# Patient Record
Sex: Female | Born: 1955 | Race: Black or African American | Hispanic: No | Marital: Single | Smoking: Never smoker
Health system: Southern US, Community
[De-identification: ages and names within clinical notes are randomized; demographics above are authoritative.]

## PROBLEM LIST (undated history)

## (undated) DIAGNOSIS — L83 Acanthosis nigricans: Secondary | ICD-10-CM

## (undated) DIAGNOSIS — K5900 Constipation, unspecified: Secondary | ICD-10-CM

## (undated) DIAGNOSIS — F329 Major depressive disorder, single episode, unspecified: Secondary | ICD-10-CM

## (undated) DIAGNOSIS — F3289 Other specified depressive episodes: Secondary | ICD-10-CM

## (undated) DIAGNOSIS — E119 Type 2 diabetes mellitus without complications: Secondary | ICD-10-CM

## (undated) DIAGNOSIS — R5382 Chronic fatigue, unspecified: Secondary | ICD-10-CM

## (undated) DIAGNOSIS — D508 Other iron deficiency anemias: Secondary | ICD-10-CM

## (undated) DIAGNOSIS — R739 Hyperglycemia, unspecified: Secondary | ICD-10-CM

## (undated) DIAGNOSIS — M329 Systemic lupus erythematosus, unspecified: Secondary | ICD-10-CM

## (undated) DIAGNOSIS — B372 Candidiasis of skin and nail: Secondary | ICD-10-CM

## (undated) DIAGNOSIS — K111 Hypertrophy of salivary gland: Secondary | ICD-10-CM

## (undated) DIAGNOSIS — M359 Systemic involvement of connective tissue, unspecified: Secondary | ICD-10-CM

## (undated) DIAGNOSIS — R609 Edema, unspecified: Principal | ICD-10-CM

## (undated) DIAGNOSIS — M6289 Other specified disorders of muscle: Secondary | ICD-10-CM

## (undated) DIAGNOSIS — D568 Other thalassemias: Secondary | ICD-10-CM

## (undated) DIAGNOSIS — R5381 Other malaise: Secondary | ICD-10-CM

## (undated) DIAGNOSIS — R5383 Other fatigue: Secondary | ICD-10-CM

## (undated) DIAGNOSIS — IMO0001 Reserved for inherently not codable concepts without codable children: Secondary | ICD-10-CM

## (undated) DIAGNOSIS — K219 Gastro-esophageal reflux disease without esophagitis: Secondary | ICD-10-CM

## (undated) DIAGNOSIS — M3500 Sicca syndrome, unspecified: Secondary | ICD-10-CM

## (undated) DIAGNOSIS — G43109 Migraine with aura, not intractable, without status migrainosus: Secondary | ICD-10-CM

## (undated) DIAGNOSIS — K6389 Other specified diseases of intestine: Secondary | ICD-10-CM

## (undated) DIAGNOSIS — F3181 Bipolar II disorder: Secondary | ICD-10-CM

## (undated) DIAGNOSIS — I10 Essential (primary) hypertension: Secondary | ICD-10-CM

## (undated) DIAGNOSIS — F319 Bipolar disorder, unspecified: Secondary | ICD-10-CM

## (undated) DIAGNOSIS — D649 Anemia, unspecified: Secondary | ICD-10-CM

## (undated) DIAGNOSIS — D473 Essential (hemorrhagic) thrombocythemia: Secondary | ICD-10-CM

## (undated) DIAGNOSIS — E785 Hyperlipidemia, unspecified: Secondary | ICD-10-CM

## (undated) DIAGNOSIS — IMO0002 Reserved for concepts with insufficient information to code with codable children: Secondary | ICD-10-CM

## (undated) DIAGNOSIS — E559 Vitamin D deficiency, unspecified: Secondary | ICD-10-CM

## (undated) DIAGNOSIS — R768 Other specified abnormal immunological findings in serum: Secondary | ICD-10-CM

## (undated) DIAGNOSIS — K3184 Gastroparesis: Secondary | ICD-10-CM

## (undated) DIAGNOSIS — E042 Nontoxic multinodular goiter: Secondary | ICD-10-CM

## (undated) DIAGNOSIS — Z803 Family history of malignant neoplasm of breast: Secondary | ICD-10-CM

## (undated) DIAGNOSIS — G9332 Myalgic encephalomyelitis/chronic fatigue syndrome: Secondary | ICD-10-CM

## (undated) DIAGNOSIS — G4730 Sleep apnea, unspecified: Secondary | ICD-10-CM

## (undated) DIAGNOSIS — L930 Discoid lupus erythematosus: Secondary | ICD-10-CM

## (undated) DIAGNOSIS — D509 Iron deficiency anemia, unspecified: Secondary | ICD-10-CM

## (undated) DIAGNOSIS — R109 Unspecified abdominal pain: Secondary | ICD-10-CM

## (undated) DIAGNOSIS — Z0000 Encounter for general adult medical examination without abnormal findings: Secondary | ICD-10-CM

## (undated) DIAGNOSIS — N289 Disorder of kidney and ureter, unspecified: Principal | ICD-10-CM

## (undated) HISTORY — DX: Sjogren syndrome, unspecified: M35.00

## (undated) HISTORY — DX: Reserved for inherently not codable concepts without codable children: IMO0001

## (undated) HISTORY — DX: Other specified depressive episodes: F32.89

## (undated) HISTORY — DX: Type 2 diabetes mellitus without complications: E11.9

## (undated) HISTORY — DX: Systemic involvement of connective tissue, unspecified: M35.9

## (undated) HISTORY — DX: Hyperlipidemia, unspecified: E78.5

## (undated) HISTORY — DX: Other thalassemias: D56.8

## (undated) HISTORY — DX: Systemic lupus erythematosus, unspecified: M32.9

## (undated) HISTORY — DX: Gastroparesis: K31.84

## (undated) HISTORY — DX: Disorder of kidney and ureter, unspecified: N28.9

## (undated) HISTORY — DX: Essential (hemorrhagic) thrombocythemia: D47.3

## (undated) HISTORY — DX: Other iron deficiency anemias: D50.8

## (undated) HISTORY — DX: Discoid lupus erythematosus: L93.0

## (undated) HISTORY — DX: Other specified disorders of muscle: M62.89

## (undated) HISTORY — DX: Edema, unspecified: R60.9

## (undated) HISTORY — DX: Bipolar disorder, unspecified: F31.9

## (undated) HISTORY — DX: Vitamin D deficiency, unspecified: E55.9

## (undated) HISTORY — DX: Hypertrophy of salivary gland: K11.1

## (undated) HISTORY — DX: Constipation, unspecified: K59.00

## (undated) HISTORY — DX: Anemia, unspecified: D64.9

## (undated) HISTORY — DX: Sleep apnea, unspecified: G47.30

## (undated) HISTORY — DX: Other specified diseases of intestine: K63.89

## (undated) HISTORY — DX: Reserved for concepts with insufficient information to code with codable children: IMO0002

## (undated) HISTORY — DX: Other malaise: R53.81

## (undated) HISTORY — DX: Family history of malignant neoplasm of breast: Z80.3

## (undated) HISTORY — DX: Hyperglycemia, unspecified: R73.9

## (undated) HISTORY — DX: Candidiasis of skin and nail: B37.2

## (undated) HISTORY — PX: BIOPSY THYROID: PRO38

## (undated) HISTORY — DX: Gastro-esophageal reflux disease without esophagitis: K21.9

## (undated) HISTORY — DX: Major depressive disorder, single episode, unspecified: F32.9

## (undated) HISTORY — DX: Migraine with aura, not intractable, without status migrainosus: G43.109

## (undated) HISTORY — DX: Essential (primary) hypertension: I10

## (undated) HISTORY — DX: Nontoxic multinodular goiter: E04.2

## (undated) HISTORY — DX: Bipolar II disorder: F31.81

## (undated) HISTORY — DX: Acanthosis nigricans: L83

## (undated) HISTORY — DX: Other fatigue: R53.83

## (undated) HISTORY — DX: Encounter for general adult medical examination without abnormal findings: Z00.00

## (undated) HISTORY — DX: Unspecified abdominal pain: R10.9

## (undated) HISTORY — DX: Chronic fatigue, unspecified: R53.82

## (undated) HISTORY — DX: Other specified abnormal immunological findings in serum: R76.8

## (undated) HISTORY — DX: Myalgic encephalomyelitis/chronic fatigue syndrome: G93.32

## (undated) HISTORY — DX: Iron deficiency anemia, unspecified: D50.9

---

## 1998-09-26 ENCOUNTER — Encounter: Admission: RE | Admit: 1998-09-26 | Discharge: 1998-12-25 | Payer: Self-pay | Admitting: Internal Medicine

## 1999-05-03 ENCOUNTER — Other Ambulatory Visit: Admission: RE | Admit: 1999-05-03 | Discharge: 1999-05-03 | Payer: Self-pay | Admitting: Obstetrics and Gynecology

## 1999-11-07 ENCOUNTER — Ambulatory Visit (HOSPITAL_BASED_OUTPATIENT_CLINIC_OR_DEPARTMENT_OTHER): Admission: RE | Admit: 1999-11-07 | Discharge: 1999-11-07 | Payer: Self-pay | Admitting: General Surgery

## 2000-01-26 ENCOUNTER — Encounter: Admission: RE | Admit: 2000-01-26 | Discharge: 2000-01-26 | Payer: Self-pay | Admitting: Family Medicine

## 2000-01-26 ENCOUNTER — Encounter: Payer: Self-pay | Admitting: Family Medicine

## 2000-04-21 ENCOUNTER — Ambulatory Visit (HOSPITAL_BASED_OUTPATIENT_CLINIC_OR_DEPARTMENT_OTHER): Admission: RE | Admit: 2000-04-21 | Discharge: 2000-04-21 | Payer: Self-pay | Admitting: Family Medicine

## 2000-06-03 ENCOUNTER — Other Ambulatory Visit: Admission: RE | Admit: 2000-06-03 | Discharge: 2000-06-03 | Payer: Self-pay | Admitting: Obstetrics and Gynecology

## 2000-06-27 ENCOUNTER — Ambulatory Visit (HOSPITAL_BASED_OUTPATIENT_CLINIC_OR_DEPARTMENT_OTHER): Admission: RE | Admit: 2000-06-27 | Discharge: 2000-06-27 | Payer: Self-pay | Admitting: Family Medicine

## 2000-07-03 ENCOUNTER — Encounter: Payer: Self-pay | Admitting: Obstetrics and Gynecology

## 2000-07-03 ENCOUNTER — Ambulatory Visit (HOSPITAL_COMMUNITY): Admission: RE | Admit: 2000-07-03 | Discharge: 2000-07-03 | Payer: Self-pay | Admitting: Obstetrics and Gynecology

## 2000-11-15 ENCOUNTER — Encounter: Payer: Self-pay | Admitting: Family Medicine

## 2000-11-15 ENCOUNTER — Ambulatory Visit (HOSPITAL_COMMUNITY): Admission: RE | Admit: 2000-11-15 | Discharge: 2000-11-15 | Payer: Self-pay | Admitting: Family Medicine

## 2001-01-27 ENCOUNTER — Encounter: Payer: Self-pay | Admitting: Family Medicine

## 2001-01-27 ENCOUNTER — Encounter: Admission: RE | Admit: 2001-01-27 | Discharge: 2001-01-27 | Payer: Self-pay | Admitting: Family Medicine

## 2001-12-31 ENCOUNTER — Other Ambulatory Visit: Admission: RE | Admit: 2001-12-31 | Discharge: 2001-12-31 | Payer: Self-pay | Admitting: Obstetrics and Gynecology

## 2002-01-28 ENCOUNTER — Encounter: Payer: Self-pay | Admitting: Obstetrics and Gynecology

## 2002-01-28 ENCOUNTER — Encounter: Admission: RE | Admit: 2002-01-28 | Discharge: 2002-01-28 | Payer: Self-pay | Admitting: Obstetrics and Gynecology

## 2002-08-13 HISTORY — PX: LAPAROSCOPIC TOTAL HYSTERECTOMY: SUR800

## 2003-01-21 ENCOUNTER — Encounter (INDEPENDENT_AMBULATORY_CARE_PROVIDER_SITE_OTHER): Payer: Self-pay

## 2003-01-21 ENCOUNTER — Inpatient Hospital Stay (HOSPITAL_COMMUNITY): Admission: RE | Admit: 2003-01-21 | Discharge: 2003-01-23 | Payer: Self-pay | Admitting: Obstetrics and Gynecology

## 2003-02-04 ENCOUNTER — Encounter: Payer: Self-pay | Admitting: Obstetrics and Gynecology

## 2003-02-04 ENCOUNTER — Encounter: Admission: RE | Admit: 2003-02-04 | Discharge: 2003-02-04 | Payer: Self-pay | Admitting: Obstetrics and Gynecology

## 2003-02-08 ENCOUNTER — Encounter: Admission: RE | Admit: 2003-02-08 | Discharge: 2003-02-08 | Payer: Self-pay | Admitting: Obstetrics and Gynecology

## 2003-02-08 ENCOUNTER — Encounter: Payer: Self-pay | Admitting: Obstetrics and Gynecology

## 2004-02-10 ENCOUNTER — Other Ambulatory Visit: Admission: RE | Admit: 2004-02-10 | Discharge: 2004-02-10 | Payer: Self-pay | Admitting: Obstetrics and Gynecology

## 2004-02-25 ENCOUNTER — Encounter: Admission: RE | Admit: 2004-02-25 | Discharge: 2004-02-25 | Payer: Self-pay | Admitting: Obstetrics and Gynecology

## 2005-01-31 ENCOUNTER — Encounter: Admission: RE | Admit: 2005-01-31 | Discharge: 2005-01-31 | Payer: Self-pay | Admitting: Neurology

## 2005-03-06 ENCOUNTER — Encounter: Admission: RE | Admit: 2005-03-06 | Discharge: 2005-03-06 | Payer: Self-pay | Admitting: Obstetrics and Gynecology

## 2005-10-01 ENCOUNTER — Encounter: Payer: Self-pay | Admitting: Family Medicine

## 2005-10-01 LAB — CONVERTED CEMR LAB
Alkaline Phosphatase: 141 units/L
Basophils Absolute: 0.1 10*3/uL
Basophils Relative: 1.2 %
Cholesterol: 168 mg/dL
Creatinine, Ser: 0.9 mg/dL
Direct LDL: 114 mg/dL
Glucose, Bld: 97 mg/dL
HDL: 40 mg/dL
Hemoglobin: 11.4 g/dL
Lymphocytes Relative: 30.3 %
Monocytes Absolute: 0.7 10*3/uL
Neutro Abs: 5.2 10*3/uL
Neutrophils Relative %: 59.5 %
Platelets: 526 10*3/uL
Potassium: 4.1 meq/L
RDW: 16.8 %
Sed Rate: 46 mm/hr
Sodium: 140 meq/L
Total Bilirubin: 0.6 mg/dL
Total CHOL/HDL Ratio: 4.2
Total Protein: 6.8 g/dL

## 2006-02-14 ENCOUNTER — Ambulatory Visit: Payer: Self-pay | Admitting: Hematology & Oncology

## 2006-02-25 LAB — CBC & DIFF AND RETIC
BASO%: 0.3 % (ref 0.0–2.0)
HCT: 34.7 % — ABNORMAL LOW (ref 34.8–46.6)
IRF: 0.36 — ABNORMAL HIGH (ref 0.130–0.330)
MCHC: 32.2 g/dL (ref 32.0–36.0)
MONO#: 0.5 10*3/uL (ref 0.1–0.9)
NEUT#: 5.7 10*3/uL (ref 1.5–6.5)
RBC: 4.64 10*6/uL (ref 3.70–5.32)
Retic %: 1.3 % (ref 0.4–2.3)
WBC: 8.7 10*3/uL (ref 3.9–10.0)
lymph#: 2.3 10*3/uL (ref 0.9–3.3)

## 2006-02-25 LAB — CHCC SMEAR

## 2006-02-27 LAB — FERRITIN: Ferritin: 28 ng/mL (ref 10–291)

## 2006-03-07 ENCOUNTER — Encounter: Admission: RE | Admit: 2006-03-07 | Discharge: 2006-03-07 | Payer: Self-pay | Admitting: Obstetrics and Gynecology

## 2006-04-12 ENCOUNTER — Ambulatory Visit: Payer: Self-pay | Admitting: Hematology & Oncology

## 2006-04-17 LAB — CBC & DIFF AND RETIC
BASO%: 0.4 % (ref 0.0–2.0)
HCT: 33.8 % — ABNORMAL LOW (ref 34.8–46.6)
LYMPH%: 27.7 % (ref 14.0–48.0)
MCH: 25 pg — ABNORMAL LOW (ref 26.0–34.0)
MCHC: 32.5 g/dL (ref 32.0–36.0)
MCV: 76.8 fL — ABNORMAL LOW (ref 81.0–101.0)
MONO#: 0.5 10*3/uL (ref 0.1–0.9)
MONO%: 5.6 % (ref 0.0–13.0)
NEUT%: 64.3 % (ref 39.6–76.8)
Platelets: 517 10*3/uL — ABNORMAL HIGH (ref 145–400)
WBC: 8.4 10*3/uL (ref 3.9–10.0)

## 2006-04-17 LAB — CHCC SMEAR

## 2006-04-20 LAB — FERRITIN: Ferritin: 544 ng/mL — ABNORMAL HIGH (ref 10–291)

## 2006-08-23 ENCOUNTER — Encounter: Admission: RE | Admit: 2006-08-23 | Discharge: 2006-08-23 | Payer: Self-pay | Admitting: Orthopedic Surgery

## 2006-11-07 ENCOUNTER — Encounter: Payer: Self-pay | Admitting: Family Medicine

## 2006-11-07 LAB — CONVERTED CEMR LAB
ALT: 23 units/L
Albumin: 3.7 g/dL
BUN: 6 mg/dL
Basophils Absolute: 0.1 10*3/uL
CO2: 33 meq/L
Calcium: 9.6 mg/dL
Chloride: 104 meq/L
Eosinophils Relative: 1.4 %
Glucose, Bld: 129 mg/dL
Hgb A1c MFr Bld: 6.7 %
Lymphocytes Relative: 30.2 %
Neutro Abs: 4.5 10*3/uL
Neutrophils Relative %: 63.1 %
Platelets: 467 10*3/uL
RDW: 14.6 %
Total Protein: 6.5 g/dL

## 2007-02-01 ENCOUNTER — Encounter: Payer: Self-pay | Admitting: Family Medicine

## 2007-02-05 ENCOUNTER — Encounter: Payer: Self-pay | Admitting: Family Medicine

## 2007-02-05 ENCOUNTER — Encounter: Admission: RE | Admit: 2007-02-05 | Discharge: 2007-02-05 | Payer: Self-pay | Admitting: Orthopedic Surgery

## 2007-02-06 ENCOUNTER — Encounter: Payer: Self-pay | Admitting: Family Medicine

## 2007-02-11 ENCOUNTER — Encounter: Admission: RE | Admit: 2007-02-11 | Discharge: 2007-02-11 | Payer: Self-pay | Admitting: Family Medicine

## 2007-02-12 ENCOUNTER — Encounter: Payer: Self-pay | Admitting: Family Medicine

## 2007-03-11 ENCOUNTER — Encounter: Admission: RE | Admit: 2007-03-11 | Discharge: 2007-03-11 | Payer: Self-pay | Admitting: Obstetrics and Gynecology

## 2007-04-01 ENCOUNTER — Encounter: Payer: Self-pay | Admitting: Family Medicine

## 2007-04-01 ENCOUNTER — Other Ambulatory Visit: Admission: RE | Admit: 2007-04-01 | Discharge: 2007-04-01 | Payer: Self-pay | Admitting: Interventional Radiology

## 2007-04-01 ENCOUNTER — Encounter: Admission: RE | Admit: 2007-04-01 | Discharge: 2007-04-01 | Payer: Self-pay | Admitting: Surgery

## 2007-04-01 ENCOUNTER — Encounter (INDEPENDENT_AMBULATORY_CARE_PROVIDER_SITE_OTHER): Payer: Self-pay | Admitting: Interventional Radiology

## 2007-04-10 ENCOUNTER — Encounter: Payer: Self-pay | Admitting: Family Medicine

## 2007-04-10 LAB — CONVERTED CEMR LAB
ALT: 22 units/L
AST: 19 units/L
CO2: 30 meq/L
Chloride: 105 meq/L
GFR calc Af Amer: 75.93 mL/min
Glucose, Bld: 119 mg/dL
Potassium: 4.7 meq/L
Total Bilirubin: 0.7 mg/dL
Total Protein: 6.6 g/dL

## 2007-04-16 ENCOUNTER — Encounter: Payer: Self-pay | Admitting: Family Medicine

## 2007-08-15 ENCOUNTER — Encounter: Payer: Self-pay | Admitting: Family Medicine

## 2007-08-15 ENCOUNTER — Encounter: Admission: RE | Admit: 2007-08-15 | Discharge: 2007-08-15 | Payer: Self-pay | Admitting: Family Medicine

## 2007-10-08 ENCOUNTER — Encounter: Payer: Self-pay | Admitting: Family Medicine

## 2007-10-08 LAB — CONVERTED CEMR LAB
ALT: 20 units/L
AST: 16 units/L
Anion Gap: 14.6
Basophils Absolute: 0.1 10*3/uL
Basophils Relative: 0.7 %
CO2: 30 meq/L
Calcium: 9.6 mg/dL
Chloride: 103 meq/L
Eosinophils Relative: 1.5 %
GFR calc Af Amer: 88.57 mL/min
Glucose, Bld: 111 mg/dL
Hemoglobin: 12.6 g/dL
MCHC: 32.4 g/dL
Monocytes Absolute: 0.4 10*3/uL
Neutro Abs: 5.1 10*3/uL
Potassium: 4.6 meq/L
RDW: 14.6 %
TSH: 1.03 microintl units/mL
Total CHOL/HDL Ratio: 4.09
Total Protein: 6.6 g/dL

## 2007-12-24 ENCOUNTER — Encounter: Payer: Self-pay | Admitting: Family Medicine

## 2008-01-28 ENCOUNTER — Encounter: Payer: Self-pay | Admitting: Family Medicine

## 2008-01-28 LAB — CONVERTED CEMR LAB
AST: 16 units/L
Calcium: 9.3 mg/dL
Chloride: 106 meq/L
Direct LDL: 134 mg/dL
GFR calc Af Amer: 105.82 mL/min
GFR calc non Af Amer: 128.04 mL/min
Glucose, Bld: 121 mg/dL
HDL: 52 mg/dL
Hgb A1c MFr Bld: 6.7 %
Potassium: 4.8 meq/L
Sodium: 143 meq/L
Total Bilirubin: 0.7 mg/dL
Total CHOL/HDL Ratio: 3.87

## 2008-02-17 ENCOUNTER — Encounter: Payer: Self-pay | Admitting: Family Medicine

## 2008-02-17 LAB — HM COLONOSCOPY

## 2008-03-11 ENCOUNTER — Encounter: Admission: RE | Admit: 2008-03-11 | Discharge: 2008-03-11 | Payer: Self-pay | Admitting: Obstetrics and Gynecology

## 2008-03-12 LAB — HM COLONOSCOPY

## 2008-03-19 ENCOUNTER — Encounter: Admission: RE | Admit: 2008-03-19 | Discharge: 2008-03-19 | Payer: Self-pay | Admitting: Obstetrics and Gynecology

## 2008-04-28 ENCOUNTER — Encounter: Payer: Self-pay | Admitting: Family Medicine

## 2008-04-28 LAB — CONVERTED CEMR LAB
BUN: 10 mg/dL
Basophils Absolute: 0.1 10*3/uL
Calcium: 9.3 mg/dL
Chloride: 102 meq/L
Cholesterol: 170 mg/dL
Direct LDL: 112 mg/dL
Eosinophils Relative: 1.6 %
GFR calc Af Amer: 75.62 mL/min
GFR calc non Af Amer: 91.5 mL/min
Glucose, Bld: 104 mg/dL
HCT: 37.2 %
HDL: 49 mg/dL
Hemoglobin: 11.9 g/dL
Hgb A1c MFr Bld: 7.1 %
Lymphocytes Relative: 27.8 %
Lymphs Abs: 2.8 10*3/uL
Monocytes Absolute: 0.6 10*3/uL
Monocytes Relative: 5.7 %
Neutro Abs: 6.5 10*3/uL
RBC: 4.7 M/uL
Total Bilirubin: 0.6 mg/dL
Total CHOL/HDL Ratio: 3.47
WBC: 10.2 10*3/uL

## 2008-08-10 ENCOUNTER — Encounter: Payer: Self-pay | Admitting: Family Medicine

## 2008-08-10 LAB — CONVERTED CEMR LAB
AST: 14 units/L
Albumin: 3.9 g/dL
Alkaline Phosphatase: 137 units/L
Anion Gap: 12.5
Basophils Relative: 0.4 %
CO2: 29 meq/L
Eosinophils Absolute: 0.3 10*3/uL
MCHC: 31.9 g/dL
MCV: 79.6 fL
Monocytes Relative: 5.5 %
Neutrophils Relative %: 67.4 %
Platelets: 416 10*3/uL
Potassium: 4.5 meq/L
Sodium: 141 meq/L
Total Protein: 6.8 g/dL
Triglycerides: 192 mg/dL

## 2008-08-24 ENCOUNTER — Encounter: Payer: Self-pay | Admitting: Family Medicine

## 2008-08-24 ENCOUNTER — Encounter: Admission: RE | Admit: 2008-08-24 | Discharge: 2008-08-24 | Payer: Self-pay | Admitting: Family Medicine

## 2008-09-16 ENCOUNTER — Encounter (INDEPENDENT_AMBULATORY_CARE_PROVIDER_SITE_OTHER): Payer: Self-pay | Admitting: Interventional Radiology

## 2008-09-16 ENCOUNTER — Other Ambulatory Visit: Admission: RE | Admit: 2008-09-16 | Discharge: 2008-09-16 | Payer: Self-pay | Admitting: Interventional Radiology

## 2008-09-16 ENCOUNTER — Encounter: Admission: RE | Admit: 2008-09-16 | Discharge: 2008-09-16 | Payer: Self-pay | Admitting: Family Medicine

## 2008-09-16 ENCOUNTER — Encounter: Payer: Self-pay | Admitting: Family Medicine

## 2008-10-26 ENCOUNTER — Encounter: Payer: Self-pay | Admitting: Family Medicine

## 2008-10-26 LAB — CONVERTED CEMR LAB: TSH: 1.16 microintl units/mL

## 2008-11-10 ENCOUNTER — Encounter: Payer: Self-pay | Admitting: Family Medicine

## 2008-11-10 LAB — CONVERTED CEMR LAB
AST: 15 units/L
Anion Gap: 12
BUN: 8 mg/dL
Direct LDL: 131 mg/dL
Glucose, Bld: 100 mg/dL
Hemoglobin: 12.7 g/dL
Potassium: 5 meq/L
Sodium: 141 meq/L
Total Protein: 6.9 g/dL

## 2009-02-08 ENCOUNTER — Encounter: Payer: Self-pay | Admitting: Family Medicine

## 2009-02-08 LAB — CONVERTED CEMR LAB

## 2009-02-16 ENCOUNTER — Encounter: Payer: Self-pay | Admitting: Family Medicine

## 2009-03-08 ENCOUNTER — Encounter: Payer: Self-pay | Admitting: Family Medicine

## 2009-03-10 ENCOUNTER — Encounter: Payer: Self-pay | Admitting: Family Medicine

## 2009-03-12 ENCOUNTER — Encounter: Payer: Self-pay | Admitting: Family Medicine

## 2009-03-14 ENCOUNTER — Encounter: Admission: RE | Admit: 2009-03-14 | Discharge: 2009-03-14 | Payer: Self-pay | Admitting: Obstetrics and Gynecology

## 2009-03-14 ENCOUNTER — Encounter: Payer: Self-pay | Admitting: Family Medicine

## 2009-03-24 ENCOUNTER — Encounter: Payer: Self-pay | Admitting: Family Medicine

## 2009-03-24 ENCOUNTER — Encounter: Admission: RE | Admit: 2009-03-24 | Discharge: 2009-03-24 | Payer: Self-pay | Admitting: Internal Medicine

## 2009-05-23 ENCOUNTER — Encounter: Payer: Self-pay | Admitting: Family Medicine

## 2009-06-07 ENCOUNTER — Encounter: Payer: Self-pay | Admitting: Family Medicine

## 2009-06-07 LAB — CONVERTED CEMR LAB
ALT: 28 units/L
AST: 20 units/L
Microalb, Ur: 2.6 mg/dL
Triglycerides: 187 mg/dL

## 2009-06-28 ENCOUNTER — Encounter: Payer: Self-pay | Admitting: Family Medicine

## 2009-06-28 LAB — CONVERTED CEMR LAB
Glucose, Bld: 137 mg/dL
Microalb, Ur: 2.6 mg/dL

## 2009-07-13 ENCOUNTER — Encounter: Payer: Self-pay | Admitting: Family Medicine

## 2009-08-01 ENCOUNTER — Ambulatory Visit (HOSPITAL_COMMUNITY): Admission: RE | Admit: 2009-08-01 | Discharge: 2009-08-01 | Payer: Self-pay | Admitting: Rheumatology

## 2009-08-01 ENCOUNTER — Encounter: Payer: Self-pay | Admitting: Family Medicine

## 2009-08-10 ENCOUNTER — Encounter: Payer: Self-pay | Admitting: Family Medicine

## 2009-08-10 LAB — CONVERTED CEMR LAB
Albumin: 3.9 g/dL
Alkaline Phosphatase: 137 units/L
Anion Gap: 12.5
Basophils Absolute: 0 10*3/uL
Basophils Relative: 0.4 %
CO2: 29 meq/L
Cholesterol: 221 mg/dL
Creatinine, Ser: 0.7 mg/dL
Eosinophils Absolute: 0.3 10*3/uL
MCHC: 31.9 g/dL
MCV: 79.6 fL
Neutrophils Relative %: 67.4 %
Platelets: 416 10*3/uL
RDW: 15.1 %
Sodium: 141 meq/L
Total Protein: 6.8 g/dL
Triglycerides: 192 mg/dL
WBC: 8.6 10*3/uL

## 2009-08-13 DIAGNOSIS — K111 Hypertrophy of salivary gland: Secondary | ICD-10-CM

## 2009-08-13 HISTORY — DX: Hypertrophy of salivary gland: K11.1

## 2009-08-31 ENCOUNTER — Encounter: Payer: Self-pay | Admitting: Family Medicine

## 2009-08-31 LAB — CONVERTED CEMR LAB
Hgb A1c MFr Bld: 6.4 %
Hgb A1c MFr Bld: 6.4 %
Microalb, Ur: 1.4 mg/dL

## 2009-09-09 ENCOUNTER — Encounter: Payer: Self-pay | Admitting: Family Medicine

## 2009-09-09 LAB — CONVERTED CEMR LAB: Microalb, Ur: 1.4 mg/dL

## 2009-09-28 ENCOUNTER — Encounter: Payer: Self-pay | Admitting: Family Medicine

## 2009-09-28 LAB — HM DIABETES EYE EXAM: HM Diabetic Eye Exam: NORMAL

## 2009-10-13 ENCOUNTER — Encounter: Payer: Self-pay | Admitting: Family Medicine

## 2009-11-17 ENCOUNTER — Encounter: Payer: Self-pay | Admitting: Family Medicine

## 2009-11-24 ENCOUNTER — Encounter: Payer: Self-pay | Admitting: Family Medicine

## 2009-12-22 ENCOUNTER — Encounter: Payer: Self-pay | Admitting: Family Medicine

## 2009-12-22 LAB — CONVERTED CEMR LAB
AST: 14 units/L
Alkaline Phosphatase: 125 units/L
Calcium: 9.3 mg/dL
Chloride: 101 meq/L
Creatinine, Ser: 0.7 mg/dL
Direct LDL: 95 mg/dL
GFR calc Af Amer: 106.33 mL/min
GFR calc non Af Amer: 87.87 mL/min
HDL: 37 mg/dL
Hgb A1c MFr Bld: 5.7 %
Microalb, Ur: 3.1 mg/dL
Microalb, Ur: 3.1 mg/dL
Total CHOL/HDL Ratio: 4.05
Total CHOL/HDL Ratio: 4.05
Total Protein: 6.7 g/dL
Triglycerides: 107 mg/dL

## 2010-01-10 ENCOUNTER — Encounter: Payer: Self-pay | Admitting: Family Medicine

## 2010-01-11 ENCOUNTER — Encounter: Payer: Self-pay | Admitting: Family Medicine

## 2010-01-12 ENCOUNTER — Encounter: Payer: Self-pay | Admitting: Family Medicine

## 2010-01-18 ENCOUNTER — Encounter: Payer: Self-pay | Admitting: Family Medicine

## 2010-01-18 LAB — CONVERTED CEMR LAB
TSH: 0.95 microintl units/mL
TSH: 0.95 microintl units/mL

## 2010-01-31 ENCOUNTER — Encounter: Payer: Self-pay | Admitting: Family Medicine

## 2010-01-31 DIAGNOSIS — E119 Type 2 diabetes mellitus without complications: Secondary | ICD-10-CM | POA: Insufficient documentation

## 2010-01-31 DIAGNOSIS — D568 Other thalassemias: Secondary | ICD-10-CM

## 2010-01-31 DIAGNOSIS — R5383 Other fatigue: Secondary | ICD-10-CM

## 2010-01-31 DIAGNOSIS — D509 Iron deficiency anemia, unspecified: Secondary | ICD-10-CM

## 2010-01-31 DIAGNOSIS — F3289 Other specified depressive episodes: Secondary | ICD-10-CM | POA: Insufficient documentation

## 2010-01-31 DIAGNOSIS — I10 Essential (primary) hypertension: Secondary | ICD-10-CM | POA: Insufficient documentation

## 2010-01-31 DIAGNOSIS — R5381 Other malaise: Secondary | ICD-10-CM

## 2010-01-31 DIAGNOSIS — IMO0002 Reserved for concepts with insufficient information to code with codable children: Secondary | ICD-10-CM

## 2010-01-31 DIAGNOSIS — D508 Other iron deficiency anemias: Secondary | ICD-10-CM | POA: Insufficient documentation

## 2010-01-31 DIAGNOSIS — L83 Acanthosis nigricans: Secondary | ICD-10-CM

## 2010-01-31 DIAGNOSIS — M797 Fibromyalgia: Secondary | ICD-10-CM

## 2010-01-31 DIAGNOSIS — G4730 Sleep apnea, unspecified: Secondary | ICD-10-CM | POA: Insufficient documentation

## 2010-01-31 DIAGNOSIS — G43109 Migraine with aura, not intractable, without status migrainosus: Secondary | ICD-10-CM | POA: Insufficient documentation

## 2010-01-31 DIAGNOSIS — F3181 Bipolar II disorder: Secondary | ICD-10-CM

## 2010-01-31 DIAGNOSIS — E782 Mixed hyperlipidemia: Secondary | ICD-10-CM

## 2010-01-31 DIAGNOSIS — F329 Major depressive disorder, single episode, unspecified: Secondary | ICD-10-CM

## 2010-01-31 DIAGNOSIS — E559 Vitamin D deficiency, unspecified: Secondary | ICD-10-CM | POA: Insufficient documentation

## 2010-01-31 DIAGNOSIS — K5900 Constipation, unspecified: Secondary | ICD-10-CM | POA: Insufficient documentation

## 2010-01-31 HISTORY — DX: Iron deficiency anemia, unspecified: D50.9

## 2010-01-31 HISTORY — DX: Bipolar II disorder: F31.81

## 2010-02-08 ENCOUNTER — Encounter (INDEPENDENT_AMBULATORY_CARE_PROVIDER_SITE_OTHER): Payer: Self-pay | Admitting: *Deleted

## 2010-02-10 ENCOUNTER — Encounter: Payer: Self-pay | Admitting: Family Medicine

## 2010-02-14 ENCOUNTER — Encounter: Payer: Self-pay | Admitting: Family Medicine

## 2010-02-14 LAB — CONVERTED CEMR LAB: Sed Rate: 43 mm/hr

## 2010-02-15 ENCOUNTER — Ambulatory Visit: Payer: Self-pay | Admitting: Family Medicine

## 2010-02-15 ENCOUNTER — Telehealth: Payer: Self-pay | Admitting: Family Medicine

## 2010-02-15 DIAGNOSIS — M329 Systemic lupus erythematosus, unspecified: Secondary | ICD-10-CM | POA: Insufficient documentation

## 2010-02-16 ENCOUNTER — Encounter: Payer: Self-pay | Admitting: Family Medicine

## 2010-02-27 ENCOUNTER — Encounter: Payer: Self-pay | Admitting: Family Medicine

## 2010-03-08 ENCOUNTER — Encounter: Admission: RE | Admit: 2010-03-08 | Discharge: 2010-03-08 | Payer: Self-pay | Admitting: Internal Medicine

## 2010-03-09 ENCOUNTER — Encounter: Payer: Self-pay | Admitting: Family Medicine

## 2010-03-16 ENCOUNTER — Encounter: Admission: RE | Admit: 2010-03-16 | Discharge: 2010-03-16 | Payer: Self-pay | Admitting: Family Medicine

## 2010-05-15 ENCOUNTER — Ambulatory Visit: Payer: Self-pay | Admitting: Family Medicine

## 2010-05-15 ENCOUNTER — Telehealth: Payer: Self-pay | Admitting: Family Medicine

## 2010-05-15 DIAGNOSIS — L290 Pruritus ani: Secondary | ICD-10-CM

## 2010-05-16 LAB — CONVERTED CEMR LAB
ALT: 18 units/L (ref 0–35)
Hgb A1c MFr Bld: 6.5 % (ref 4.6–6.5)
Microalb, Ur: 2.1 mg/dL — ABNORMAL HIGH (ref 0.0–1.9)
Total Protein: 6.8 g/dL (ref 6.0–8.3)

## 2010-05-23 ENCOUNTER — Telehealth: Payer: Self-pay | Admitting: Gastroenterology

## 2010-05-26 ENCOUNTER — Encounter: Payer: Self-pay | Admitting: Family Medicine

## 2010-05-26 ENCOUNTER — Ambulatory Visit: Payer: Self-pay | Admitting: Gastroenterology

## 2010-05-29 ENCOUNTER — Ambulatory Visit: Payer: Self-pay | Admitting: Family

## 2010-05-29 ENCOUNTER — Telehealth: Payer: Self-pay | Admitting: Family Medicine

## 2010-05-29 ENCOUNTER — Telehealth: Payer: Self-pay | Admitting: Family

## 2010-05-29 DIAGNOSIS — J019 Acute sinusitis, unspecified: Secondary | ICD-10-CM

## 2010-07-03 ENCOUNTER — Encounter: Payer: Self-pay | Admitting: Gastroenterology

## 2010-08-03 ENCOUNTER — Telehealth (INDEPENDENT_AMBULATORY_CARE_PROVIDER_SITE_OTHER): Payer: Self-pay | Admitting: *Deleted

## 2010-08-03 ENCOUNTER — Encounter: Payer: Self-pay | Admitting: Family Medicine

## 2010-08-15 ENCOUNTER — Encounter: Payer: Self-pay | Admitting: Family Medicine

## 2010-08-15 LAB — CONVERTED CEMR LAB: Sed Rate: 33 mm/hr

## 2010-08-16 ENCOUNTER — Ambulatory Visit
Admission: RE | Admit: 2010-08-16 | Discharge: 2010-08-16 | Payer: Self-pay | Source: Home / Self Care | Attending: Gastroenterology | Admitting: Gastroenterology

## 2010-08-17 ENCOUNTER — Encounter: Payer: Self-pay | Admitting: Family Medicine

## 2010-08-18 ENCOUNTER — Other Ambulatory Visit: Payer: Self-pay | Admitting: Family Medicine

## 2010-08-18 ENCOUNTER — Ambulatory Visit
Admission: RE | Admit: 2010-08-18 | Discharge: 2010-08-18 | Payer: Self-pay | Source: Home / Self Care | Attending: Family Medicine | Admitting: Family Medicine

## 2010-08-18 DIAGNOSIS — M25569 Pain in unspecified knee: Secondary | ICD-10-CM | POA: Insufficient documentation

## 2010-08-18 LAB — HEMOGLOBIN A1C: Hgb A1c MFr Bld: 6.1 % (ref 4.6–6.5)

## 2010-08-23 ENCOUNTER — Encounter: Payer: Self-pay | Admitting: Family Medicine

## 2010-09-04 ENCOUNTER — Encounter: Payer: Self-pay | Admitting: Obstetrics and Gynecology

## 2010-09-07 ENCOUNTER — Encounter: Payer: Self-pay | Admitting: Speech Pathology

## 2010-09-10 LAB — CONVERTED CEMR LAB
ALT: 23 units/L
AST: 19 units/L
AST: 20 units/L
Albumin: 3.8 g/dL
Albumin: 3.9 g/dL
Albumin: 4 g/dL
Alkaline Phosphatase: 135 units/L
Alkaline Phosphatase: 144 units/L
Alkaline Phosphatase: 153 units/L
Anion Gap: 14.5
BUN: 10 mg/dL
BUN: 8 mg/dL
Basophils Relative: 0.7 %
Basophils Relative: 0.9 %
CO2: 30 meq/L
Calcium: 9.4 mg/dL
Calcium: 9.6 mg/dL
Creatinine, Ser: 0.85 mg/dL
Creatinine, Ser: 0.9 mg/dL
Eosinophils Absolute: 0.1 10*3/uL
Eosinophils Absolute: 0.1 10*3/uL
Eosinophils Relative: 0.7 %
Eosinophils Relative: 0.9 %
Eosinophils Relative: 1.3 %
Free T4: 0.68 ng/dL
GFR calc Af Amer: 106.33 mL/min
GFR calc Af Amer: 130.6 mL/min
GFR calc non Af Amer: 158.02 mL/min
Glucose, Bld: 136 mg/dL
Glucose, Bld: 190 mg/dL
Glucose, Bld: 96 mg/dL
HCT: 37.3 %
HCT: 37.4 %
HCT: 38.9 %
HCV Ab: <0.1
HDL: 53 mg/dL
Hep B E Ag: NEGATIVE
Hgb A1c MFr Bld: 7.3 %
Iron: 74 ug/dL
Lymphocytes Relative: 29.8 %
Lymphs Abs: 1.8 10*3/uL
Lymphs Abs: 2.1 10*3/uL
MCV: 78.6 fL
MCV: 80.6 fL
Monocytes Absolute: 0.4 10*3/uL
Monocytes Absolute: 0.4 10*3/uL
Monocytes Relative: 4.4 %
Monocytes Relative: 5.6 %
Neutro Abs: 4.6 10*3/uL
Neutro Abs: 6.6 10*3/uL
Neutrophils Relative %: 63.6 %
Neutrophils Relative %: 65 %
Platelets: 429 10*3/uL
Platelets: 497 10*3/uL
Potassium: 3.8 meq/L
RBC: 4.63 M/uL
RBC: 4.65 M/uL
RBC: 4.95 M/uL
RDW: 13.9 %
RDW: 14.8 %
Saturation Ratios: 22 %
Sed Rate: 17 mm/hr
Sed Rate: 56 mm/hr
Sodium: 139 meq/L
Sodium: 142 meq/L
TSH: 1.08 microintl units/mL
TSH: 1.66 microintl units/mL
Total Bilirubin: 0.5 mg/dL
Total CHOL/HDL Ratio: 3.94
Total Protein: 6.9 g/dL
WBC: 7 10*3/uL
WBC: 9.3 10*3/uL

## 2010-09-14 NOTE — Letter (Signed)
Summary: Psychiatry-Dr. Andee Poles  Psychiatry-Dr. [facility]   Imported By: Maryln Gottron 02/21/2010 10:39:28  _____________________________________________________________________  External Attachment:    Type:   Image     Comment:   External Document

## 2010-09-14 NOTE — Progress Notes (Signed)
Summary: Levaquin dosage change  Phone Note From Pharmacy Call back at [phone]   Caller: Blair Dolphin. # [phone]* Summary of Call: Only has 500 MG of Levaquin, can script be changed to 1 1/2 tablets per day? Pt is waiting, lives too far away to leave & go back Initial call taken by: Lannette Donath,  May 29, 2010 11:11 AM  Follow-up for Phone Call        [facility] to change Levaquin strength to 500mg  1 1/2 tablets daily. Authorization given to [place] at [place]. Nicki Guadalajara Fergerson CMA Duncan Dull)  May 29, 2010 12:10 PM

## 2010-09-14 NOTE — Miscellaneous (Signed)
  Clinical Lists Changes  Observations: Added new observation of ESR: 33 mm/hr (08/15/2010 10:56) Added new observation of CRPCARDRISK: 0.6 mg/L (08/15/2010 10:56) Added new observation of SGPT (ALT): 20 units/L (08/15/2010 10:56) Added new observation of SGOT (AST): 14 units/L (08/15/2010 10:56) Added new observation of ALK PHOS: 116 units/L (08/15/2010 10:56) Added new observation of CREATININE: 0.79 mg/dL (16/05/9603 54:09) Added new observation of BG RANDOM: 99 mg/dL (81/19/1478 29:56) Added new observation of ESR: 43 mm/hr (02/14/2010 10:56)

## 2010-09-14 NOTE — Letter (Signed)
Summary: Dermatology Patient Letter/[facility]  Dermatology Patient Letter/[facility]   Imported By: Maryln Gottron 02/21/2010 10:31:38  _____________________________________________________________________  External Attachment:    Type:   Image     Comment:   External Document

## 2010-09-14 NOTE — Miscellaneous (Signed)
  Clinical Lists Changes 

## 2010-09-14 NOTE — Assessment & Plan Note (Signed)
Summary: FOLLOW UP / LFW   Vital Signs:  Patient profile:   55 year old female Height:      65.5 inches Weight:      164.75 pounds BMI:     27.10 Temp:     98.4 degrees F oral Pulse rate:   84 / minute Pulse rhythm:   regular BP sitting:   112 / 66  (left arm) Cuff size:   regular  Vitals Entered By: Delilah Shan CMA Jeffrey Voth Dull) (August 18, 2010 10:31 AM) CC: 3 months follow up   History of Present Illness: Fibromyalgia pain has increased/flared over the last few months.  Had follow up with Nickola Major (rheum) and "it's been a rough 3 months."  Has been back in the bed with decrease in activity level.  Was seen in 10/11 for sinusitis and eventually got over this.  She had been exerting herself before this and she thinks this was related.    The dry mouth has increased in the meantime per patient.  Still with some blurry vision and had follow up with eye clinic.  Had normal exam and was told that it wasn't related to DM or glaucoma.    Occ R knee pain and R foot pain.  Podiatry eval this Wednesday and was injected (nonsteroid per patient) with some relief.  Has orthotics.    Has follow up with Dr. Sharl Ma re:DM and adrenal studies.  Due for A1c.    Current Medications (verified): 1)  Norvasc 10 Mg Tabs (Amlodipine Besylate) .... Take 1 Tablet By Mouth Once A Day 2)  Diovan 80 Mg Tabs (Valsartan) .... Take 1 Tablet By Mouth Once A Day 3)  Januvia 100 Mg Tabs (Sitagliptin Phosphate) .... Take 1 Tablet By Mouth Once A Day 4)  Ambien 10 Mg Tabs (Zolpidem Tartrate) .... Take 1 Tablet By Mouth Once A Day As Needed For Trouble Sleeping 5)  Benadryl 25 Mg Tabs (Diphenhydramine Hcl) .... As Needed For Allergy Symptoms 6)  Vitamin D 1000 Unit  Tabs (Cholecalciferol) .... Once Daily 7)  Cpap .... As Directed At Bedtime 8)  Onetouch Ultra Control  Soln (Blood Glucose Calibration) .... As Directed. 9)  Desonate 0.05 % Gel (Desonide) .... One Application To Affected Area Twice Daily 10)   Ketoconazole 2 % Crea (Ketoconazole) .... Two Times A Day 11)  Onetouch Ultra Test  Strp (Glucose Blood) .... Test Blood Sugar Once Daily 12)  Locoid Lipocream 0.1 % Crea (Hydrocortisone Butyr Lipo Base) .... One Application To Affected Area Twice Daily, Every Other Week. 13)  Pramosone E 1-2.5 % Crea (Pramoxine-Hc Emollient Base) .... Apply Twice Daily Every Other Week. 14)  Magnesium 500 Mg Tabs (Magnesium) .... Take One Tablet With A Meal Once A Day 15)  Sunscreen Spf 50 .... As Directed 16)  Lipitor 20 Mg Tabs (Atorvastatin Calcium) .... Take 3 Tablets By Mouth Once Daily 17)  Metformin Hcl 500 Mg Tabs (Metformin Hcl) .... Take 1 Tablet By Mouth Once Daily 18)  Lamotrigine 150 Mg Tabs (Lamotrigine) .... Take 1 Tablet By Mouth Once A Day 19)  Prevacid 15 Mg Cpdr (Lansoprazole) .... Take 2 Tablets By Mouth Two Times A Day 20)  Genteal 0.25-0.3 % Gel (Carboxymethylcell-Hypromellose) .... As Needed 21)  Lidocaine-Hydrocortisone Ace 3-0.5 % Crea (Lidocaine-Hydrocortisone Ace) .... Once Daily  Allergies: 1)  ! Sulfa 2)  ! Diamox Sequels (Acetazolamide) 3)  ! Pravachol 4)  ! Mobic (Meloxicam) 5)  ! Prinivil (Lisinopril) 6)  ! Penicillin V  Potassium (Penicillin V Potassium) 7)  ! Zocor (Simvastatin) 8)  ! Zetia (Ezetimibe) 9)  ! 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  Otherwise negative.    Physical Exam  General:  GEN: nad but tired appearing, at baseline, alert and oriented, pleasant HEENT: mucous membranes slighty dry, no sig change from prev.  NECK: supple, no LA CV: rrr.  no murmur PULM: ctab, no inc wob ABD: soft, +bs EXT: no pitting edema but ankles are slightly puffy R knee with normal range of motion, no deficit on ligament testing and no crepitus felt.  Not puffy/red/bruised.   Diabetes Management Exam:    Foot Exam (with socks and/or shoes not present):       Sensory-Pinprick/Light touch:          Left medial foot (L-4): normal          Left dorsal foot (L-5): normal          Left lateral foot (S-1): normal          Right medial foot (L-4): normal          Right dorsal foot (L-5): normal          Right lateral foot (S-1): normal       Sensory-Monofilament:          Left foot: normal          Right foot: normal       Inspection:          Left foot: normal          Right foot: normal       Nails:          Left foot: normal          Right foot: normal   Impression & Recommendations:  Problem # 1:  FATIGUE, CHRONIC (ICD-780.79) She has had extensive rheum work up and I don't see a reason to change meds at this point.  She is using the [place] as needed for insomnia.  I reviewed her recent labs from Dr. Nickola Major- patient brought a copy.  She had questions about GFR and we talked about this.  Her GFR is fine.  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   >25 min spent with patient, at least half of which was spent on counseling re:dx and plan.   Problem # 2:  DIABETES MELLITUS, TYPE II (ICD-250.00) See notes on labs.  I wouldn't change meds now.   Her updated medication list for this problem includes:    Diovan  80 Mg Tabs (Valsartan) .Marland Kitchen... Take 1 tablet by mouth once a day    Januvia 100 Mg Tabs (Sitagliptin phosphate) .Marland Kitchen... Take 1 tablet by mouth once a day    Metformin Hcl 500 Mg Tabs (Metformin hcl) .Marland Kitchen... Take 1 tablet by mouth once daily  Orders: TLB-A1C / Hgb A1C (Glycohemoglobin) (83036-A1C)  Problem #  3:  KNEE PAIN, RIGHT (ICD-719.46) No abnormailty seen.  She has follow up with podiatry.   Gait changes may have affected her knee.  I would follow up with podiatry and observe this for now.  She agrees.   Complete Medication List: 1)  Norvasc 10 Mg Tabs (Amlodipine besylate) .... Take 1 tablet by mouth once a day 2)  Diovan 80 Mg Tabs (Valsartan) .... Take 1 tablet by mouth once a day 3)  Januvia 100 Mg Tabs (Sitagliptin phosphate) .... Take 1 tablet by mouth once a day 4)  Ambien 10 Mg Tabs (Zolpidem tartrate) .... Take 1 tablet by mouth once a day as needed for trouble sleeping 5)  Benadryl 25 Mg Tabs (Diphenhydramine hcl) .... As needed for allergy symptoms 6)  Vitamin D 1000 Unit Tabs (Cholecalciferol) .... Once daily 7)  Cpap  .... As directed at bedtime 8)  Onetouch Ultra Control Soln (Blood glucose calibration) .... As directed. 9)  Desonate 0.05 % Gel (Desonide) .... One application to affected area twice daily 10)  Ketoconazole 2 % Crea (Ketoconazole) .... Two times a day 11)  Onetouch Ultra Test Strp (Glucose blood) .... Test blood sugar once daily 12)  Locoid Lipocream 0.1 % Crea (Hydrocortisone butyr lipo base) .... One application to affected area twice daily, every other week. 13)  Pramosone E 1-2.5 % Crea (Pramoxine-hc emollient base) .... Apply twice daily every other week. 14)  Magnesium 500 Mg Tabs (Magnesium) .... Take one tablet with a meal once a day 15)  Sunscreen Spf 50  .... As directed 16)  Lipitor 20 Mg Tabs (Atorvastatin calcium) .... Take 3 tablets by mouth once daily 17)  Metformin Hcl 500 Mg Tabs (Metformin hcl) .... Take 1 tablet by mouth once daily 18)   Lamotrigine 150 Mg Tabs (Lamotrigine) .... Take 1 tablet by mouth once a day 19)  Prevacid 15 Mg Cpdr (Lansoprazole) .... Take 2 tablets by mouth two times a day 20)  Genteal 0.25-0.3 % Gel (Carboxymethylcell-hypromellose) .... As needed 21)  Lidocaine-hydrocortisone Ace 3-0.5 % Crea (Lidocaine-hydrocortisone ace) .... Once daily  Patient Instructions: 1)  We'll contact you with your lab report. 2)  Let me know if your knee pain gets worse.  3)  Don't change your meds in the meantime. 4)  I'd like to see you back in 3months- OV.  Take care.  5)  Call with questions in the meantime.    Orders Added: 1)  Est. Patient Level IV [04540] 2)  TLB-A1C / Hgb A1C (Glycohemoglobin) [83036-A1C]    Current Allergies (reviewed today): ! SULFA ! DIAMOX SEQUELS (ACETAZOLAMIDE) ! PRAVACHOL ! MOBIC (MELOXICAM) ! PRINIVIL (LISINOPRIL) ! PENICILLIN V POTASSIUM (PENICILLIN V POTASSIUM) ! ZOCOR (SIMVASTATIN) ! ZETIA (EZETIMIBE) ! * LATEX

## 2010-09-14 NOTE — Letter (Signed)
Summary: Dermatology Patient Letter/[facility]  [facility]   Imported By: Maryln Gottron 02/21/2010 10:32:52  _____________________________________________________________________  External Attachment:    Type:   Image     Comment:   External Document

## 2010-09-14 NOTE — Miscellaneous (Signed)
  Clinical Lists Changes  Observations: Added new observation of CREATININE: 0.85 mg/dL (47/82/9562 13:08) Added new observation of TSH: 0.95 microintl units/mL (01/18/2010 14:52) Added new observation of MICROABL/CRE: 7.89 mg/mg (creat) (12/22/2009 14:52) Added new observation of MICROALB TST: 3.1 mg/dL (65/78/4696 29:52) Added new observation of CHOL/HDL: 4.05  (12/22/2009 14:52) Added new observation of LDL: 95 mg/dL (84/13/2440 10:27) Added new observation of HDL: 37 mg/dL (25/36/6440 34:74) Added new observation of TRIGLYC TOT: 107 mg/dL (25/95/6387 56:43) Added new observation of CHOLESTEROL: 150 mg/dL (32/95/1884 16:60) Added new observation of HGBA1C: 5.7 % (12/22/2009 14:52) Added new observation of HGBA1C: 6.4 % (08/31/2009 14:52) Added new observation of HIV AB: <1.00  (07/13/2009 14:52) Added new observation of HIV1RNA QA: Non-Reactive copies/mL (07/13/2009 14:52) Added new observation of HEP B E-AG: Negative  (07/13/2009 14:52) Added new observation of HEP C AB: <0.1  (07/13/2009 14:52) Added new observation of HEP B C AB: Negative  (07/13/2009 14:52) Added new observation of PNEUMOVAX: Historical  (12/16/2003 14:53) Added new observation of TD BOOSTER: Historical  (12/16/2003 14:53)      Immunization History:  Tetanus/Td Immunization History:    Tetanus/Td:  historical (12/16/2003)  Pneumovax Immunization History:    Pneumovax:  historical (12/16/2003)    Prevention & Chronic Care Immunizations   Influenza vaccine: Fluvax 3+  (05/28/2008)    Tetanus booster: 12/16/2003: Historical    Pneumococcal vaccine: Historical  (12/16/2003)  Colorectal Screening   Hemoccult: Not documented    Colonoscopy: Diverticulosis  (03/12/2008)   Colonoscopy due: 02/2018  Other Screening   Pap smear: Done  (02/08/2009)    Mammogram: ASSESSMENT: Negative - BI-RADS 1^MM DIGITAL SCREENING  (03/16/2010)   Smoking status: never  (01/31/2010)  Diabetes Mellitus   HgbA1C:  6.5  (05/15/2010)    Eye exam: normal  (09/28/2009)    Foot exam: yes  (05/15/2010)   High risk foot: Not documented   Foot care education: Not documented    Urine microalbumin/creatinine ratio: 0.5  (05/15/2010)  Lipids   Total Cholesterol: 150  (12/22/2009)   LDL: 95  (12/22/2009)   LDL Direct: 95  (12/22/2009)   HDL: 37  (12/22/2009)   Triglycerides: 107  (12/22/2009)    SGOT (AST): 17  (05/15/2010)   SGPT (ALT): 18  (05/15/2010)   Alkaline phosphatase: 113  (05/15/2010)   Total bilirubin: 0.3  (05/15/2010)  Hypertension   Last Blood Pressure: 108 / 60  (05/26/2010)   Serum creatinine: 0.85  (02/14/2010)   Serum potassium 4.4  (12/22/2009)  Self-Management Support :    Diabetes self-management support: Not documented    Hypertension self-management support: Not documented    Lipid self-management support: Not documented

## 2010-09-14 NOTE — Letter (Signed)
Summary: [facility]   Imported By: Lanelle Bal 02/07/2010 14:08:13  _____________________________________________________________________  External Attachment:    Type:   Image     Comment:   External Document

## 2010-09-14 NOTE — Letter (Signed)
Summary: [facility] IM @ [name]   Imported By: Lanelle Bal 02/28/2010 10:30:08  _____________________________________________________________________  External Attachment:    Type:   Image     Comment:   External Document

## 2010-09-14 NOTE — Letter (Signed)
Summary: [facility]   Imported By: Lanelle Bal 02/07/2010 14:06:09  _____________________________________________________________________  External Attachment:    Type:   Image     Comment:   External Document

## 2010-09-14 NOTE — Letter (Signed)
Summary: [facility]   Imported By: Lanelle Bal 02/07/2010 14:10:05  _____________________________________________________________________  External Attachment:    Type:   Image     Comment:   External Document

## 2010-09-14 NOTE — Progress Notes (Signed)
Summary: Switch from [facility]  Phone Note From Other Clinic   Caller: [facility] @ Dr Para March [phone] Call For: Dr Christella Hartigan (Doc of the Day) Summary of Call: Would like to switch from [facility] to [facility] because he is a patient of Dr Para March who used to be at [facility] but is no longer there. GI records have been scanned into EMR for review. No problems with other GI Physician, just wants to stay with  because Dr Para March is with [facility]. Initial call taken by: Leanor Kail [facility],  May 23, 2010 9:58 AM  Follow-up for Phone Call        can we have records from [facility] sent over for review. Follow-up by: Rachael Fee MD,  May 23, 2010 10:27 AM  Additional Follow-up for Phone Call Additional follow up Details #1::        They have been scanned into EMR. Chales Abrahams CMA Duncan Dull)  May 23, 2010 10:34 AM     Additional Follow-up for Phone Call Additional follow up Details #2::    ok, had colonoscoyp july 2009, diverticulosis noted, no polyps.  she needs recall colonoscoyp in July 2019.  if there are any other issues to discuss, I'm happy to see her as a ngi patient Follow-up by: Rachael Fee MD,  May 23, 2010 10:36 AM  Additional Follow-up for Phone Call Additional follow up Details #3:: Details for Additional Follow-up Action Taken: pt is having anal itching  would like to be seen. Chales Abrahams CMA Duncan Dull)  May 23, 2010 10:40 AM    Appended Document: Switch from [facility] to [facility]    Clinical Lists Changes  Observations: Added new observation of COLONNXTDUE: 02/2018 (05/23/2010 10:42)

## 2010-09-14 NOTE — Procedures (Signed)
Summary: Upper GI Endoscopy by Dr.Martin Johnson,[facility]  Upper GI Endoscopy by Dr.Martin Johnson,[facility]   Imported By: Beau Fanny 02/09/2010 16:27:32  _____________________________________________________________________  External Attachment:    Type:   Image     Comment:   External Document

## 2010-09-14 NOTE — Assessment & Plan Note (Signed)
Summary: TRANSFER FROM [facility]   Vital Signs:  Patient profile:   55 year old female Height:      65.5 inches Weight:      172 pounds BMI:     28.29 Temp:     98.1 degrees F oral Pulse rate:   88 / minute Pulse rhythm:   regular BP sitting:   102 / 66  (left arm) Cuff size:   regular  Vitals Entered By: Delilah Shan CMA Ayan [place] Dull) (February 15, 2010 2:42 PM) CC: Transfer from [place]   History of Present Illness: Chronic fatigue and fibromyalgia.  Long standing h/o pain, see scanned forms from today for timeline.  Trigger points diffusly tender to palpation, but cervical/upper back/hip points are most tender.  Applying for disability.  Pain has been disabling for patient so that she has been unable to do activities outside of the home (even with medical treatment).  Pain and fatigue may also limit activity in the home.   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  Will continue to see Dr. Nickola Major with rheum locally.  Wants to defer plaquenil therapy for now.  Has been having more jaw pain and wonders this this could be related to the sjrogen's and subsequent parotid involvement.  tender under the angle of the jaw bilaterally, but worse on the right.   DM2 and thyroid disease per Dr. Sharl Ma.    GI MD- Dr. Laural Benes with [facility].  Has lost weight and had recent EGD with benign gastric polyps.    Current Medications (verified): 1)  Norvasc 10 Mg Tabs (Amlodipine Besylate) .... Take 1 Tablet By Mouth Once A Day 2)  Diovan 80 Mg Tabs (Valsartan) .... Take 1 Tablet By Mouth Once A Day 3)  Prevacid 30 Mg Cpdr (Lansoprazole) .... Take 1 Tablet By Mouth Twice  A Day 4)  Januvia 100 Mg Tabs (Sitagliptin Phosphate) .... Take 1 Tablet By Mouth Once A Day 5)  Ambien 10 Mg Tabs (Zolpidem Tartrate) .... Take 1 Tablet By Mouth Once A Day As Needed For Trouble Sleeping 6)  Benadryl 25 Mg Tabs (Diphenhydramine Hcl) .... As  Needed For Allergy Symptoms 7)  Vicodin Es 7.5-750 Mg Tabs (Hydrocodone-Acetaminophen) .... As Needed 8)  Murine Tears For Dry Eyes 5-6 Mg/ml Soln (Polyvinyl Alcohol-Povidone) .... As Needed 9)  Voltaren 1 % Gel (Diclofenac Sodium) .... As Needed 10)  Vitamin D 1000 Unit  Tabs (Cholecalciferol) .... Once Daily 11)  Cpap .... As Directed At Bedtime 12)  Onetouch Ultra Control  Soln (Blood Glucose Calibration) .... As Directed. 13)  Desonate 0.05 % Gel (Desonide) .... One Application To Affected Area Twice Daily 14)  Ketoconazole 2 % Crea (Ketoconazole) .... Two Times A Day 15)  Onetouch Ultra Test  Strp (Glucose Blood) .... Test Blood Sugar Once Daily 16)  Triamcinolone Acetonide 0.1 % Crea (Triamcinolone Acetonide) .... One Application Sparingly To Affected Area Twice Daily 17)  Hydrocortisone Acetate 25 Mg Supp (Hydrocortisone Acetate) .... One Suppository At Night 18)  Locoid Lipocream 0.1 % Crea (Hydrocortisone Butyr Lipo Base) .... One Application To Affected Area Twice Daily, Every Other Week. 19)  Pramosone E 1-2.5 % Crea (Pramoxine-Hc Emollient Base) .... Apply Twice Daily Every Other Week. 20)  Magnesium 500 Mg Tabs (Magnesium) .... Take One Tablet With A Meal Once A Day 21)  Sunscreen Spf 50 .... As Directed 22)  Lipitor 20 Mg Tabs (Atorvastatin Calcium) .Marland KitchenMarland KitchenMarland Kitchen  Take 3 Tablets By Mouth Once Daily 23)  Metformin Hcl 500 Mg Tabs (Metformin Hcl) .... Take 1 Tablet By Mouth Two Times A Day 24)  Lamotrigine 150 Mg Tabs (Lamotrigine) .... Take 1 Tablet By Mouth Once A Day  Allergies: 1)  ! Sulfa 2)  ! Diamox Sequels (Acetazolamide) 3)  ! Pravachol (Pravastatin Sodium) 4)  ! Mobic (Meloxicam) 5)  ! Prinivil (Lisinopril) 6)  ! Penicillin V Potassium (Penicillin V Potassium) 7)  ! Zocor (Simvastatin) 8)  ! Zetia (Ezetimibe) 9)  ! * Latex  Review of Systems       + diffuse muscle aches, malaise, fatigue, insomnia that is some better with ambien, occ sweats, headaches, dry  mouth  Physical Exam  General:  GEN: nad but uncomfortable, alert and oriented HEENT: mucous membranes slightly dry; R angle of jaw is tender to palpation  NECK: posterior triggerpoints are tender to palpation  CV: rrr.  no murmur PULM: ctab, no inc wob ABD: soft, +bs EXT: no pitting edema but ankles are slightly puffy SKIN: hyperpigmented lesion noted on L thigh Mouth:  good dentition.     Impression & Recommendations:  Problem # 1:  FIBROMYALGIA (ICD-729.1) Continue symptomatic tx.  See scanned forms FA:OZHY of onset and disability papers.   Her updated medication list for this problem includes:    Vicodin Es 7.5-750 Mg Tabs (Hydrocodone-acetaminophen) .Marland Kitchen... removed from med list.  See following update.   Problem # 2:  UNSPECIFIED DIFFUSE CONNECTIVE TISSUE DISEASE- DR. HAWKES (ICD-710.9) Refer to ENT for input on Sjrogen's disease and jaw pain.  No acute pathology that would warrant other intervention today.  Continue with rheum with Dr. Lendon Colonel.   Orders: ENT Referral (ENT)  Problem # 3:  GERD-EGD WITH NEGATIVE PATH 01/2010 (ICD-530.81) No change in total daily dose.  Her updated medication list for this problem includes:    Prevacid 30 Mg Cpdr (Lansoprazole) ..... See note on following clinical update QM:VHQI change.   Complete Medication List: 1)  Norvasc 10 Mg Tabs (Amlodipine besylate) .... Take 1 tablet by mouth once a day 2)  Diovan 80 Mg Tabs (Valsartan) .... Take 1 tablet by mouth once a day 3)  Prevacid 30 Mg Cpdr (Lansoprazole) .... Take 1 tablet by mouth twice  a day 4)  Januvia 100 Mg Tabs (Sitagliptin phosphate) .... Take 1 tablet by mouth once a day 5)  Ambien 10 Mg Tabs (Zolpidem tartrate) .... Take 1 tablet by mouth once a day as needed for trouble sleeping 6)  Benadryl 25 Mg Tabs (Diphenhydramine hcl) .... As needed for allergy symptoms 7)  Vicodin Es 7.5-750 Mg Tabs (Hydrocodone-acetaminophen) .... As needed 8)  Murine Tears For Dry Eyes 5-6 Mg/ml Soln  (Polyvinyl alcohol-povidone) .... As needed 9)  Voltaren 1 % Gel (Diclofenac sodium) .... As needed 10)  Vitamin D 1000 Unit Tabs (Cholecalciferol) .... Once daily 11)  Cpap  .... As directed at bedtime 12)  Onetouch Ultra Control Soln (Blood glucose calibration) .... As directed. 13)  Desonate 0.05 % Gel (Desonide) .... One application to affected area twice daily 14)  Ketoconazole 2 % Crea (Ketoconazole) .... Two times a day 15)  Onetouch Ultra Test Strp (Glucose blood) .... Test blood sugar once daily 16)  Triamcinolone Acetonide 0.1 % Crea (Triamcinolone acetonide) .... One application sparingly to affected area twice daily 17)  Hydrocortisone Acetate 25 Mg Supp (Hydrocortisone acetate) .... One suppository at night 18)  Locoid Lipocream 0.1 % Crea (Hydrocortisone butyr lipo base) .Marland KitchenMarland KitchenMarland Kitchen  One application to affected area twice daily, every other week. 19)  Pramosone E 1-2.5 % Crea (Pramoxine-hc emollient base) .... Apply twice daily every other week. 20)  Magnesium 500 Mg Tabs (Magnesium) .... Take one tablet with a meal once a day 21)  Sunscreen Spf 50  .... As directed 22)  Lipitor 20 Mg Tabs (Atorvastatin calcium) .... Take 3 tablets by mouth once daily 23)  Metformin Hcl 500 Mg Tabs (Metformin hcl) .... Take 1 tablet by mouth two times a day 24)  Lamotrigine 150 Mg Tabs (Lamotrigine) .... Take 1 tablet by mouth once a day  Patient Instructions: 1)  Please schedule a follow-up appointment in 3 months .  30 min appointment.  Prescriptions: AMBIEN 10 MG TABS (ZOLPIDEM TARTRATE) Take 1 tablet by mouth once a day as needed for trouble sleeping  #30 x 5   Entered and Authorized by:   Crawford Givens MD   Signed by:   Crawford Givens MD on 02/15/2010   Method used:   Telephoned to ...       [facility] [address] # [phone]* (retail)       [address] [place], [place]  [address]       Ph: [phone]       Fax: [phone]   RxID:   [id number] LIPITOR 20 MG TABS  (ATORVASTATIN CALCIUM) Take 3 tablets by mouth once daily  #90 x 12   Entered and Authorized by:   Crawford Givens MD   Signed by:   Crawford Givens MD on 02/15/2010   Method used:   Electronically to        [facility] # [phone]* (retail)       [address] [place], [place]  [address]       Ph: [phone]       Fax: [phone]   RxID:   [id number] DIOVAN 80 MG TABS (VALSARTAN) Take 1 tablet by mouth once a day  #30 x 12   Entered and Authorized by:   Crawford Givens MD   Signed by:   Crawford Givens MD on 02/15/2010   Method used:   Electronically to        [facility] # [phone]* (retail)       [address] [place], [place]  [address]       Ph: [phone]       Fax: [phone]   RxID:   [id number] NORVASC 10 MG TABS (AMLODIPINE BESYLATE) Take 1 tablet by mouth once a day  #30 x 12   Entered and Authorized by:   Crawford Givens MD   Signed by:   Crawford Givens MD on 02/15/2010   Method used:   Electronically to        [facility] # [phone]* (retail)       [address] [place], [place]  [address]       Ph: [phone]       Fax: [phone]   RxID:   [id number]   Current Allergies (reviewed today): ! SULFA ! DIAMOX SEQUELS (ACETAZOLAMIDE) ! PRAVACHOL (PRAVASTATIN SODIUM) ! MOBIC (MELOXICAM) !  PRINIVIL (LISINOPRIL) ! PENICILLIN V POTASSIUM (PENICILLIN V POTASSIUM) ! ZOCOR (SIMVASTATIN) ! ZETIA (EZETIMIBE) ! * LATEX

## 2010-09-14 NOTE — Progress Notes (Signed)
Summary: prior auth given for zolpidem  Phone Note Call from Patient   Caller: Patient Summary of Call: Pt received letter from [facility] advising her that her prior auth on zolpidem will expire next month.  Form recieved from [facility] and completed.  Renewal for prior auth given.   Approval letter placed on doctors desk for signature and scanning. Initial call taken by: Lowella Petties CMA, AAMA,  August 03, 2010 8:31 AM

## 2010-09-14 NOTE — Procedures (Signed)
Summary: Colonoscopy/[facility]  Colonoscopy/[facility]   Imported By: Lanelle Bal 02/07/2010 14:36:45  _____________________________________________________________________  External Attachment:    Type:   Image     Comment:   External Document  Appended Document: Colonoscopy/[facility]    Clinical Lists Changes  Observations: Added new observation of COLONOSCOPY: Diverticulosis (03/12/2008 13:16)       Preventive Care Screening  Colonoscopy:    Date:  03/12/2008    Results:  Diverticulosis

## 2010-09-14 NOTE — Miscellaneous (Signed)
  Clinical Lists Changes  Problems: Changed problem from LUPUS (ICD-710.0) to UNSPECIFIED DIFFUSE CONNECTIVE TISSUE DISEASE- DR. HAWKES (ICD-710.9) Observations: Added new observation of EYES COMMENT: 10/2010 (02/10/2010 12:53) Added new observation of EYE EXAM BY: Dr Eulah Pont (09/28/2009 13:02) Added new observation of DMEYEEXMRES: normal (09/28/2009 13:02) Added new observation of DIAB EYE EX: normal (09/28/2009 13:02)             Diabetes Management History:      She says that she is not exercising regularly.    Diabetes Management Exam:    Eye Exam:       Eye Exam done elsewhere          Date: 09/28/2009          Results: normal          Done by: Dr Eulah Pont

## 2010-09-14 NOTE — Letter (Signed)
Summary: [facility]   [facility]   Imported By: Maryln Gottron 02/21/2010 10:29:41  _____________________________________________________________________  External Attachment:    Type:   Image     Comment:   External Document

## 2010-09-14 NOTE — Letter (Signed)
Summary: Rosie Fate, PhD  Psychotherapy-[facility]andra Roselie Skinner, PhD   Imported By: Maryln Gottron 02/21/2010 10:37:48  _____________________________________________________________________  External Attachment:    Type:   Image     Comment:   External Document

## 2010-09-14 NOTE — Assessment & Plan Note (Signed)
Summary: congestion/mhf-- Rm 4   Vital Signs:  Patient profile:   55 year old female Height:      65.5 inches Weight:      163.50 pounds BMI:     26.89 Temp:     97.8 degrees F oral Pulse rate:   78 / minute Pulse rhythm:   regular Resp:     18 per minute BP sitting:   120 / 78  (right arm) Cuff size:   regular  Vitals Entered By: Mervin Kung CMA Duncan Dull) (May 29, 2010 9:47 AM) CC: Rm 4  Pt states she has had head congestion, hoarseness, fatigue, intermittent fevers and no appetite x 9 days. OTC cold med has not helped. Is Patient Diabetic? Yes Pain Assessment Patient in pain? no      Comments Pt has completed Triamcinolone cream. Pt agrees all other med doses and directions are correct. Nicki Guadalajara Fergerson CMA [place] Dull)  May 29, 2010 9:54 AM    Primary Care Provider:  Crawford Givens, MD  CC:  Rm 4  Pt states she has had head congestion, hoarseness, fatigue, and intermittent fevers and no appetite x 9 days. OTC cold med has not helped.Marland Kitchen  History of Present Illness: Lori Zamora is a 55 year old female with multiple medical problems who presents today with complaint of sore throat which started about 9 days ago and then ultimately resolved after 2 days. This was followed by sinus congestion and copious green nasal discharge.  She also notes malaise and intermittent low grade fevers (99-100 per patient)  Has not beening eating well due to poor appetite.  Mild nausea without vomitting.  Notes that she has lost 4 pounds during this acute illness.  She does report that she has been drinking a lot of fluids and is keeping these down.  Has tried OTC cold medicine and tylenol without help.   Allergies: 1)  ! Sulfa 2)  ! Diamox Sequels (Acetazolamide) 3)  ! Pravachol (Pravastatin Sodium) 4)  ! Mobic (Meloxicam) 5)  ! Prinivil (Lisinopril) 6)  ! Penicillin V Potassium (Penicillin V Potassium) 7)  ! Zocor (Simvastatin) 8)  ! Zetia (Ezetimibe) 9)  ! 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 No apparent alopecia or balding. Mild maxillary and frontal sinus tenderness to palpation Eyes:  PERRLA, sclera are clear Ears:  External ear exam shows no significant lesions or deformities.  Otoscopic examination reveals clear canals, tympanic membranes are intact bilaterally without bulging, retraction, inflammation or discharge. Hearing is grossly normal bilaterally. Mouth:  Oral mucosa and oropharynx without lesions or exudates.   Neck:  No deformities, masses, or tenderness noted. Lungs:  Normal respiratory effort, chest expands symmetrically. Lungs are clear to auscultation, no crackles or wheezes. Heart:  Normal rate and regular  rhythm. S1 and S2 normal without gallop, murmur, click, rub or other extra sounds. Extremities:  No clubbing, cyanosis, edema, or deformity noted  Cervical Nodes:  No lymphadenopathy noted Psych:  Oriented X3, memory intact for recent and remote, and normally  interactive.     Impression & Recommendations:  Problem # 1:  SINUSITIS, ACUTE (ICD-461.9) Assessment New Pt has multiple drug allergies including penicillin and sulfa.  Will plan to treat with Levaquin.   Patient was instructed to call Dr. Para March if symptoms worsen or do not improve as noted in pt. sign out instructions.  She verbalizes understanding. Her updated medication list for this problem includes:    Levaquin 750 Mg Tabs (Levofloxacin) ..... One tablet by mouth once daily for 5 days  Complete Medication List: 1)  Norvasc 10 Mg Tabs (Amlodipine besylate) .... Take 1 tablet by mouth once a day 2)  Diovan 80 Mg Tabs (Valsartan) .... Take 1 tablet by mouth once a day 3)  Januvia 100 Mg Tabs (Sitagliptin phosphate) .... Take 1 tablet by mouth once a day 4)  Ambien 10 Mg Tabs (Zolpidem tartrate) .... Take 1 tablet by mouth once a day as needed for trouble sleeping 5)  Benadryl 25 Mg Tabs (Diphenhydramine hcl) .... As needed for allergy symptoms 6)  Voltaren 1 % Gel (Diclofenac sodium) .... As needed 7)  Vitamin D 1000 Unit Tabs (Cholecalciferol) .... Once daily 8)  Cpap  .... As directed at bedtime 9)  Onetouch Ultra Control Soln (Blood glucose calibration) .... As directed. 10)  Desonate 0.05 % Gel (Desonide) .... One application to affected area twice daily 11)  Ketoconazole 2 % Crea (Ketoconazole) .... Two times a day 12)  Onetouch Ultra Test Strp (Glucose blood) .... Test blood sugar once daily 13)  Triamcinolone Acetonide 0.1 % Crea (Triamcinolone acetonide) .... One once daily 14)  Locoid Lipocream 0.1 % Crea (Hydrocortisone butyr lipo base) .... One application to affected area twice daily, every other week. 15)  Pramosone E 1-2.5 % Crea (Pramoxine-hc emollient base) .... Apply twice daily every other week. 16)  Magnesium 500 Mg Tabs (Magnesium) .... Take one tablet with a meal once a day 17)  Sunscreen Spf 50  .... As directed 18)  Lipitor 20 Mg Tabs (Atorvastatin calcium)  .... Take 3 tablets by mouth once daily 19)  Metformin Hcl 500 Mg Tabs (Metformin hcl) .... Take 1 tablet by mouth two times a day 20)  Lamotrigine 150 Mg Tabs (Lamotrigine) .... Take 1 tablet by mouth once a day 21)  Prevacid 15 Mg Cpdr (Lansoprazole) .... Take 2 tablets by mouth two times a day 22)  Genteal 0.25-0.3 % Gel (Carboxymethylcell-hypromellose) .... As needed 23)  Analpram-hc Singles 1-1 % Crea (Hydrocortisone ace-pramoxine) .... Insert into anus once a day 24)  Levaquin 750 Mg Tabs (Levofloxacin) .... One tablet by mouth once daily for 5 days  Patient Instructions: 1)  Call if you develop fever over 101, increasing sinus pressure, pain with eye movement, increased facial tenderness of swelling, or if you develop visual changes. 2)  Follow up with Dr. Para March if symptoms worsen, or if they are not completely resolved in 1 week. Prescriptions: LEVAQUIN 750 MG TABS (LEVOFLOXACIN) one tablet by mouth once daily for 5 days  #5 x 0   Entered and Authorized by:   Lemont Fillers FNP   Signed by:   Lemont Fillers FNP on 05/29/2010   Method used:   Electronically to  [facility] [address] # [phone]* (retail)       [address] [place], [place]  [address]       Ph: [phone]       Fax: [phone]   RxID:   [id number]    Orders Added: 1)  Est. Patient Level III [63875]    Current Allergies (reviewed today): ! SULFA ! DIAMOX SEQUELS (ACETAZOLAMIDE) ! PRAVACHOL (PRAVASTATIN SODIUM) ! MOBIC (MELOXICAM) ! PRINIVIL (LISINOPRIL) ! PENICILLIN V POTASSIUM (PENICILLIN V POTASSIUM) ! ZOCOR (SIMVASTATIN) ! ZETIA (EZETIMIBE) ! * LATEX

## 2010-09-14 NOTE — Assessment & Plan Note (Signed)
Summary: ROA FOR 3 MONTH FOLLOW-UP/JRR   Vital Signs:  Patient profile:   55 year old female Height:      65.5 inches Weight:      167 pounds BMI:     27.47 Temp:     98.6 degrees F oral Pulse rate:   88 / minute Pulse rhythm:   regular BP sitting:   108 / 60  (left arm) Cuff size:   regular  Vitals Entered By: Delilah Shan CMA (AAMA) (May 15, 2010 11:00 AM) CC: 3 months follow up   History of Present Illness: H/o chronic fatigue and fibromyalgia.  fatigue is as baseline and trigger points are tender now.  Continues to have dry skin and parotid findings- h/o connective tissue disease noted.  Some control over pigment changes with covering the skin during sun exposure.  Has has follow up with rheum at [facility] and locally with Dr. Nickola Major.   Continues to have anal puritis and irritation- no relief with topical tx.  Has prev seen [facility] but would like transfer to [facility].  Prev colonoscopy done.  Asking for advice.  Diabetes:  Using medications without difficulties:yes, off metformin w/o change in GI symptoms- constipation Hypoglycemic episodes:no Hyperglycemic episodes:no Feet problems:no  due for labs and flu shot.   Allergies: 1)  ! Sulfa 2)  ! Diamox Sequels (Acetazolamide) 3)  ! Pravachol (Pravastatin Sodium) 4)  ! Mobic (Meloxicam) 5)  ! Prinivil (Lisinopril) 6)  ! Penicillin V Potassium (Penicillin V Potassium) 7)  ! Zocor (Simvastatin) 8)  ! Zetia (Ezetimibe) 9)  ! 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 Marital Status: Single Children: None Occupation: Runner, broadcasting/film/video Never Smoked Alcohol use-no Drug use-no Regular exercise-no  Review of Systems       See HPI.  Also with dry eyes.  Otherwise negative.    Physical Exam  General:  GEN: nad but tired appearing, alert and oriented HEENT: mucous membranes slighty dry.  NECK: posterior triggerpoints are tender to palpation  CV: rrr.  no murmur PULM: ctab, no inc wob ABD: soft, +bs EXT: no pitting edema but ankles are slightly puffy SKIN: hyperpigmented lesion noted on L thigh  Diabetes Management Exam:    Foot Exam (with socks and/or shoes not present):       Sensory-Pinprick/Light touch:          Left medial foot (L-4): normal          Left dorsal foot (L-5): normal          Left lateral foot (S-1): normal          Right medial foot (L-4): normal          Right dorsal foot (L-5): normal          Right lateral foot (S-1): normal       Sensory-Monofilament:          Left foot: normal  Right foot: normal       Inspection:          Left foot: normal          Right foot: normal       Nails:          Left foot: normal          Right foot: normal    Impression & Recommendations:  Problem # 1:  FIBROMYALGIA (ICD-729.1) No change in meds.  This appears to be baseline for patient.   Problem # 2:  DIABETES MELLITUS, TYPE II (ICD-250.00) See notes on labs.  Her updated medication list for this problem includes:    Diovan 80 Mg Tabs (Valsartan) .Marland Kitchen... Take 1 tablet by mouth once a day    Januvia 100  Mg Tabs (Sitagliptin phosphate) .Marland Kitchen... Take 1 tablet by mouth once a day    Metformin Hcl 500 Mg Tabs (Metformin hcl) .Marland Kitchen... Take 1 tablet by mouth two times a day (held as of 10/11)  Orders: TLB-A1C / Hgb A1C (Glycohemoglobin) (83036-A1C) TLB-Microalbumin/Creat Ratio, Urine (82043-MALB) TLB-Hepatic/Liver Function Pnl (80076-HEPATIC)  Problem # 3:  PRURITUS, ANAL (ICD-698.0) I would like GI input.  No relief with topical/external tx.  I don't know if patient would need repeat sigmoid eval.  She describes "deep, internal" irritation.  Will defer to GI.  Orders: Gastroenterology Referral (GI)  Complete Medication List: 1)  Norvasc 10 Mg Tabs (Amlodipine besylate) .... Take 1 tablet by mouth once a day 2)  Diovan 80 Mg Tabs (Valsartan) .... Take 1 tablet by mouth once a day 3)  Januvia 100 Mg Tabs (Sitagliptin phosphate) .... Take 1 tablet by mouth once a day 4)  Ambien 10 Mg Tabs (Zolpidem tartrate) .... Take 1 tablet by mouth once a day as needed for trouble sleeping 5)  Benadryl 25 Mg Tabs (Diphenhydramine hcl) .... As needed for allergy symptoms 6)  Voltaren 1 % Gel (Diclofenac sodium) .... As needed 7)  Vitamin D 1000 Unit Tabs (Cholecalciferol) .... Once daily 8)  Cpap  .... As directed at bedtime 9)  Onetouch Ultra Control Soln (Blood glucose calibration) .... As directed. 10)  Desonate 0.05 % Gel (Desonide) .... One application to affected area twice daily 11)  Ketoconazole 2 % Crea (Ketoconazole) .... Two times a day 12)  Onetouch Ultra Test Strp (Glucose blood) .... Test blood sugar once daily 13)  Triamcinolone Acetonide 0.1 % Crea (Triamcinolone acetonide) .... One application sparingly to affected area twice daily 14)  Hydrocortisone Acetate 25 Mg Supp (Hydrocortisone acetate) .... One suppository at night 15)  Locoid Lipocream 0.1 % Crea (Hydrocortisone butyr lipo base) .... One application to affected area twice daily, every other week. 16)  Pramosone E 1-2.5 % Crea  (Pramoxine-hc emollient base) .... Apply twice daily every other week. 17)  Magnesium 500 Mg Tabs (Magnesium) .... Take one tablet with a meal once a day 18)  Sunscreen Spf 50  .... As directed 19)  Lipitor 20 Mg Tabs (Atorvastatin calcium) .... Take 3 tablets by mouth once daily 20)  Metformin Hcl 500 Mg Tabs (Metformin hcl) .... Take 1 tablet by mouth two times a day (held as of 10/11) 21)  Lamotrigine 150 Mg Tabs (Lamotrigine) .... Take 1 tablet by mouth once a day 22)  Prevacid 15 Mg Cpdr (Lansoprazole) .... Take 2 tablets by mouth two times a day 23)  Genteal 0.25-0.3 % Gel (Carboxymethylcell-hypromellose) .... As needed  Patient Instructions: 1)  You can get your results  through our phone system.  Follow the instructions on the blue card.  2)  See Shirlee Limerick about your referral before your leave today.   3)  Plan on coming back in 3 months for a appointment.   Current Allergies (reviewed today): ! SULFA ! DIAMOX SEQUELS (ACETAZOLAMIDE) ! PRAVACHOL (PRAVASTATIN SODIUM) ! MOBIC (MELOXICAM) ! PRINIVIL (LISINOPRIL) ! PENICILLIN V POTASSIUM (PENICILLIN V POTASSIUM) ! ZOCOR (SIMVASTATIN) ! ZETIA (EZETIMIBE) ! * LATEX

## 2010-09-14 NOTE — Letter (Signed)
Summary: Andee Poles MD PA  Andee Poles MD PA   Imported By: Lanelle Bal 02/07/2010 14:07:09  _____________________________________________________________________  External Attachment:    Type:   Image     Comment:   External Document

## 2010-09-14 NOTE — Miscellaneous (Signed)
  Clinical Lists Changes  Medications: Added new medication of LAMOTRIGINE 150 MG TABS (LAMOTRIGINE) Take 1 tablet by mouth once a day Removed medication of LAMICTAL 100 MG TABS (LAMOTRIGINE) Take 1 tablet by mouth once a day

## 2010-09-14 NOTE — Letter (Signed)
Summary: Records from [facility] 2010 - 2011  Records from [facility] 2010 - 2011   Imported By: Maryln Gottron 06/02/2010 11:30:24  _____________________________________________________________________  External Attachment:    Type:   Image     Comment:   External Document

## 2010-09-14 NOTE — Op Note (Signed)
Summary: US Guided Biopsy Thyroid/[facility]  US Guided Biopsy Thyroid/[facility]   Imported By: Lanelle Bal 02/07/2010 14:29:33  _____________________________________________________________________  External Attachment:    Type:   Image     Comment:   External Document

## 2010-09-14 NOTE — Consult Note (Signed)
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) VITAMIN D DEFICIENCY (ICD-268.9) BIPOLAR DISORDER UNSPECIFIED (ICD-296.80) DEGENERATIVE DISC DISEASE (ICD-722.6) OTHER THALASSEMIA (ICD-282.49) ANEMIA, IRON DEFICIENCY, MICROCYTIC (ICD-280.8) GERD (ICD-530.81) LUPUS (ICD-710.0) CONSTIPATION (ICD-564.00) MIGRAINE W/AURA W/O INTRACT W/O STATUS MIGRNOSUS (ICD-346.00) UNSPECIFIED ANEMIA (ICD-285.9) 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) FIBROMYALGIA (ICD-729.1) FATIGUE, CHRONIC (ICD-780.79) HYPERTENSION (ICD-401.9) HYPERLIPIDEMIA (ICD-272.4) DIABETES MELLITUS, TYPE II (ICD-250.00) DEPRESSION (ICD-311) ENT eval for parotid enlargement 2011- related to connective tissue syndrome Diffuse connective tissue disease/Sjogren's syndrome (03/06/2010 13:12)       Past History:  Past Medical History: Current Problems:  SLEEP APNEA (ICD-780.57) 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) VITAMIN D DEFICIENCY (ICD-268.9) BIPOLAR DISORDER UNSPECIFIED (ICD-296.80) DEGENERATIVE DISC DISEASE (ICD-722.6) OTHER THALASSEMIA (ICD-282.49) ANEMIA, IRON DEFICIENCY, MICROCYTIC (ICD-280.8) GERD (ICD-530.81) LUPUS (ICD-710.0) CONSTIPATION (ICD-564.00) MIGRAINE W/AURA W/O INTRACT W/O STATUS MIGRNOSUS (ICD-346.00) UNSPECIFIED ANEMIA (ICD-285.9) 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

## 2010-09-14 NOTE — Miscellaneous (Signed)
  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 KERR) (ICD-241.1)

## 2010-09-14 NOTE — Progress Notes (Signed)
Summary: pt being seen in [place]  Phone Note Call from Patient   Caller: Patient Call For: Crawford Givens MD Summary of Call: Pt states she feels very bad today with congestion.  She lives in [place] and doesnt think she can drive this far to be seen.  She is asking if she can go to the [place] office instead.  I checked with Jamesetta So who told me to call that office and see what they say and also let you know.  That office did have an appt open and offered to see the pt this morning.  Pt agreed and appt was made. Initial call taken by: Lowella Petties CMA,  May 29, 2010 8:33 AM  Follow-up for Phone Call        noted.  Follow-up by: Crawford Givens MD,  May 29, 2010 8:38 AM

## 2010-09-14 NOTE — Medication Information (Signed)
Summary: Prior Authorization & Approval for Additional Quantity Zolpidem/  Prior Authorization & Approval for Additional Quantity Zolpidem/[facility]   Imported By: Lanelle Bal 08/15/2010 11:01:04  _____________________________________________________________________  External Attachment:    Type:   Image     Comment:   External Document

## 2010-09-14 NOTE — Miscellaneous (Signed)
Summary: new script  Clinical Lists Changes  Medications: Removed medication of ANALPRAM-HC SINGLES 1-1 %  CREA (HYDROCORTISONE ACE-PRAMOXINE) Insert into anus once a day Added new medication of ANAMANTLE HC 3-0.5 %  CREA (LIDOCAINE-HYDROCORTISONE ACE) apply once daily - Signed Rx of ANAMANTLE HC 3-0.5 %  CREA (LIDOCAINE-HYDROCORTISONE ACE) apply once daily;  #1 month x 3;  Signed;  Entered by: Rachael Fee MD;  Authorized by: Rachael Fee MD;  Method used: Electronically to [facility] # [phone]*, [address] [place], [place], [place]  [address], Ph: [phone], Fax: [phone]    Prescriptions: ANAMANTLE HC 3-0.5 %  CREA (LIDOCAINE-HYDROCORTISONE ACE) apply once daily  #1 month x 3   Entered and Authorized by:   Rachael Fee MD   Signed by:   Rachael Fee MD on 07/03/2010   Method used:   Electronically to        [facility] # [phone]* (retail)       [address] [place], [place]  [address]       Ph: [phone]       Fax: [phone]   RxID:   [id number]

## 2010-09-14 NOTE — Letter (Signed)
Summary: [facility]   Imported By: Lanelle Bal 02/07/2010 14:19:07  _____________________________________________________________________  External Attachment:    Type:   Image     Comment:   External Document

## 2010-09-14 NOTE — Progress Notes (Signed)
Summary: Zolpidem  Phone Note Call from Patient Call back at Home Phone [phone]   Caller: Patient Call For: Crawford Givens MD Summary of Call: Ms. Hannan asked me about some refills when she was here this a.m.  According to our records, she had refills remaining on all of them but she says the pharmacy does not have any refills remaining for her Zolpidem.   Initial call taken by: Delilah Shan CMA (AAMA),  May 15, 2010 12:51 PM  Follow-up for Phone Call        please send in [place] 10mg , 1 by mouth at bedtime as needed insomnia, #90, 1rf.  thanks.  Follow-up by: Crawford Givens MD,  May 15, 2010 8:18 PM  Additional Follow-up for Phone Call Additional follow up Details #1::        Medication phoned to pharmacy.  Additional Follow-up by: Delilah Shan CMA (AAMA),  May 16, 2010 8:59 AM    Prescriptions: AMBIEN 10 MG TABS (ZOLPIDEM TARTRATE) Take 1 tablet by mouth once a day as needed for trouble sleeping  #90 x 1   Entered by:   Delilah Shan CMA (AAMA)   Authorized by:   Crawford Givens MD   Signed by:   Delilah Shan CMA (AAMA) on 05/16/2010   Method used:   Handwritten   RxID:   [id number]

## 2010-09-14 NOTE — Assessment & Plan Note (Signed)
Review of gastrointestinal problems: 1. Routine risk for colon cancer: colonoscopy 2009 ([facility]), no polpys, +tics, recommended recall in 10 year interval 2. Gastric polyp, 2010 EGD ([facility]) 3. pruritis ani: winter 2011 unclear etiology     History of Present Illness Visit Type: Follow-up Visit Primary GI MD: Rob Bunting MD Primary Provider: Crawford Givens, MD Requesting Provider: Crawford Givens, MD Chief Complaint: rectal itching History of Present Illness:     pleasant 55 year old woman whom I last saw 3 months ago.  she was unable to get the analpram ointment until about 3 weeks ago, used it for 2 weeks then ran out of it.  Her pharmacy couldn't renew it (couldn't find it).  It helped some while she was taking it.             Current Medications (verified): 1)  Norvasc 10 Mg Tabs (Amlodipine Besylate) .... Take 1 Tablet By Mouth Once A Day 2)  Diovan 80 Mg Tabs (Valsartan) .... Take 1 Tablet By Mouth Once A Day 3)  Januvia 100 Mg Tabs (Sitagliptin Phosphate) .... Take 1 Tablet By Mouth Once A Day 4)  Ambien 10 Mg Tabs (Zolpidem Tartrate) .... Take 1 Tablet By Mouth Once A Day As Needed For Trouble Sleeping 5)  Benadryl 25 Mg Tabs (Diphenhydramine Hcl) .... As Needed For Allergy Symptoms 6)  Vitamin D 1000 Unit  Tabs (Cholecalciferol) .... Once Daily 7)  Cpap .... As Directed At Bedtime 8)  Onetouch Ultra Control  Soln (Blood Glucose Calibration) .... As Directed. 9)  Desonate 0.05 % Gel (Desonide) .... One Application To Affected Area Twice Daily 10)  Ketoconazole 2 % Crea (Ketoconazole) .... Two Times A Day 11)  Onetouch Ultra Test  Strp (Glucose Blood) .... Test Blood Sugar Once Daily 12)  Triamcinolone Acetonide 0.1 % Crea (Triamcinolone Acetonide) .... One Once Daily 13)  Locoid Lipocream 0.1 % Crea (Hydrocortisone Butyr Lipo Base) .... One Application To Affected Area Twice Daily, Every Other Week. 14)  Pramosone E 1-2.5 % Crea (Pramoxine-Hc Emollient Base) ....  Apply Twice Daily Every Other Week. 15)  Magnesium 500 Mg Tabs (Magnesium) .... Take One Tablet With A Meal Once A Day 16)  Sunscreen Spf 50 .... As Directed 17)  Lipitor 20 Mg Tabs (Atorvastatin Calcium) .... Take 3 Tablets By Mouth Once Daily 18)  Metformin Hcl 500 Mg Tabs (Metformin Hcl) .... Take 1 Tablet By Mouth Once Daily 19)  Lamotrigine 150 Mg Tabs (Lamotrigine) .... Take 1 Tablet By Mouth Once A Day 20)  Prevacid 15 Mg Cpdr (Lansoprazole) .... Take 2 Tablets By Mouth Two Times A Day 21)  Genteal 0.25-0.3 % Gel (Carboxymethylcell-Hypromellose) .... As Needed 22)  Levaquin 750 Mg Tabs (Levofloxacin) .... One Tablet By Mouth Once Daily For 5 Days  Allergies (verified): 1)  ! Sulfa 2)  ! Diamox Sequels (Acetazolamide) 3)  ! Pravachol 4)  ! Mobic (Meloxicam) 5)  ! Prinivil (Lisinopril) 6)  ! Penicillin V Potassium (Penicillin V Potassium) 7)  ! Zocor (Simvastatin) 8)  ! Zetia (Ezetimibe) 9)  ! * Latex  Vital Signs:  Patient profile:   55 year old female Height:      65.5 inches Weight:      164.38 pounds BMI:     27.04 Pulse rate:   72 / minute Pulse rhythm:   regular BP sitting:   90 / 60  (left arm) Cuff size:   regular  Vitals Entered By: June McMurray CMA Duncan Dull) (August 16, 2010 1:38  PM)  Physical Exam  Additional Exam:  Constitutional: generally well appearing Psychiatric: alert and oriented times 3 Abdomen: soft, non-tender, non-distended, normal bowel sounds rectal examination with female assistant in room: completely normal external anal exam, normal internal anal exam   Impression & Recommendations:  Problem # 1:  pruritis ani ( idiopathic) she will try to avoid certain foods that are known to contribute to pruritus and I, see those listed below. A trial of steroid cream did not help and so I will have her begin desitin twice daily. She will call if things worsen or do not improve after about 2 more months.  Patient Instructions: 1)  Start using desitin,  twice daily. 2)  Avoid sodas, tea, coffee, beer, chocolate, citrus fruits.  These can cause anal itching for some people. 3)  Call if things worsen or not better in 2 months. 4)  The medication list was reviewed and reconciled.  All changed / newly prescribed medications were explained.  A complete medication list was provided to the patient / caregiver.

## 2010-09-14 NOTE — Miscellaneous (Signed)
  Clinical Lists Changes  Problems: Changed problem from GERD (ICD-530.81) to GERD-EGD WITH NEGATIVE PATH 01/2010 (ICD-530.81)

## 2010-09-14 NOTE — Letter (Signed)
Summary: [facility]  [facility]   Imported By: Lanelle Bal 02/07/2010 14:09:05  _____________________________________________________________________  External Attachment:    Type:   Image     Comment:   External Document

## 2010-09-14 NOTE — Assessment & Plan Note (Signed)
History of Present Illness Visit Type: Initial Consult Primary GI MD: Rob Bunting MD Primary Shakesha Soltau: Crawford Givens, MD Requesting Theodus Ran: Crawford Givens, MD Chief Complaint: Anal Itching History of Present Illness:     very pleasant 55 year old woman who has had anal itching for 7 months.  Evaluated at [facility] and no hemorrhoids noted in February.  Has used hydrocoritone suppository (Dr Laural Benes a few months ago), without help.  Triamcinilone cream (Dr. Para March) without help.  Has also tried cortisone OTC creams without help.   A dermatologist recently gave her a cream (she is not sure what she was given).    She is applying something to anus every day, usually just once a day.    itching was going on for 2 months before she started applyuing ointments.  Had colonoscopy, EGD in recent past ([facility]).  the colonoscopy, July 2009 found diverticulosis but no polyps. She was recommended to have a repeat colonoscopy in 10 year interval.  The EGD in 2010 found fundic gland polyps only.           Current Medications (verified): 1)  Norvasc 10 Mg Tabs (Amlodipine Besylate) .... Take 1 Tablet By Mouth Once A Day 2)  Diovan 80 Mg Tabs (Valsartan) .... Take 1 Tablet By Mouth Once A Day 3)  Januvia 100 Mg Tabs (Sitagliptin Phosphate) .... Take 1 Tablet By Mouth Once A Day 4)  Ambien 10 Mg Tabs (Zolpidem Tartrate) .... Take 1 Tablet By Mouth Once A Day As Needed For Trouble Sleeping 5)  Benadryl 25 Mg Tabs (Diphenhydramine Hcl) .... As Needed For Allergy Symptoms 6)  Voltaren 1 % Gel (Diclofenac Sodium) .... As Needed 7)  Vitamin D 1000 Unit  Tabs (Cholecalciferol) .... Once Daily 8)  Cpap .... As Directed At Bedtime 9)  Onetouch Ultra Control  Soln (Blood Glucose Calibration) .... As Directed. 10)  Desonate 0.05 % Gel (Desonide) .... One Application To Affected Area Twice Daily 11)  Ketoconazole 2 % Crea (Ketoconazole) .... Two Times A Day 12)  Onetouch Ultra Test  Strp (Glucose Blood)  .... Test Blood Sugar Once Daily 13)  Triamcinolone Acetonide 0.1 % Crea (Triamcinolone Acetonide) .... One Once Daily 14)  Locoid Lipocream 0.1 % Crea (Hydrocortisone Butyr Lipo Base) .... One Application To Affected Area Twice Daily, Every Other Week. 15)  Pramosone E 1-2.5 % Crea (Pramoxine-Hc Emollient Base) .... Apply Twice Daily Every Other Week. 16)  Magnesium 500 Mg Tabs (Magnesium) .... Take One Tablet With A Meal Once A Day 17)  Sunscreen Spf 50 .... As Directed 18)  Lipitor 20 Mg Tabs (Atorvastatin Calcium) .... Take 3 Tablets By Mouth Once Daily 19)  Metformin Hcl 500 Mg Tabs (Metformin Hcl) .... Take 1 Tablet By Mouth Two Times A Day 20)  Lamotrigine 150 Mg Tabs (Lamotrigine) .... Take 1 Tablet By Mouth Once A Day 21)  Prevacid 15 Mg Cpdr (Lansoprazole) .... Take 2 Tablets By Mouth Two Times A Day 22)  Genteal 0.25-0.3 % Gel (Carboxymethylcell-Hypromellose) .... As Needed  Allergies (verified): 1)  ! Sulfa 2)  ! Diamox Sequels (Acetazolamide) 3)  ! Pravachol (Pravastatin Sodium) 4)  ! Mobic (Meloxicam) 5)  ! Prinivil (Lisinopril) 6)  ! Penicillin V Potassium (Penicillin V Potassium) 7)  ! Zocor (Simvastatin) 8)  ! Zetia (Ezetimibe) 9)  ! 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 Family History of Alcoholism/Addiction Family History of Arthritis Family History Diabetes 1st degree relative Family History Hypertension Family History Kidney disease Family History Psychiatric care Family History of Stroke F 1st degree relative <60 Family History of Heart Disease   no colon cancer  Social History: Marital Status: Single Children: None Occupation: Runner, broadcasting/film/video Never Smoked Alcohol use-no Drug use-no Regular exercise-no    Review of Systems       Pertinent positive and negative review of systems were noted in the above HPI and GI specific review of systems.  All other review of systems was otherwise negative.   Vital Signs:  Patient profile:   55 year old female Height:      65.5 inches Weight:      166.0 pounds BMI:     27.30 Pulse rate:   86 / minute Pulse rhythm:   regular BP sitting:   108 / 60  (left arm) Cuff size:   regular  Vitals Entered By: Harlow Mares CMA Duncan Dull) (May 26, 2010 1:18 PM)  Physical Exam  Additional Exam:  Constitutional: generally well appearing Psychiatric: alert and oriented times 3 Eyes: extraocular movements intact Mouth: oropharynx moist, no lesions Neck: supple, no lymphadenopathy Cardiovascular: heart regular rate and rythm Lungs: CTA bilaterally Abdomen: soft, non-tender, non-distended, no obvious ascites, no peritoneal signs, normal bowel sounds Extremities: no lower extremity edema bilaterally Skin: no lesions on visible extremities Rectal exam with female assistant in room: completely normal external anus and internal examination was normal, no palpated masses or lesions, no obvious hemorrhoids or  fissures.   Impression & Recommendations:  Problem # 1:  pruritus ani no clear cause. Have given her a prescription for Analpram ointment to be applied internally once daily. I also recommended she try to over cleaner anus. She will sees baby wipes after every BM. She will return to see me in 6-7 weeks and sooner if needed.  Problem # 2:  routine risk for colon cancer colonoscopy for colon cancer screening, July 2019.  Patient Instructions: 1)  Insert analpram cream into anus once daily. 2)  Use baby wipes after BM. 3)  Try not to overclean anus. 4)  Return to see Dr. Christella Hartigan in 6-7 weeks. 5)  Recall colonoscopy July 2019 for colon cancer screening. 6)  The medication list was reviewed and reconciled.  All changed / newly prescribed medications were explained.  A complete medication list was provided to the patient / caregiver. Prescriptions: ANALPRAM-HC SINGLES 1-1 %  CREA (HYDROCORTISONE ACE-PRAMOXINE) Insert into anus once a day  #30 x 3   Entered and Authorized by:   Rachael Fee MD   Signed by:   Rachael Fee MD on 05/26/2010   Method used:   Electronically to        [facility] # [phone]* (retail)       [address] [place], [place]  [address]       Ph: [phone]       Fax: [phone]   RxID:   [id number]

## 2010-09-14 NOTE — Progress Notes (Signed)
Summary: Refill request   Phone Note Refill Request   Refills Requested: Medication #1:  PREVACID 30 MG CPDR Take 1 tablet by mouth twice  a day  Medication #2:  VICODIN ES 7.5-750 MG TABS as needed  Follow-up for Phone Call        please make sure vicodin is removed from med list (she is off this) and change prevacid to 15mg  OTC- 2 tabs by mouth two times a day, #120, 12rf.  This will help her with cost.  thanks.  Follow-up by: Crawford Givens MD,  February 15, 2010 9:13 PM  Additional Follow-up for Phone Call Additional follow up Details #1::        Vicodin removed from meds list. Prevacid changed as noted above.   Med sent to pharmacy.  Pharmacy advised of Vicodin denial.  Delilah Shan CMA (AAMA)  February 16, 2010 10:23 AM     New/Updated Medications: PREVACID 15 MG CPDR (LANSOPRAZOLE) Take 2 tablets by mouth two times a day Prescriptions: PREVACID 15 MG CPDR (LANSOPRAZOLE) Take 2 tablets by mouth two times a day  #120 x 12   Entered by:   Delilah Shan CMA (AAMA)   Authorized by:   Crawford Givens MD   Signed by:   Delilah Shan CMA (AAMA) on 02/16/2010   Method used:   Electronically to        [facility] # [phone]* (retail)       [address] [place], [place]  [address]       Ph: [phone]       Fax: [phone]   RxID:   [id number] PREVACID 15 MG CPDR (LANSOPRAZOLE) Take 2 tablets by mouth two times a day  #120 x 3   Entered by:   Delilah Shan CMA (AAMA)   Authorized by:   Crawford Givens MD   Signed by:   Delilah Shan CMA (AAMA) on 02/16/2010   Method used:   Electronically to        [facility] # [phone]* (retail)       [address] [place], [place]  [address]       Ph: [phone]       Fax: [phone]   RxID:   [id number]

## 2010-10-03 ENCOUNTER — Encounter: Payer: Self-pay | Admitting: Family Medicine

## 2010-10-03 DIAGNOSIS — K111 Hypertrophy of salivary gland: Secondary | ICD-10-CM | POA: Insufficient documentation

## 2010-10-03 DIAGNOSIS — M3500 Sicca syndrome, unspecified: Secondary | ICD-10-CM

## 2010-10-04 NOTE — Letter (Signed)
Summary: Dr Nickola Major note   Dr Nickola Major note   Imported By: Kassie Mends 09/25/2010 09:30:03  _____________________________________________________________________  External Attachment:    Type:   Image     Comment:   External Document

## 2010-10-04 NOTE — Letter (Signed)
Summary: Dr Nickola Major Note  Dr Nickola Major Note   Imported By: Kassie Mends 09/25/2010 09:30:49  _____________________________________________________________________  External Attachment:    Type:   Image     Comment:   External Document

## 2010-11-14 ENCOUNTER — Other Ambulatory Visit: Payer: Self-pay | Admitting: *Deleted

## 2010-11-14 MED ORDER — LANSOPRAZOLE 15 MG PO CPDR: 30.0000 mg | DELAYED_RELEASE_CAPSULE | Freq: Two times a day (BID) | ORAL | Status: DC

## 2010-11-23 ENCOUNTER — Encounter: Payer: Self-pay | Admitting: Family Medicine

## 2010-11-23 ENCOUNTER — Ambulatory Visit (INDEPENDENT_AMBULATORY_CARE_PROVIDER_SITE_OTHER): Payer: BC Managed Care – PPO | Admitting: Family Medicine

## 2010-11-23 DIAGNOSIS — D568 Other thalassemias: Secondary | ICD-10-CM

## 2010-11-23 DIAGNOSIS — K111 Hypertrophy of salivary gland: Secondary | ICD-10-CM

## 2010-11-23 DIAGNOSIS — L290 Pruritus ani: Secondary | ICD-10-CM

## 2010-11-23 DIAGNOSIS — R519 Headache, unspecified: Secondary | ICD-10-CM | POA: Insufficient documentation

## 2010-11-23 DIAGNOSIS — R51 Headache: Secondary | ICD-10-CM | POA: Insufficient documentation

## 2010-11-23 DIAGNOSIS — E119 Type 2 diabetes mellitus without complications: Secondary | ICD-10-CM

## 2010-11-23 DIAGNOSIS — M3500 Sicca syndrome, unspecified: Secondary | ICD-10-CM

## 2010-11-23 MED ORDER — DESONIDE 0.05 % EX GEL: Freq: Two times a day (BID) | CUTANEOUS | Status: DC

## 2010-11-23 MED ORDER — OMEPRAZOLE 40 MG PO CPDR: 40.0000 mg | DELAYED_RELEASE_CAPSULE | Freq: Two times a day (BID) | ORAL | Status: DC

## 2010-11-23 MED ORDER — HYDROCORTISONE ACETATE 25 MG RE SUPP: 25.0000 mg | Freq: Every day | RECTAL | Status: DC | PRN

## 2010-11-23 NOTE — Patient Instructions (Signed)
Call me if the headaches get worse and we can talk about using tramadol as needed.  Use the omeprazole in the meantime instead of prevacid.  Take care.  You can get your results through our phone system.  Follow the instructions on the blue card. Glad to see you today. Schedule a follow up appointment in: 3 months.  OV.   Take care.

## 2010-11-23 NOTE — Assessment & Plan Note (Addendum)
Continue current meds.  She had GI eval w/o cause seen.

## 2010-11-23 NOTE — Assessment & Plan Note (Addendum)
>  25 min spent with patient, at least half of which was spent on counseling.  Likely migrainous, possibly triggered by muscle spasms/trigger points.  She does have cervical trigger point tenderness on exam today.  She doesn't want to start new meds.  We talked about possible trigger and nonpharm intervention-see instructions.

## 2010-11-23 NOTE — Progress Notes (Signed)
GERD- prevacid recall.  Prev controlled on this med.  Asking about alternatives.  Will try omeprazole 40mg  po bid.   DM2- due for A1c.  Glucose controlled, 77 this AM.  Compliant with meds and diet.  No sig weight gain.   "My head isn't straight after the sinus infection in the fall."  Sneezing, congestion, ear pain continue.  Now with some jaw aches.  She saw dentist and had normal exam.  H/o migraines.  Pain at back of head. Some nausea with the pain.  No photophobia.  She does have occ phonophobia.  She can occ get a numbness on the scalp.  Rectal itching continues, some relief with prep H and occ hydrocortison supp.    R foot pain improving.  Has fu with foot clinic.   Meds, vitals, and allergies reviewed.   ROS: See HPI.  Otherwise, noncontributory.  GEN: nad, alert and oriented HEENT: mucous membranes moist, parotids slightly puffy, at baseline, tm wnl, nasal and oral exam w/o acute changes NECK: supple w/o LA CV: rrr PULM: ctab, no inc wob ABD: soft, +bs EXT: no edema SKIN: no acute rash CN 2-12 wnl B, s/s wnl grossly x4

## 2010-11-24 ENCOUNTER — Encounter: Payer: Self-pay | Admitting: Family Medicine

## 2010-11-24 NOTE — Assessment & Plan Note (Signed)
A1c at goal, has fu with endo later this year.

## 2010-11-24 NOTE — Assessment & Plan Note (Signed)
Unchanged. Will monitor.

## 2010-11-28 ENCOUNTER — Other Ambulatory Visit: Payer: Self-pay | Admitting: *Deleted

## 2010-11-28 MED ORDER — ZOLPIDEM TARTRATE 10 MG PO TABS: 10.0000 mg | ORAL_TABLET | Freq: Every evening | ORAL | Status: DC | PRN

## 2010-11-28 NOTE — Telephone Encounter (Signed)
Medication phoned to pharmacy.  

## 2010-11-28 NOTE — Telephone Encounter (Signed)
Please call in

## 2010-12-29 NOTE — H&P (Signed)
NAME:  Lori Zamora, Lori Zamora                        ACCOUNT NO.:  [id number]   MEDICAL RECORD NO.:  [id number]                   PATIENT TYPE:  INP   LOCATION:  NA                                   FACILITY:  [facility]   PHYSICIAN:  Duke Salvia. Marcelle Overlie, M.D.            DATE OF BIRTH:  1956-02-17   DATE OF ADMISSION:  DATE OF DISCHARGE:                                HISTORY & PHYSICAL   DATE OF SURGERY:  January 21, 2003   CHIEF COMPLAINT:  Pelvic pain, leiomyoma.   HPI:  A 55 year old, G0, P0.  The patient is not currently sexually active.  She is on a number of medicines per Dr. Abigail Miyamoto for chronic fatigue syndrome  and fibromyalgia.  She was noted to have increasing pelvic pain in the past  year.  A pelvic MRI and ultrasound were done in April of this year.  She was  noted to have a 3 x 3 cm fibroid.  Ultrasound here on November 26, 2002 showed  a uterus 7.6 x 5.6 x 4.3 with two fibroids.  The largest was 3.5 x 2.9.  Adnexa unremarkable.  This patient has never been pregnant.  She had  extremely poor dissent on exam, so decision was made to proceed with TAH-  BSO.  The procedure, including risks of bleeding, infection, transfusion,  adjacent organ injury, the possible need for ERT, was reviewed.  The  expected recovery time, the risks regarding phlebitis and wound infection  were discussed also.   PAST MEDICAL HISTORY:   ALLERGIES:  SULFA DRUGS and DIAMOX.   CURRENT MEDICATIONS:  1. Norvasc 5 mg q.d.  2. Zocor 80 mg q.d.  3. Prevacid 30 mg b.i.d.  4. She takes Elidel and Dermatop for acanthosis nigricans.  5. 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  6. For migraines she is on Cafergot, Reglan and Aleve.  7. She takes iron for thalassemia minor.   SURGERY:  None.   REVIEW OF SYSTEMS:  Also significant for diet-controlled diabetes.   PHYSICAL EXAMINATION:  VITAL SIGNS:  Temperature 98.2, blood pressure  130/80.  HEENT:  Unremarkable.  NECK:   Supple without masses.  LUNGS:  Clear.  CARDIOVASCULAR:  Regular rate and rhythm without murmurs, rubs or gallops.  BREASTS:  Without masses.  ABDOMEN:  Soft, flat, nontender.  PELVIC:  Normal external genitalia.  Vagina clear.  The uterus is upper  limits of normal in size.  Adnexa negative.  EXTREMITIES AND NEUROLOGIC EXAMS:  Unremarkable.    IMPRESSION:  Pelvic pain, symptomatic leiomyoma.   PLAN:  TAH-BSO.  The procedure and risks reviewed as above.  Richard M. Marcelle Overlie, M.D.    RMH/MEDQ  D:  01/18/2003  T:  01/18/2003  Job:  [id number]

## 2010-12-29 NOTE — Discharge Summary (Signed)
   NAME:  Lori Zamora, Lori Zamora                        ACCOUNT NO.:  [id number]   MEDICAL RECORD NO.:  [id number]                   PATIENT TYPE:  INP   LOCATION:  9325                                 FACILITY:  [facility]   PHYSICIAN:  Duke Salvia. Marcelle Overlie, M.D.            DATE OF BIRTH:  1956-08-12   DATE OF ADMISSION:  01/21/2003  DATE OF DISCHARGE:  01/23/2003                                 DISCHARGE SUMMARY   DISCHARGE DIAGNOSES:  1. Pelvic pain, leiomyoma.  2. Total abdominal hysterectomy, bilateral salpingo-oophorectomy this     admission.   SUMMARY OF THE HISTORY AND PHYSICAL EXAM:  Please see admission note for  details, briefly a 55 year old G0, P0 patient had symptomatic fibroids  presents now for definitive hysterectomy.   HOSPITAL COURSE:  On January 21, 2003 under epidural anesthesia the patient  underwent TAH/BSO without complication.  EBL was 100 cc.  First post-op day  the epidural catheter was removed.  Her diet was advanced.  She was  ambulating.  By the second post-op day she remained afebrile, was ready for  discharge at that point.  She had established normal bowel and urinary  function and was afebrile.   LAB DATA:  Preop hemoglobin 11.4, postop was 9.6.  C-MET normal except for a  glucose of 209.  Admission UA was negative.  Blood type is O positive.  Antibody screen was negative.   DISPOSITION:  Patient discharged on her routine medicines per Dr. Abigail Miyamoto.  She has Vicodin at home for pain that he has prescribed.  Will return to our  office in several days to have the clips removed.  Advised report any  incisional redness or drainage, increased pain or bleeding or fever over  101.  She was given some specific  instructions regarding diet and effective  exercise.   CONDITION:  Good.   ACTIVITY:  Gradual increase.                                                 Richard M. Marcelle Overlie, M.D.    RMH/MEDQ  D:  01/23/2003  T:  01/23/2003  Job:  [id number]   cc:   Chales Salmon.  Abigail Miyamoto, M.D.  [address]  [place]  [place] [address]  Fax: [phone]

## 2010-12-29 NOTE — Op Note (Signed)
NAME:  Lori Zamora, Lori Zamora                        ACCOUNT NO.:  [id number]   MEDICAL RECORD NO.:  [id number]                   PATIENT TYPE:  INP   LOCATION:  9399                                 [facility]ILITY:  [facility]   PHYSICIAN:  Duke Salvia. Marcelle Overlie, M.D.            DATE OF BIRTH:  28-Jul-1956   DATE OF PROCEDURE:  01/21/2003  DATE OF DISCHARGE:                                 OPERATIVE REPORT   PREOPERATIVE DIAGNOSES:  1. Pelvic pain.  2. Symptomatic leiomyoma.   POSTOPERATIVE DIAGNOSES:  1. Pelvic pain.  2. Symptomatic leiomyoma.   PROCEDURE:  TAH/BSO.   SURGEON:  Duke Salvia. Marcelle Overlie, M.D.   ASSISTANT:  Juluis Mire, M.D.   ANESTHESIA:  Epidural anesthesia.   ESTIMATED BLOOD LOSS:  100.   SPECIMENS:  Uterus, tubes and ovaries.   DESCRIPTION OF PROCEDURE:  The patient was taken to the operating room.  After an adequate level of epidural anesthesia was obtained, the abdomen,  perineum and vagina were prepped and draped in the usual manner for TAH/BSO.  A Foley catheter was positioned.  Attention was directed to the abdomen  where a Pfannenstiel incision was made two to three fingerbreadths above the  symphysis, carried down to the fascia which was incised and extended  transversely.  Rectus muscles divided in the midline.  Peritoneum entered  superiorly without incident and extended vertically.  The upper abdomen was  explored and noted to be unremarkable.  O'Connor-O'Sullivan retractors  positioned.  Bowels packed superiorly out of the field.  The uterus itself  had a posterior fibroid 3 to 4 cm and several smaller 1 or 2 cm fibroids.  Adnexa otherwise unremarkable.  There was no evidence of endometriosis.  Long Kelly clamps were then placed across each utero-ovarian pedicle.  Starting on the left, the round ligament was clamped, coagulated with the  LigaSure and divided.  Peritoneum carried around the midline.  The exact  same repeated on the opposite site.  The bladder was  then advanced  inferiorly with sharp and blunt dissection below the cervicovaginal  junction.  The retroperitoneal space on the left side was developed.  The  course of the left ureter was well below.  The left IP ligament was  isolated, clamped and divided with the LigaSure system.  This was repeated  on the opposite side also, thus both ovaries and tubes were removed.  The  uterine vasculature pedicles on either side were skeletonized, clamped,  coagulated and divided.  This was continued down to the uterosacral  ligament.  At that point, curved Heaney clamps were then used to clamp the  cervicovaginal angle and the specimen was excised.  These pedicles were  closed with 0 Dexon sutures in Heaney fashion.  The cuff was closed with  running locked 2-0 Dexon suture.  The pelvis was irrigated with saline.  All  major pedicles were inspected and noted to be hemostatic.  The  cecum and  appendix were unremarkable.  Prior to closure, sponge, needle and instrument  counts were reported as correct x2.  Peritoneum closed with running 2-0  Dexon suture.  Rectus muscles reapproximated in the midline  with 2-0 Dexon.  The 0 PDS was used from laterally to midline on either side  to close the fascia.  Subcutaneous fat was hemostasis.  Clips and Steri-  Strips used on the skin.  She tolerated this well and went to the recovery  room in good condition.                                               Richard M. Marcelle Overlie, M.D.    RMH/MEDQ  D:  01/21/2003  T:  01/21/2003  Job:  [id number]

## 2011-02-23 ENCOUNTER — Encounter: Payer: Self-pay | Admitting: Family Medicine

## 2011-02-23 ENCOUNTER — Ambulatory Visit (INDEPENDENT_AMBULATORY_CARE_PROVIDER_SITE_OTHER): Payer: BC Managed Care – PPO | Admitting: Family Medicine

## 2011-02-23 VITALS — BP 124/80 | HR 76 | Temp 98.2°F | Wt 163.8 lb

## 2011-02-23 DIAGNOSIS — R5383 Other fatigue: Secondary | ICD-10-CM

## 2011-02-23 DIAGNOSIS — E7800 Pure hypercholesterolemia, unspecified: Secondary | ICD-10-CM

## 2011-02-23 DIAGNOSIS — E785 Hyperlipidemia, unspecified: Secondary | ICD-10-CM

## 2011-02-23 DIAGNOSIS — M25519 Pain in unspecified shoulder: Secondary | ICD-10-CM

## 2011-02-23 DIAGNOSIS — R5381 Other malaise: Secondary | ICD-10-CM

## 2011-02-23 LAB — HEPATIC FUNCTION PANEL
ALT: 20 U/L (ref 0–35)
AST: 17 U/L (ref 0–37)
Alkaline Phosphatase: 107 U/L (ref 39–117)
Bilirubin, Direct: 0 mg/dL (ref 0.0–0.3)
Total Protein: 7.7 g/dL (ref 6.0–8.3)

## 2011-02-23 LAB — LIPID PANEL: Cholesterol: 164 mg/dL (ref 0–200)

## 2011-02-23 MED ORDER — PRAMOXINE-HC 1-2.5 % EX LOTN: TOPICAL_LOTION | CUTANEOUS | Status: DC

## 2011-02-23 NOTE — Patient Instructions (Signed)
 OV in 3 months.   You can get your results through our phone system.  Follow the instructions on the blue card. Take care.   Work on the shoulder exercises and let me know if the pain increases.  Glad to see you.

## 2011-02-23 NOTE — Progress Notes (Signed)
HLD- continues on meds w/o complication.  See notes on labs .   L shoulder pain.  Pain with certain movements.  Lateral shoulder pain.  No trauma. Going on for a few weeks.  No weakness.  Has had f/u with Dr. Nickola Major about rheum dx and will see Dr. Sharl Ma about DM2 soon.  Glucose controlled on home checks per patient.   CFS continues.  Diffuse trigger points tender.  "I'm trying, but it's hard."  She has continued, sig fatigue that is disabling, some days unable to leave residency.  Occ exercise, but this is limited and she feels worse the next few days after trying to exercise, ie walk.  Insomnia continues.   R foot pain is better and that has helped her R knee.    PMH and SH reviewed  Meds, vitals, and allergies reviewed.   ROS: See HPI.  Otherwise negative.    GEN: nad, alert and oriented HEENT: mucous membranes moist, parotid area slightly puffy x2, no malar rash NECK: supple w/o LA CV: rrr. PULM: ctab, no inc wob ABD: soft, +bs EXT: no edema SKIN: no acute rash, but occ hyperpigmented areas noted on arms and face.  L shoulder with impingement and pain on ext/int rotation. No arm drop.  Distally NV intact.

## 2011-02-25 ENCOUNTER — Encounter: Payer: Self-pay | Admitting: Family Medicine

## 2011-02-25 DIAGNOSIS — M25519 Pain in unspecified shoulder: Secondary | ICD-10-CM | POA: Insufficient documentation

## 2011-02-25 NOTE — Assessment & Plan Note (Signed)
Continues to have sx.  I talked with pt about tx.  At this point, I don't if anything can be added.  Continue as is, try to exercise as tolerated and continue current meds. She agrees.

## 2011-02-25 NOTE — Assessment & Plan Note (Signed)
Likely cuff strain.  GH feels intact.  D/w pt about rom exercise and handout given.  Call back if not improve.  I don't want her to get a frozen shoulder.  She is aware to keep moving.

## 2011-02-25 NOTE — Assessment & Plan Note (Signed)
Controlled, no change in meds.  See notes on labs.   

## 2011-02-26 ENCOUNTER — Telehealth: Payer: Self-pay | Admitting: *Deleted

## 2011-02-26 ENCOUNTER — Ambulatory Visit: Payer: BC Managed Care – PPO | Admitting: Family Medicine

## 2011-02-26 ENCOUNTER — Other Ambulatory Visit: Payer: Self-pay | Admitting: Family Medicine

## 2011-02-26 DIAGNOSIS — Z1231 Encounter for screening mammogram for malignant neoplasm of breast: Secondary | ICD-10-CM

## 2011-02-26 NOTE — Telephone Encounter (Signed)
Opened in error

## 2011-03-01 ENCOUNTER — Other Ambulatory Visit: Payer: Self-pay | Admitting: *Deleted

## 2011-03-01 MED ORDER — AMLODIPINE BESYLATE 10 MG PO TABS: 10.0000 mg | ORAL_TABLET | Freq: Every day | ORAL | Status: DC

## 2011-03-01 MED ORDER — LANSOPRAZOLE 30 MG PO CPDR: 30.0000 mg | DELAYED_RELEASE_CAPSULE | Freq: Two times a day (BID) | ORAL | Status: DC

## 2011-03-01 MED ORDER — VALSARTAN 80 MG PO TABS: 80.0000 mg | ORAL_TABLET | Freq: Every day | ORAL | Status: DC

## 2011-03-16 ENCOUNTER — Other Ambulatory Visit: Payer: Self-pay | Admitting: *Deleted

## 2011-03-16 MED ORDER — ATORVASTATIN CALCIUM 20 MG PO TABS: 60.0000 mg | ORAL_TABLET | Freq: Every day | ORAL | Status: DC

## 2011-03-19 ENCOUNTER — Ambulatory Visit
Admission: RE | Admit: 2011-03-19 | Discharge: 2011-03-19 | Disposition: A | Discharge status: Home / Self Care | Payer: BC Managed Care – PPO | Source: Ambulatory Visit | Attending: Family Medicine | Admitting: Family Medicine

## 2011-03-19 DIAGNOSIS — Z1231 Encounter for screening mammogram for malignant neoplasm of breast: Secondary | ICD-10-CM

## 2011-03-20 ENCOUNTER — Encounter: Payer: Self-pay | Admitting: *Deleted

## 2011-04-02 ENCOUNTER — Other Ambulatory Visit: Payer: Self-pay | Admitting: Internal Medicine

## 2011-04-02 DIAGNOSIS — E042 Nontoxic multinodular goiter: Secondary | ICD-10-CM

## 2011-04-04 ENCOUNTER — Ambulatory Visit
Admission: RE | Admit: 2011-04-04 | Discharge: 2011-04-04 | Disposition: A | Discharge status: Home / Self Care | Payer: BC Managed Care – PPO | Source: Ambulatory Visit | Attending: Internal Medicine | Admitting: Internal Medicine

## 2011-04-04 DIAGNOSIS — E042 Nontoxic multinodular goiter: Secondary | ICD-10-CM

## 2011-05-25 ENCOUNTER — Encounter: Payer: Self-pay | Admitting: Family Medicine

## 2011-05-25 ENCOUNTER — Ambulatory Visit (INDEPENDENT_AMBULATORY_CARE_PROVIDER_SITE_OTHER): Payer: BC Managed Care – PPO | Admitting: Family Medicine

## 2011-05-25 VITALS — BP 122/70 | HR 86 | Temp 98.2°F | Wt 159.0 lb

## 2011-05-25 DIAGNOSIS — M25519 Pain in unspecified shoulder: Secondary | ICD-10-CM

## 2011-05-25 DIAGNOSIS — Z23 Encounter for immunization: Secondary | ICD-10-CM

## 2011-05-25 DIAGNOSIS — E119 Type 2 diabetes mellitus without complications: Secondary | ICD-10-CM

## 2011-05-25 DIAGNOSIS — R5383 Other fatigue: Secondary | ICD-10-CM

## 2011-05-25 DIAGNOSIS — R5381 Other malaise: Secondary | ICD-10-CM

## 2011-05-25 DIAGNOSIS — G4730 Sleep apnea, unspecified: Secondary | ICD-10-CM

## 2011-05-25 NOTE — Progress Notes (Signed)
"  I'm still going down."  Still with frequent flu like sx, but no fevers.  "Everything hurts."  She is still profoundly fatigued.  More parotid swelling and tenderness from Sjrogen's syndrome.  She felt like she was more aware of her breathing.  No known wheeze. She can get a deep breath.  She felt like her breathing was more shallow than normal.  No cough.  She does have some post nasal gtt with some throat clearing.  Her breathing doesn't clearly restrict her activity, but her activity was always limited by chronic fatigue.  She is still frequently cold.  She's in the midst of OSA reeval, she lost weight and CPAP setting may need to be changed.    She had f/u re: DM.  Labs looked fine. See scanned copies.  She had f/u with gyn and had abnormal cells seen on pap.  It was unclear if this was related to vaginal dryness.  The pap will be repeated in 6 months per gyn.    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   L shoulder pain.  She's still doing her exercises for L shoulder.  It is minimally better, but she is interested in PT.  Her shoulder doesn't appear to be frozen.  Pain with certain movement and positions.  No trauma.    Meds, vitals, and allergies reviewed.   ROS: See HPI.  Otherwise, noncontributory.  nad Affect wnl, speech fluent Mm mildly dry Neck supple Parotid enlargement noted Neck w/o LA rrr ctab L shoulder not frozed but pain with ext >int rotation, L scap assist with ext rotation noted.   + impingement but distally NV intact  Diabetic foot exam: Normal inspection No skin breakdown No calluses  Normal DP pulses Normal sensation to light touch and monofilament Nails normal

## 2011-05-25 NOTE — Patient Instructions (Signed)
See Shirlee Limerick about your referral before your leave today. Don't change your meds and let me know if you have concerns in the meantime.   I'd like to see you back in 3 months.  visit.   Take care.

## 2011-05-27 ENCOUNTER — Encounter: Payer: Self-pay | Admitting: Family Medicine

## 2011-05-27 NOTE — Assessment & Plan Note (Signed)
Refer to PT and she'll continue with home exercise.  >25 min spent with face to face with patient, >50% counseling and/or coordinating care.

## 2011-05-27 NOTE — Assessment & Plan Note (Signed)
Continue to attempt to treat sx.  Plan for f/u in 3 months.

## 2011-05-27 NOTE — Assessment & Plan Note (Signed)
She has f/u with pulm.  I don't know if this or the sjrogen's has an effect on her sensation of breathing, ie with dry mouth/upper airway.  Her peak was 400 today and we'll follow this as she has no wheeze and isn't hypoxic. She agrees.

## 2011-05-27 NOTE — Assessment & Plan Note (Signed)
A1c controlled, no change in meds.  Labs reviewed.

## 2011-05-29 ENCOUNTER — Ambulatory Visit: Payer: BC Managed Care – PPO | Admitting: Physical Therapy

## 2011-06-28 ENCOUNTER — Other Ambulatory Visit: Payer: Self-pay | Admitting: Family Medicine

## 2011-06-30 NOTE — Telephone Encounter (Signed)
Please call in

## 2011-07-01 ENCOUNTER — Encounter: Payer: Self-pay | Admitting: Family Medicine

## 2011-07-02 NOTE — Telephone Encounter (Signed)
rx called to pharmacy 

## 2011-07-10 ENCOUNTER — Telehealth: Payer: Self-pay | Admitting: *Deleted

## 2011-07-10 ENCOUNTER — Encounter: Payer: Self-pay | Admitting: Family Medicine

## 2011-07-10 DIAGNOSIS — G4700 Insomnia, unspecified: Secondary | ICD-10-CM | POA: Insufficient documentation

## 2011-07-10 NOTE — Telephone Encounter (Signed)
Faxed.  Copy of form given back to [place].

## 2011-07-10 NOTE — Telephone Encounter (Signed)
Prior auth is needed for zolpidem, form is on your desk. 

## 2011-07-10 NOTE — Telephone Encounter (Signed)
Filled out.  Please send in.

## 2011-08-08 ENCOUNTER — Encounter: Payer: Self-pay | Admitting: Family Medicine

## 2011-08-27 ENCOUNTER — Ambulatory Visit (INDEPENDENT_AMBULATORY_CARE_PROVIDER_SITE_OTHER)
Admission: RE | Admit: 2011-08-27 | Discharge: 2011-08-27 | Disposition: A | Discharge status: Home / Self Care | Payer: BC Managed Care – PPO | Source: Ambulatory Visit | Attending: Family Medicine | Admitting: Family Medicine

## 2011-08-27 ENCOUNTER — Ambulatory Visit (INDEPENDENT_AMBULATORY_CARE_PROVIDER_SITE_OTHER): Payer: BC Managed Care – PPO | Admitting: Family Medicine

## 2011-08-27 ENCOUNTER — Encounter: Payer: Self-pay | Admitting: Family Medicine

## 2011-08-27 VITALS — BP 110/70 | HR 80 | Temp 98.6°F | Wt 162.5 lb

## 2011-08-27 DIAGNOSIS — R5381 Other malaise: Secondary | ICD-10-CM

## 2011-08-27 DIAGNOSIS — R0609 Other forms of dyspnea: Secondary | ICD-10-CM

## 2011-08-27 DIAGNOSIS — R0600 Dyspnea, unspecified: Secondary | ICD-10-CM

## 2011-08-27 MED ORDER — KETOCONAZOLE 2 % EX CREA: TOPICAL_CREAM | Freq: Two times a day (BID) | CUTANEOUS | Status: DC

## 2011-08-27 MED ORDER — ALBUTEROL SULFATE HFA 108 (90 BASE) MCG/ACT IN AERS: 2.0000 | INHALATION_SPRAY | Freq: Four times a day (QID) | RESPIRATORY_TRACT | Status: DC | PRN

## 2011-08-27 NOTE — Progress Notes (Signed)
"  I'm not doing well and I'm not really happy.  This has been going on 30 years."  Was dx'd with EBV.  She had seen Dr. Abigail Miyamoto and was dx'd with chronic fatigue syndrome.  Finally ANA was positive.  Before I had seen her, "things started changing."  She had rheumatology and derm f/u, possible scleroderma.  Then saw dental clinic, parotid enlargement noted and was thought to possibly have Sjogren's syndrome.  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  Dx'd with tumid lupus, also Sjogren's.    Since 8 months ago, she has felt more and more fatigued.  She had sleep study and now if off CPAP.  She had more fatigue and dark discoloration on the neck. Topical tx helped some.  She then had more TMJ pain and trouble talking due to pain in jaw.  Now also with worsening dyspnea:  We were going to follow her peak flows here in clinic.  She has had more dyspnea with walking.  No wheeze.  She feels like she can't get a good breath.  "It feels like 1 lung isn't working".  Feels tightness in chest.    She thought she was hypoglycemic, but her sugar was normal.  She's had more muscle weakness, esp in the hips, with trouble doing up stairs. She's had diffuse myalgias.  She has early satiety.  Hands and feet get cold, having to wear gloves to bed.  She most recently saw Dr. Nickola Major with rheum.   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   PMH and SH reviewed  ROS: See HPI, otherwise noncontributory.  Meds, vitals, and allergies reviewed.   nad but tearful discussing her symptoms ncat Parotid enlargement noted, but less than at prev visits Mm mildly dry rrr Ctab, no wheeze Abd soft Skin proximal to nail bed is darker than prev

## 2011-08-27 NOTE — Patient Instructions (Signed)
I'll call the rheum clinic.  See Shirlee Limerick about your referral before you leave today. Use the inhaler every 6 hours as needed and see if that helps the breathing symptoms.   OV in 3 months .

## 2011-08-31 ENCOUNTER — Telehealth: Payer: Self-pay | Admitting: Family Medicine

## 2011-08-31 DIAGNOSIS — R0600 Dyspnea, unspecified: Secondary | ICD-10-CM | POA: Insufficient documentation

## 2011-08-31 NOTE — Assessment & Plan Note (Signed)
I'll call over to Dr. Nickola Major and discuss. I will need rheum input.  Pt would like to know if she can be officially dx'd as scleroderma.

## 2011-08-31 NOTE — Telephone Encounter (Signed)
I called Dr. Nickola Major.  Scleroderma is still in the ddx.  She agrees with pulm referral.  I don't know if she has pulm htn or an interstitial component.  The SABA has made minimal effect.  I would greatly appreciate pulm input on her dyspnea.  I called patient and talked with her about this.

## 2011-08-31 NOTE — Assessment & Plan Note (Signed)
With peak flow of 310.  I think technique depressed her peak flow.  However, given the other issues, I don't want to overlook chest wall or diaphragm weakness that could be contributing.  I would like pulm input.  Pt agrees to referral.  >45 min spent with face to face with patient, >50% counseling and/or coordinating care.

## 2011-09-12 ENCOUNTER — Encounter: Payer: Self-pay | Admitting: Pulmonary Disease

## 2011-09-12 ENCOUNTER — Ambulatory Visit (INDEPENDENT_AMBULATORY_CARE_PROVIDER_SITE_OTHER): Payer: BC Managed Care – PPO | Admitting: Pulmonary Disease

## 2011-09-12 VITALS — BP 110/72 | HR 88 | Temp 98.6°F | Ht 65.5 in | Wt 164.6 lb

## 2011-09-12 DIAGNOSIS — R0989 Other specified symptoms and signs involving the circulatory and respiratory systems: Secondary | ICD-10-CM

## 2011-09-12 DIAGNOSIS — R0600 Dyspnea, unspecified: Secondary | ICD-10-CM

## 2011-09-12 NOTE — Assessment & Plan Note (Signed)
The patient has a six-month history of progressive dyspnea on exertion, but there is nothing by history or exam which is pointing [place] to a specific etiology.  I think she needs to have pulmonary function studies, as well as muscle pressures given her history of undifferentiated connective tissue disorder.  If this is unrevealing, I would recommend an echocardiogram to evaluate for pulmonary hypertension, and also cardiac sources of dyspnea.  If this is unrevealing, the patient will have to decide about a trial for 3-4 months of a conditioning program versus proceeding with a cardiopulmonary exercise test.  I will see her back after she has had her PFTs.

## 2011-09-12 NOTE — Progress Notes (Signed)
  Subjective:    Patient ID: Lori Zamora, female    DOB: 09-25-1955, 56 y.o.   MRN: [id number]  HPI The patient is a 56 year old female who I've been asked to see for dyspnea.  The patient states that she has had breathing issues for the last 6 months, and feels they are getting worse.  Her shortness of breath can occur with exertion, but also at rest.  She feels that she cannot "get a deep breath", and also that it is "hard to yawn".  The patient describes a 1-2 block dyspnea on exertion at a moderate pace on flat ground.  She will also get winded doing light housework or walking up one flight of stairs.  She has no history of pulmonary disease, but has been diagnosed with some type of undifferentiated connective tissue disorder.  She denies any cough, congestion, or mucus production.  She does have intermittent lower extremity edema.  She denies isolated proximal muscle weakness.  The patient states that her weight is in neutral over the last one year, and that she does not do any kind of exercise on a regular basis.  The patient has been tried on an inhaler, and really saw no difference.  She had a chest x-ray done approximately 2 weeks ago that was within normal limits.   Review of Systems  Constitutional: Negative for fever and unexpected weight change.  HENT: Positive for congestion, sneezing, trouble swallowing and dental problem. Negative for ear pain, nosebleeds, sore throat, rhinorrhea, postnasal drip and sinus pressure.   Eyes: Negative for redness and itching.  Respiratory: Positive for shortness of breath. Negative for cough, chest tightness and wheezing.   Cardiovascular: Positive for chest pain and leg swelling. Negative for palpitations.  Gastrointestinal: Positive for abdominal pain. Negative for nausea and vomiting.  Genitourinary: Negative for dysuria.  Musculoskeletal: Positive for joint swelling.  Skin: Positive for rash.  Neurological: Positive for headaches.    Hematological: Does not bruise/bleed easily.  Psychiatric/Behavioral: Positive for dysphoric mood. The patient is nervous/anxious.        Objective:   Physical Exam Constitutional:  Well developed, no acute distress  HENT:  Nares patent without discharge  Oropharynx without exudate, palate and uvula are normal  Eyes:  Perrla, eomi, no scleral icterus  Neck:  No JVD, no TMG  Cardiovascular:  Normal rate, regular rhythm, no rubs or gallops.  No murmurs        Intact distal pulses  Pulmonary :  Normal breath sounds, no stridor or respiratory distress   No rales, rhonchi, or wheezing  Abdominal:  Soft, nondistended, bowel sounds present.  No tenderness noted.   Musculoskeletal:  Minimal lower extremity edema noted.  Lymph Nodes:  No cervical lymphadenopathy noted  Skin:  No cyanosis noted  Neurologic:  Alert, appropriate, moves all 4 extremities without obvious deficit.         Assessment & Plan:

## 2011-09-12 NOTE — Patient Instructions (Signed)
Will schedule for breathing studies and muscle pressures, and see you back same day for review.

## 2011-10-03 ENCOUNTER — Other Ambulatory Visit: Payer: Self-pay

## 2011-10-03 ENCOUNTER — Ambulatory Visit (HOSPITAL_COMMUNITY)
Admission: RE | Admit: 2011-10-03 | Discharge: 2011-10-03 | Disposition: A | Discharge status: Home / Self Care | Payer: BC Managed Care – PPO | Source: Ambulatory Visit | Attending: Pulmonary Disease | Admitting: Pulmonary Disease

## 2011-10-03 ENCOUNTER — Other Ambulatory Visit: Payer: Self-pay | Admitting: Pulmonary Disease

## 2011-10-03 ENCOUNTER — Encounter: Payer: Self-pay | Admitting: Pulmonary Disease

## 2011-10-03 ENCOUNTER — Ambulatory Visit (INDEPENDENT_AMBULATORY_CARE_PROVIDER_SITE_OTHER): Payer: BC Managed Care – PPO | Admitting: Pulmonary Disease

## 2011-10-03 VITALS — BP 112/64 | HR 86 | Temp 98.3°F | Ht 65.0 in | Wt 160.0 lb

## 2011-10-03 DIAGNOSIS — R0989 Other specified symptoms and signs involving the circulatory and respiratory systems: Secondary | ICD-10-CM | POA: Insufficient documentation

## 2011-10-03 DIAGNOSIS — R0600 Dyspnea, unspecified: Secondary | ICD-10-CM

## 2011-10-03 DIAGNOSIS — R0609 Other forms of dyspnea: Secondary | ICD-10-CM

## 2011-10-03 LAB — PULMONARY FUNCTION TEST

## 2011-10-03 MED ORDER — LANSOPRAZOLE 15 MG PO CPDR: 30.0000 mg | DELAYED_RELEASE_CAPSULE | Freq: Two times a day (BID) | ORAL | Status: DC

## 2011-10-03 MED ORDER — LANSOPRAZOLE 30 MG PO CPDR: 30.0000 mg | DELAYED_RELEASE_CAPSULE | Freq: Two times a day (BID) | ORAL | Status: DC

## 2011-10-03 NOTE — Telephone Encounter (Signed)
From the way it was worded, I thought she was taking 30mg  x4 a day.  Will change back to 15mg  , 2 tabs bid.  Will send in.  Please cancel the prev rx.  Thanks.

## 2011-10-03 NOTE — Telephone Encounter (Signed)
Patient is requesting a refill on her Prevacid 30 mg.  She states that she takes it four times a day and needs a quantity of 120 with refills.  I don't see those directions on her last filled script.  Is it okay to change and send? Pharmacy is [facility].

## 2011-10-03 NOTE — Assessment & Plan Note (Signed)
The patient has had pulmonary function studies which showed no airflow obstruction, no restriction, and a normal diffusion capacity.  Her inspiratory muscle pressures were also normal.  I see no obvious pulmonary etiology for her shortness of breath, however given her history of autoimmune disease, I would recommend an echocardiogram to evaluate for pulmonary hypertension to be complete.  If this is unremarkable, would recommend weight loss and conditioning over a 3-4 month period to see if things improve.  We can always check a cardiopulmonary exercise test as the next step if the patient wishes to continue being aggressive in our workup.

## 2011-10-03 NOTE — Telephone Encounter (Signed)
I thought she was taking it 1 bid.  I wouldn't go above that for now, I would only take 2 a day.  I would try a period of bid dosing.  I sent in 180 for 90 day supply.  Let me know if she doesn't do well with that.

## 2011-10-03 NOTE — Progress Notes (Signed)
  Subjective:    Patient ID: Lori Zamora, female    DOB: 1955/11/22, 56 y.o.   MRN: [id number]  HPI The patient comes in today for followup after her recent pulmonary function studies.  She was found to have no obstruction, no restriction, and a normal diffusion capacity.  Her inspiratory muscle pressures were also normal.  I have reviewed the patient's study with her in detail, and answered all of her questions.   Review of Systems  Constitutional: Negative for fever and unexpected weight change.  HENT: Positive for congestion, sneezing and trouble swallowing. Negative for ear pain, nosebleeds, sore throat, rhinorrhea, dental problem, postnasal drip and sinus pressure.   Eyes: Negative for redness and itching.  Respiratory: Positive for chest tightness and shortness of breath. Negative for cough and wheezing.   Cardiovascular: Positive for leg swelling. Negative for palpitations.  Gastrointestinal: Negative for nausea and vomiting.  Genitourinary: Negative for dysuria.  Musculoskeletal: Negative for joint swelling.  Skin: Positive for rash.  Neurological: Positive for headaches.  Hematological: Bruises/bleeds easily.  Psychiatric/Behavioral: Negative for dysphoric mood. The patient is not nervous/anxious.        Objective:   Physical Exam Overweight female in no acute distress Nose without purulence or discharge noted Chest clear Lower extremities without significant edema, no cyanosis Alert and oriented, moves all 4 extremities.       Assessment & Plan:

## 2011-10-03 NOTE — Telephone Encounter (Signed)
Patient notified as instructed by telephone. Was advised by patient that she has to get the OTC Prevacid 15 mg and she takes 2 two times a day to get a total of 30 mg with each dose. Patient states that this has to go in as a script showing OTC Prevacid 15 mg two twice a day. Patient states that the pharmacist pulls this from over the counter and fills it so that her insurance will cover it. Patient states that she has been taking the Prevacid like this for a very long time.

## 2011-10-03 NOTE — Telephone Encounter (Signed)
Spoke to [place] at the pharmacy and informed him to cancel the previous script per Dr. Para March. Was informed by [facility] that this has already been taken care of.  Patient notified that correct script has been sent to the pharmacy.  Patient stated that she probably confused everyone because of the way that she stated she was taking the medication. Patient stated that she has already picked the correct script up from the pharmacy.

## 2011-10-03 NOTE — Patient Instructions (Signed)
Your breathing studies and muscle pressures are normal.  Great news! Will schedule for echo of your heart to evaluate for possible pulmonary hypertension.  I will call you with results.  If unremarkable, would recommend working on weight reduction and conditioning before proceeding with further testing.

## 2011-10-10 ENCOUNTER — Other Ambulatory Visit: Payer: Self-pay

## 2011-10-10 ENCOUNTER — Ambulatory Visit (HOSPITAL_COMMUNITY): Payer: BC Managed Care – PPO | Attending: Cardiology

## 2011-10-10 DIAGNOSIS — I059 Rheumatic mitral valve disease, unspecified: Secondary | ICD-10-CM | POA: Insufficient documentation

## 2011-10-10 DIAGNOSIS — I079 Rheumatic tricuspid valve disease, unspecified: Secondary | ICD-10-CM | POA: Insufficient documentation

## 2011-10-10 DIAGNOSIS — R0600 Dyspnea, unspecified: Secondary | ICD-10-CM

## 2011-10-10 DIAGNOSIS — R0609 Other forms of dyspnea: Secondary | ICD-10-CM

## 2011-10-10 DIAGNOSIS — I379 Nonrheumatic pulmonary valve disorder, unspecified: Secondary | ICD-10-CM | POA: Insufficient documentation

## 2011-10-10 DIAGNOSIS — R0989 Other specified symptoms and signs involving the circulatory and respiratory systems: Secondary | ICD-10-CM | POA: Insufficient documentation

## 2011-10-10 DIAGNOSIS — R079 Chest pain, unspecified: Secondary | ICD-10-CM | POA: Insufficient documentation

## 2011-11-27 ENCOUNTER — Ambulatory Visit (INDEPENDENT_AMBULATORY_CARE_PROVIDER_SITE_OTHER): Payer: BC Managed Care – PPO | Admitting: Family Medicine

## 2011-11-27 ENCOUNTER — Encounter: Payer: Self-pay | Admitting: *Deleted

## 2011-11-27 ENCOUNTER — Encounter: Payer: Self-pay | Admitting: Family Medicine

## 2011-11-27 VITALS — BP 118/80 | HR 89 | Temp 98.6°F | Wt 158.8 lb

## 2011-11-27 DIAGNOSIS — R5381 Other malaise: Secondary | ICD-10-CM

## 2011-11-27 DIAGNOSIS — E119 Type 2 diabetes mellitus without complications: Secondary | ICD-10-CM

## 2011-11-27 DIAGNOSIS — R5383 Other fatigue: Secondary | ICD-10-CM

## 2011-11-27 DIAGNOSIS — E785 Hyperlipidemia, unspecified: Secondary | ICD-10-CM

## 2011-11-27 LAB — COMPREHENSIVE METABOLIC PANEL
Albumin: 4.6 g/dL (ref 3.5–5.2)
Alkaline Phosphatase: 110 U/L (ref 39–117)
BUN: 12 mg/dL (ref 6–23)
Glucose, Bld: 78 mg/dL (ref 70–99)
Total Bilirubin: 0.1 mg/dL — ABNORMAL LOW (ref 0.3–1.2)

## 2011-11-27 LAB — LIPID PANEL
Cholesterol: 188 mg/dL (ref 0–200)
HDL: 52.2 mg/dL (ref >39.00)
Triglycerides: 117 mg/dL (ref 0.0–149.0)
VLDL: 23.4 mg/dL (ref 0.0–40.0)

## 2011-11-27 NOTE — Patient Instructions (Signed)
Go to the lab on the way out.   We'll contact you with your lab report. Take care.  Let's get back together in 6 months, sooner if needed.   30 min office visit.

## 2011-11-27 NOTE — Progress Notes (Signed)
She's was having HA for about 1 month, but that has gotten better.  She had f/u with the dentist about her jaw and she has a bite guard.  Still with some pain/trouble chewing and talking.    Her ROM is better in the L shoulder.  Chronic fatigue, connective tissue disorder.  She is aching more than normal now.  She will have episodic flares.  She had pulm eval for SOB, no pulm cause found and echo was normal.  The sob isn't much better.  It may have gotten worse but this doesn't seem to be due to the spring season change.  She wanted to talk about options from this point on.   Elevated Cholesterol: Using medications without problems:yes Muscle aches:  As above, at baseline Diet compliance:yes Exercise: limited   PMH and SH reviewed  Meds, vitals, and allergies reviewed.   ROS: See HPI.  Otherwise negative.    GEN: nad, alert and oriented HEENT: mucous membranes moist, mild parotid enlargement noted x2 NECK: supple w/o LA CV: rrr. PULM: ctab, no inc wob ABD: soft, +bs EXT: no edema SKIN: no acute rash

## 2011-11-29 ENCOUNTER — Encounter: Payer: Self-pay | Admitting: Family Medicine

## 2011-11-29 NOTE — Assessment & Plan Note (Signed)
Continue current meds, see notes on labs.  

## 2011-11-29 NOTE — Assessment & Plan Note (Signed)
Continue current meds at this point.  She had some information related to possible MD eval at [facility] and I asked her to pass this along.  She wanted to continue with symptom treatment through the clinic here and we'll attempt this.  >25 min spent with face to face with patient, >50% counseling and/or coordinating care

## 2011-12-27 ENCOUNTER — Other Ambulatory Visit: Payer: Self-pay | Admitting: Family Medicine

## 2011-12-28 ENCOUNTER — Other Ambulatory Visit: Payer: Self-pay | Admitting: Family Medicine

## 2011-12-28 NOTE — Telephone Encounter (Signed)
Please call in.  Thanks.   

## 2011-12-28 NOTE — Telephone Encounter (Signed)
Medication phoned to pharmacy.  

## 2012-01-28 ENCOUNTER — Other Ambulatory Visit: Payer: Self-pay | Admitting: Family Medicine

## 2012-01-28 NOTE — Telephone Encounter (Signed)
Electronic refill request

## 2012-01-28 NOTE — Telephone Encounter (Signed)
Sent!

## 2012-02-27 ENCOUNTER — Other Ambulatory Visit: Payer: Self-pay | Admitting: Family Medicine

## 2012-03-06 ENCOUNTER — Other Ambulatory Visit: Payer: Self-pay | Admitting: Family Medicine

## 2012-03-06 DIAGNOSIS — Z1231 Encounter for screening mammogram for malignant neoplasm of breast: Secondary | ICD-10-CM

## 2012-03-26 ENCOUNTER — Ambulatory Visit
Admission: RE | Admit: 2012-03-26 | Discharge: 2012-03-26 | Disposition: A | Discharge status: Home / Self Care | Payer: BC Managed Care – PPO | Source: Ambulatory Visit | Attending: Family Medicine | Admitting: Family Medicine

## 2012-03-26 DIAGNOSIS — Z1231 Encounter for screening mammogram for malignant neoplasm of breast: Secondary | ICD-10-CM

## 2012-03-27 ENCOUNTER — Encounter: Payer: Self-pay | Admitting: *Deleted

## 2012-03-27 ENCOUNTER — Other Ambulatory Visit: Payer: Self-pay | Admitting: Family Medicine

## 2012-04-08 ENCOUNTER — Other Ambulatory Visit: Payer: Self-pay | Admitting: Internal Medicine

## 2012-04-08 DIAGNOSIS — E049 Nontoxic goiter, unspecified: Secondary | ICD-10-CM

## 2012-04-10 ENCOUNTER — Ambulatory Visit
Admission: RE | Admit: 2012-04-10 | Discharge: 2012-04-10 | Disposition: A | Discharge status: Home / Self Care | Payer: BC Managed Care – PPO | Source: Ambulatory Visit | Attending: Internal Medicine | Admitting: Internal Medicine

## 2012-04-10 DIAGNOSIS — E049 Nontoxic goiter, unspecified: Secondary | ICD-10-CM

## 2012-04-17 ENCOUNTER — Encounter: Payer: Self-pay | Admitting: Family Medicine

## 2012-04-17 DIAGNOSIS — E042 Nontoxic multinodular goiter: Secondary | ICD-10-CM | POA: Insufficient documentation

## 2012-04-27 ENCOUNTER — Other Ambulatory Visit: Payer: Self-pay | Admitting: Family Medicine

## 2012-04-28 NOTE — Telephone Encounter (Signed)
Electronic refill request.  Please advise. 

## 2012-04-28 NOTE — Telephone Encounter (Signed)
Sent!

## 2012-05-29 ENCOUNTER — Ambulatory Visit (INDEPENDENT_AMBULATORY_CARE_PROVIDER_SITE_OTHER): Payer: BC Managed Care – PPO | Admitting: Family Medicine

## 2012-05-29 ENCOUNTER — Encounter: Payer: Self-pay | Admitting: Family Medicine

## 2012-05-29 VITALS — BP 114/72 | HR 95 | Temp 98.5°F | Wt 164.0 lb

## 2012-05-29 DIAGNOSIS — G4700 Insomnia, unspecified: Secondary | ICD-10-CM

## 2012-05-29 DIAGNOSIS — R5383 Other fatigue: Secondary | ICD-10-CM

## 2012-05-29 DIAGNOSIS — R5381 Other malaise: Secondary | ICD-10-CM

## 2012-05-29 DIAGNOSIS — Z23 Encounter for immunization: Secondary | ICD-10-CM

## 2012-05-29 NOTE — Patient Instructions (Addendum)
Take ibuprofen 200-400 mg up to 3 times a day with food.   Work on the straight leg raises, externally rotated straight leg raises, side leg lifts, along with balance exercises.  Recheck in 6 months.  visit. Take care.

## 2012-05-30 ENCOUNTER — Telehealth: Payer: Self-pay

## 2012-05-30 ENCOUNTER — Encounter: Payer: Self-pay | Admitting: Family Medicine

## 2012-05-30 NOTE — Assessment & Plan Note (Signed)
Also discussed today.  No ADE from [place].  This is the only medicine she has tried that provided any help.  She'll need PA redone. Will start this process.

## 2012-05-30 NOTE — Assessment & Plan Note (Signed)
With diffuse symptoms.  Will continue to try treat symptomatically.  I don't see synovitis on her hands and she wouldn't want aggressive treatment.  Imaging wouldn't change mgmt.  She doesn't want invasive testing.  D/w pt about low dose ibuprofen, 200-400mg  up to tid, GI caution.  She'll work on leg lifts and balance exercises to see if that can help her BLE strength.  We'll follow clinically.  She agrees with plan.  Recheck 6 months.  >25 min spent with face to face with patient, >50% counseling and/or coordinating care

## 2012-05-30 NOTE — Progress Notes (Signed)
F/u for CFS, connective tissue disease.  She still has diffuse aches, neck to lower back.  Her hips are stiff and her L 5th PIP and L 1st IP joint are often tender but w/o erythema.  Her face continues to be puffy, esp on forehead and near the eyes.  She has dry itchy eye and dry mouth.  Her TMJ is some better with exercises.  Her ankles are often puffy.  Her appetite waxes and wanes- she'll d/w endo about this.  She continues to have exercise intolerance and persistent fatigue.   She had f/u with another rheum clinic but the interaction wasn't useful for patient.  She had declined plaquenil treatment.    She has been compliant with other meds.  Recent labs reviewed.    PMH and SH reviewed  ROS: See HPI, otherwise noncontributory.  Meds, vitals, and allergies reviewed.   GEN: nad, alert and oriented, speech wnl HEENT: mucous membranes moist, mild parotid enlargement noted x2 with hyperpigmentation noted inferior to L>R eye.  NECK: supple w/o LA  EXT: trace edema on the ankles.  She has no active synovitis on the hands, no erythema

## 2012-05-30 NOTE — Telephone Encounter (Signed)
Letter received from [facility] script pts PA for Zolpidem will expire 07/09/12. Called [phone] spoke with Tim. Approval over phone 05/09/12 thru 05/30/13. Confirmation # [id number]. [facility] notified spoke with [employer].

## 2012-06-06 ENCOUNTER — Other Ambulatory Visit: Payer: Self-pay

## 2012-06-06 MED ORDER — PRAMOXINE-HC 1-2.5 % EX CREA: TOPICAL_CREAM | Freq: Three times a day (TID) | CUTANEOUS | Status: DC

## 2012-06-06 NOTE — Telephone Encounter (Signed)
Pt request Pramosone 2.5% cream with instructions apply daily every other week to [facility]. (pt alternating this cream and other creams) Pt does not want Lotion; pt said lotion does not work as well as cream.Please advise.

## 2012-06-06 NOTE — Telephone Encounter (Signed)
Sent!

## 2012-06-06 NOTE — Telephone Encounter (Signed)
Patient advised.

## 2012-06-14 ENCOUNTER — Other Ambulatory Visit: Payer: Self-pay | Admitting: Family Medicine

## 2012-07-29 ENCOUNTER — Encounter: Payer: Self-pay | Admitting: Internal Medicine

## 2012-07-29 ENCOUNTER — Ambulatory Visit (INDEPENDENT_AMBULATORY_CARE_PROVIDER_SITE_OTHER): Payer: BC Managed Care – PPO | Admitting: Internal Medicine

## 2012-07-29 VITALS — BP 116/76 | HR 90 | Temp 98.1°F | Resp 14 | Ht 65.25 in | Wt 166.2 lb

## 2012-07-29 DIAGNOSIS — E785 Hyperlipidemia, unspecified: Secondary | ICD-10-CM

## 2012-07-29 DIAGNOSIS — E559 Vitamin D deficiency, unspecified: Secondary | ICD-10-CM

## 2012-07-29 DIAGNOSIS — I10 Essential (primary) hypertension: Secondary | ICD-10-CM

## 2012-07-29 MED ORDER — AMLODIPINE BESYLATE 10 MG PO TABS: ORAL_TABLET | ORAL | Status: DC

## 2012-07-29 NOTE — Patient Instructions (Addendum)
Please return for fasting labs on Thursday Lipid/lft-272.4 and vitamin d-vitamin d deficiency

## 2012-07-31 LAB — LIPID PANEL
Cholesterol: 160 mg/dL (ref 0–200)
LDL Cholesterol: 95 mg/dL (ref 0–99)
Total CHOL/HDL Ratio: 3.5 Ratio
VLDL: 19 mg/dL (ref 0–40)

## 2012-07-31 LAB — HEPATIC FUNCTION PANEL
ALT: 16 U/L (ref 0–35)
Bilirubin, Direct: <0.1 mg/dL (ref 0.0–0.3)
Total Protein: 6.3 g/dL (ref 6.0–8.3)

## 2012-08-01 LAB — VITAMIN D 25 HYDROXY (VIT D DEFICIENCY, FRACTURES): Vit D, 25-Hydroxy: 67 ng/mL (ref 30–89)

## 2012-08-02 ENCOUNTER — Telehealth: Payer: Self-pay | Admitting: Family Medicine

## 2012-08-02 NOTE — Telephone Encounter (Signed)
I got a written note from the patient. She was happy with care at [facility] but the distance to the clinic is too great.  [place] office [place] much closer and she'll transfer.  I typed a letter; please send it to her.  Thanks.

## 2012-08-04 NOTE — Telephone Encounter (Signed)
Printed, signed and mailed

## 2012-08-08 ENCOUNTER — Ambulatory Visit: Payer: BC Managed Care – PPO | Admitting: Family

## 2012-08-08 ENCOUNTER — Encounter: Payer: Self-pay | Admitting: *Deleted

## 2012-08-13 NOTE — Assessment & Plan Note (Signed)
Obtain vitamin d level

## 2012-08-13 NOTE — Progress Notes (Signed)
  Subjective:    Patient ID: Lori Zamora, female    DOB: 07-Jul-1956, 57 y.o.   MRN: [id number]  HPI Pt presents to clinic for followup of multiple medical problems. Pt had to change clinics due to location. Reviewed past hx and clinic notes with pt. Follows with rheumatology as well as endocrinology. Reports last a1c of 5.2. optho appt/eye exam scheduled and pending. States taking lipitor 60mg  qd and tolerates. H/o vit d deficiency and states is taking vit d 2000 u qd.   Past Medical History  Diagnosis Date  . Unspecified sleep apnea   . Acquired acanthosis nigricans   . Unspecified vitamin D deficiency   . Bipolar disorder, unspecified   . Degeneration of intervertebral disc, site unspecified   . Other thalassemia   . Other specified iron deficiency anemias   . Esophageal reflux   . Systemic lupus erythematosus   . Unspecified constipation   . Migraine with aura, without mention of intractable migraine without mention of status migrainosus   . Anemia, unspecified   . Nontoxic multinodular goiter   . Myalgia and myositis, unspecified   . Other malaise and fatigue   . Unspecified essential hypertension   . Other and unspecified hyperlipidemia   . Type II or unspecified type diabetes mellitus without mention of complication, not stated as uncontrolled   . Depressive disorder, not elsewhere classified   . 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 Sjogren's syndrome   . CFS (chronic fatigue syndrome)    Past Surgical History  Procedure Date  . Laparoscopic total hysterectomy 2004    reports that she has never smoked. She has never used smokeless tobacco. She reports that she does not drink alcohol or use illicit drugs. family history includes Alcohol abuse in an unspecified family member; Allergies in her mother; Diabetes in her mother; Heart disease in her mother; Hypertension in an unspecified family member; Kidney disease in an unspecified  family member; Obesity in her sister; and Stroke in an unspecified family member. Allergies  Allergen Reactions  . Acetazolamide   . Ezetimibe   . Latex   . Lisinopril   . Meloxicam   . Penicillins   . Pravastatin Sodium   . Simvastatin   . Sulfonamide Derivatives     REACTION: Swelling of tongue and face     Review of Systems see hpi     Objective:   Physical Exam  Nursing note and vitals reviewed. Constitutional: She appears well-developed and well-nourished. No distress.  HENT:  Head: Normocephalic and atraumatic.  Right Ear: External ear normal.  Left Ear: External ear normal.  Eyes: Conjunctivae normal are normal. No scleral icterus.  Neck: Neck supple. No thyromegaly present.  Cardiovascular: Normal rate, regular rhythm and normal heart sounds.  Exam reveals no gallop and no friction rub.   No murmur heard. Neurological: She is alert.  Skin: Skin is warm and dry. She is not diaphoretic.  Psychiatric: She has a normal mood and affect.          Assessment & Plan:

## 2012-08-13 NOTE — Assessment & Plan Note (Signed)
Obtain lipid/lft. 

## 2012-08-13 NOTE — Assessment & Plan Note (Signed)
Normotensive and stable. Continue current regimen. Monitor bp as outpt and followup in clinic as scheduled.  

## 2012-08-29 ENCOUNTER — Ambulatory Visit: Payer: BC Managed Care – PPO | Admitting: Family Medicine

## 2012-09-23 ENCOUNTER — Telehealth: Payer: Self-pay | Admitting: Internal Medicine

## 2012-09-23 MED ORDER — LANSOPRAZOLE 15 MG PO CPDR: 30.0000 mg | DELAYED_RELEASE_CAPSULE | Freq: Two times a day (BID) | ORAL | Status: DC

## 2012-09-23 MED ORDER — ATORVASTATIN CALCIUM 20 MG PO TABS: 20.0000 mg | ORAL_TABLET | Freq: Every day | ORAL | Status: DC

## 2012-09-23 NOTE — Telephone Encounter (Signed)
Refill- atorvastatin 20mg  tablets. Take 3 tablets by mouth daily. Qty 90 last fill 1.15.14

## 2012-09-23 NOTE — Telephone Encounter (Signed)
Refill-prevacid 15mg  capsules(24H) OTC. Take 2 capsules by mouth twice daily. Qty 360 last fill 2.7.14

## 2012-10-24 ENCOUNTER — Telehealth: Payer: Self-pay | Admitting: Internal Medicine

## 2012-10-24 MED ORDER — ATORVASTATIN CALCIUM 20 MG PO TABS: 60.0000 mg | ORAL_TABLET | Freq: Every day | ORAL | Status: DC

## 2012-10-24 NOTE — Telephone Encounter (Signed)
Atorvastatin 20 mg tab  Patient takes 3 per day  When her rx went the last time it called for 1 per day   [facility],  [address]

## 2012-10-24 NOTE — Telephone Encounter (Signed)
New Rx with correct Sig sent to pharmacy; pt informed/SLS

## 2012-11-27 ENCOUNTER — Ambulatory Visit: Payer: BC Managed Care – PPO | Admitting: Family Medicine

## 2012-12-31 ENCOUNTER — Telehealth: Payer: Self-pay | Admitting: Internal Medicine

## 2012-12-31 MED ORDER — ATORVASTATIN CALCIUM 20 MG PO TABS: 60.0000 mg | ORAL_TABLET | Freq: Every day | ORAL | Status: DC

## 2012-12-31 MED ORDER — AMLODIPINE BESYLATE 10 MG PO TABS: ORAL_TABLET | ORAL | Status: DC

## 2012-12-31 NOTE — Telephone Encounter (Signed)
RX sent

## 2012-12-31 NOTE — Telephone Encounter (Signed)
Refill- amlodipine besylate 10mg  tablets. Qty 30 last fill 5.18.14   Refill- atorvastatin 20mg  tablets. Qty 90 last fill 5.13.14

## 2013-01-19 ENCOUNTER — Encounter: Payer: Self-pay | Admitting: Family Medicine

## 2013-01-26 ENCOUNTER — Ambulatory Visit: Payer: BC Managed Care – PPO | Admitting: Internal Medicine

## 2013-01-26 ENCOUNTER — Telehealth: Payer: Self-pay | Admitting: Internal Medicine

## 2013-01-26 MED ORDER — ATORVASTATIN CALCIUM 20 MG PO TABS: 60.0000 mg | ORAL_TABLET | Freq: Every day | ORAL | Status: DC

## 2013-01-26 NOTE — Telephone Encounter (Signed)
RX sent

## 2013-01-26 NOTE — Telephone Encounter (Signed)
Atorvastatin 20 mg tablet  Take 3 tablets by mouth daily qty 90

## 2013-01-27 ENCOUNTER — Ambulatory Visit (INDEPENDENT_AMBULATORY_CARE_PROVIDER_SITE_OTHER): Payer: BC Managed Care – PPO | Admitting: Family Medicine

## 2013-01-27 ENCOUNTER — Encounter: Payer: Self-pay | Admitting: Family Medicine

## 2013-01-27 VITALS — BP 122/82 | HR 85 | Temp 98.1°F | Ht 65.0 in | Wt 161.0 lb

## 2013-01-27 DIAGNOSIS — R0602 Shortness of breath: Secondary | ICD-10-CM

## 2013-01-27 DIAGNOSIS — N8184 Pelvic muscle wasting: Secondary | ICD-10-CM

## 2013-01-27 DIAGNOSIS — E119 Type 2 diabetes mellitus without complications: Secondary | ICD-10-CM

## 2013-01-27 DIAGNOSIS — M359 Systemic involvement of connective tissue, unspecified: Secondary | ICD-10-CM

## 2013-01-27 DIAGNOSIS — F319 Bipolar disorder, unspecified: Secondary | ICD-10-CM

## 2013-01-27 DIAGNOSIS — D649 Anemia, unspecified: Secondary | ICD-10-CM

## 2013-01-27 DIAGNOSIS — I10 Essential (primary) hypertension: Secondary | ICD-10-CM

## 2013-01-27 DIAGNOSIS — E559 Vitamin D deficiency, unspecified: Secondary | ICD-10-CM

## 2013-01-27 DIAGNOSIS — M6289 Other specified disorders of muscle: Secondary | ICD-10-CM

## 2013-01-27 DIAGNOSIS — N289 Disorder of kidney and ureter, unspecified: Secondary | ICD-10-CM

## 2013-01-27 DIAGNOSIS — E785 Hyperlipidemia, unspecified: Secondary | ICD-10-CM

## 2013-01-27 HISTORY — DX: Other specified disorders of muscle: M62.89

## 2013-01-27 HISTORY — DX: Disorder of kidney and ureter, unspecified: N28.9

## 2013-01-27 HISTORY — DX: Systemic involvement of connective tissue, unspecified: M35.9

## 2013-01-27 LAB — LIPID PANEL
HDL: 49 mg/dL (ref >39)
LDL Cholesterol: 125 mg/dL — ABNORMAL HIGH (ref 0–99)

## 2013-01-27 LAB — CBC
MCH: 25.1 pg — ABNORMAL LOW (ref 26.0–34.0)
MCHC: 31.6 g/dL (ref 30.0–36.0)
MCV: 79.6 fL (ref 78.0–100.0)
Platelets: 474 10*3/uL — ABNORMAL HIGH (ref 150–400)
RDW: 16 % — ABNORMAL HIGH (ref 11.5–15.5)

## 2013-01-27 NOTE — Assessment & Plan Note (Signed)
Per patient so far no obvious cause has been found. Has undergone CT scan. PFTs and echo awaits follow up appt

## 2013-01-27 NOTE — Assessment & Plan Note (Signed)
Recheck level today 

## 2013-01-27 NOTE — Assessment & Plan Note (Signed)
hgba1c of 5.7 per patient

## 2013-01-27 NOTE — Assessment & Plan Note (Signed)
Well controlled no changes 

## 2013-01-27 NOTE — Patient Instructions (Signed)
Next visit annual   Vitamin D Deficiency  Not having enough vitamin D is called a deficiency. Your body needs this vitamin to keep your bones strong and healthy. Having too little of it can make your bones soft or can cause other health problems.  HOME CARE  Take all vitamins, herbs, or nutrition drinks (supplements) as told by your doctor.  Have your blood tested 2 months after taking vitamins, herbs, or nutrition drinks.  Eat foods that have vitamin D. This includes:  Dairy products, cereals, or juices with added vitamin D. Check the label.  Fatty fish like salmon or trout.  Eggs.  Oysters.  Go outside for 10 to 15 minutes when the sun is shining. Do this 3 times a week. Do not do this if you have skin cancer.  Do not use tanning beds.  Stay at a healthy weight. Lose weight if needed.  Keep all doctor visits as told. GET HELP IF:  You have questions.  You continue to have problems.  You feel sick to your stomach (nauseous) or throw up (vomit).  You cannot go poop (constipated).  You feel confused.  You have severe belly (abdominal) or back pain. MAKE SURE YOU:  Understand these instructions.  Will watch your condition.  Will get help right away if you are not doing well or get worse. Document Released: 07/19/2011 Document Revised: 10/22/2011 Document Reviewed: 07/19/2011 [facility] Patient Information 2014 [place], [place].

## 2013-01-27 NOTE — Assessment & Plan Note (Signed)
Patient has had extensive testing at [facility] and is awaiting return visit to nail down diagnosis and possible treatment. She reports some testing has been done which negates her as a candidate for biologic agents. She is encouraged to try topical Aspercreme or Salon Pas

## 2013-01-27 NOTE — Assessment & Plan Note (Signed)
Stable on Lamictal 100 mg daily

## 2013-01-27 NOTE — Assessment & Plan Note (Signed)
Undergoing Physical Therapy presently and finds that helpful

## 2013-01-27 NOTE — Progress Notes (Signed)
Patient ID: Lori Zamora, female   DOB: 08-15-1955, 57 y.o.   MRN: [id number] Lori Zamora [id number] Oct 26, 1955 01/27/2013      Progress Note-Follow Up  Subjective  Chief Complaint  Chief Complaint  Patient presents with  . Follow-up    6 month    HPI  Patient is a 57 year old [place] American female with a complicated past medical history. She follows with numerous subspecialists. She at [facility] she has a rheumatologist, dermatologist, gastroenterologist and pulmonologist. She has been diagnosed with connective tissue disorder but they're still trying to discern which type. She has chronic GI disturbances was recently treated for bacterial overgrowth. Says that has been somewhat helpful. She's also noted to have pelvic floor dysfunction is going under physical therapy for that. She follows with Dr. Marcelle Overlie of gynecology and Dr. Nolen Mu of psychiatry. She also follows with endocrinology for her thyroid disease and her diabetes. She's had some recent worsening of shortness of breath but so far workup has been unremarkable for any obvious cause. No acute illness. No fevers or chills. Does have some chest wall discomfort but no chest pressure or palpitations.  Past Medical History  Diagnosis Date  . Unspecified sleep apnea   . Acquired acanthosis nigricans   . Unspecified vitamin D deficiency   . Bipolar disorder, unspecified   . Degeneration of intervertebral disc, site unspecified   . Other thalassemia   . Other specified iron deficiency anemias   . Esophageal reflux   . Systemic lupus erythematosus   . Unspecified constipation   . Migraine with aura, without mention of intractable migraine without mention of status migrainosus   . Anemia, unspecified   . Nontoxic multinodular goiter   . Myalgia and myositis, unspecified   . Other malaise and fatigue   . Unspecified essential hypertension   . Other and unspecified hyperlipidemia   . Type II or unspecified type diabetes  mellitus without mention of complication, not stated as uncontrolled   . Depressive disorder, not elsewhere classified   . 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 Sjogren's syndrome   . CFS (chronic fatigue syndrome)     Past Surgical History  Procedure Laterality Date  . Laparoscopic total hysterectomy  2004    Family History  Problem Relation Age of Onset  . Diabetes Mother   . Obesity Sister   . Alcohol abuse      Family history of alcoholism and addiction  . Hypertension      family history  . Kidney disease      family history  . Stroke      1st degree relative <60  . Heart disease Mother     family history  . Allergies Mother     History   Social History  . Marital Status: Single    Spouse Name: N/A    Number of Children: 0  . Years of Education: N/A   Occupational History  . Teacher    Social History Main Topics  . Smoking status: Never Smoker   . Smokeless tobacco: Never Used  . Alcohol Use: No  . Drug Use: No  . Sexually Active: Not on file   Other Topics Concern  . 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 amLODipine (NORVASC) 10 MG tablet  TAKE 1 TABLET BY MOUTH DAILY  30 tablet  2  . atorvastatin (LIPITOR) 20 MG tablet Take 3 tablets (60 mg total) by mouth daily.  90 tablet  0  . cholecalciferol (VITAMIN D) 1000 UNITS tablet Take 2,000 Units by mouth daily.       Marland Kitchen DIOVAN 80 MG tablet TAKE 1 TABLET BY MOUTH DAILY  30 tablet  10  . diphenhydrAMINE (BENADRYL) 25 MG tablet Take 25 mg by mouth as needed.      Marland Kitchen glucose blood test strip 1 each by Other route daily. One Touch Ultra       . ibuprofen (ADVIL) 200 MG tablet Take 200 mg by mouth every 6 (six) hours as needed.        . lansoprazole (PREVACID) 15 MG capsule Take 2 capsules (30 mg total) by mouth 2 (two)  times daily.  360 capsule  1  . LOCOID LIPOCREAM 0.1 % CREA APPLY TO FACE AND ABDOMEN TWICE DAILY AS NEEDED  45 g  2  . Magnesium Oxide 500 MG (LAX) TABS Take by mouth daily. Take 1-4 tablets daily.      . metFORMIN (GLUCOPHAGE) 500 MG tablet Take 500 mg by mouth 2 (two) times daily with a meal.       . naphazoline (CLEAR EYES) 0.012 % ophthalmic solution Place 1 drop into both eyes 4 (four) times daily.        Marland Kitchen RA SUNSCREEN SPF50 EX Apply topically as needed.        . sitaGLIPtan (JANUVIA) 100 MG tablet Take 100 mg by mouth daily.        Marland Kitchen zolpidem (AMBIEN) 10 MG tablet TAKE ONE TABLET BY MOUTH AT BEDTIME AS NEEDED FOR SLEEP  90 tablet  1   No current facility-administered medications on file prior to visit.    Allergies  Allergen Reactions  . Acetazolamide   . Ezetimibe   . Latex   . Lisinopril   . Meloxicam   . Penicillins   . Pravastatin Sodium   . Simvastatin   . Sulfonamide Derivatives     REACTION: Swelling of tongue and face    Review of Systems  Review of Systems  Constitutional: Negative for fever and malaise/fatigue.  HENT: Negative for congestion.   Eyes: Negative for discharge.  Respiratory: Positive for shortness of breath.   Cardiovascular: Negative for chest pain, palpitations and leg swelling.  Gastrointestinal: Negative for nausea, abdominal pain and diarrhea.  Genitourinary: Negative for dysuria.  Musculoskeletal: Positive for myalgias. Negative for falls.  Skin: Negative for rash.  Neurological: Negative for loss of consciousness and headaches.  Endo/Heme/Allergies: Negative for polydipsia.  Psychiatric/Behavioral: Negative for depression and suicidal ideas. The patient is not nervous/anxious and does not have insomnia.     Objective  BP 122/82  Pulse 85  Temp(Src) 98.1 F (36.7 C) (Oral)  Ht 5\' 5"  (1.651 m)  Wt 161 lb (73.029 kg)  BMI 26.79 kg/m2  SpO2 98%  Physical Exam  Physical Exam  Constitutional: She is oriented to person, place,  and time and well-developed, well-nourished, and in no distress. No distress.  HENT:  Head: Normocephalic and atraumatic.  Eyes: Conjunctivae are normal.  Neck: Neck supple. No thyromegaly present.  Cardiovascular: Normal rate, regular rhythm and normal heart sounds.  Exam reveals no gallop.   No murmur heard. Pulmonary/Chest: Effort normal and breath sounds normal. She has no wheezes.  Abdominal: She exhibits no distension and no mass.  Musculoskeletal: She exhibits no  edema.  Lymphadenopathy:    She has no cervical adenopathy.  Neurological: She is alert and oriented to person, place, and time.  Skin: Skin is warm and dry. No rash noted. She is not diaphoretic.  Psychiatric: Memory, affect and judgment normal.    Lab Results  Component Value Date   TSH 0.95 01/18/2010   TSH 0.95 01/18/2010   Lab Results  Component Value Date   WBC 8.6 08/10/2009   HGB 12.8 08/10/2009   HCT 40.1 08/10/2009   MCV 79.6 08/10/2009   PLT 416 08/10/2009   Lab Results  Component Value Date   CREATININE 0.9 11/27/2011   BUN 12 11/27/2011   NA 140 11/27/2011   K 3.8 11/27/2011   CL 103 11/27/2011   CO2 28 11/27/2011   Lab Results  Component Value Date   ALT 16 07/31/2012   AST 13 07/31/2012   ALKPHOS 112 07/31/2012   BILITOT 0.2* 07/31/2012   Lab Results  Component Value Date   CHOL 160 07/31/2012   Lab Results  Component Value Date   HDL 46 07/31/2012   Lab Results  Component Value Date   LDLCALC 95 07/31/2012   Lab Results  Component Value Date   TRIG 93 07/31/2012   Lab Results  Component Value Date   CHOLHDL 3.5 07/31/2012     Assessment & Plan   Unspecified diffuse connective tissue disease Patient has had extensive testing at [facility] and is awaiting return visit to nail down diagnosis and possible treatment. She reports some testing has been done which negates her as a candidate for biologic agents. She is encouraged to try topical Aspercreme or Salon Pas   Renal  insufficiency Referred to nephrology at [facility] at her request for ongoing surveillance due to her multiple medical concerns  BIPOLAR DISORDER UNSPECIFIED Stable on Lamictal 100 mg daily  HYPERTENSION Well controlled no changes  DIABETES MELLITUS, TYPE II hgba1c of 5.7 per patient  VITAMIN D DEFICIENCY Recheck level today  SOB (shortness of breath) Per patient so far no obvious cause has been found. Has undergone CT scan. PFTs and echo awaits follow up appt  Pelvic floor dysfunction Undergoing Physical Therapy presently and finds that helpful

## 2013-01-27 NOTE — Assessment & Plan Note (Signed)
Referred to nephrology at [facility] at her request for ongoing surveillance due to her multiple medical concerns

## 2013-01-28 NOTE — Progress Notes (Signed)
Quick Note:  Patient Informed and voiced understanding ______ 

## 2013-01-28 NOTE — Progress Notes (Signed)
Copy of labs mailed to pt per pts request

## 2013-02-02 ENCOUNTER — Telehealth: Payer: Self-pay

## 2013-02-02 NOTE — Telephone Encounter (Signed)
Pt informed and voiced understanding

## 2013-02-02 NOTE — Telephone Encounter (Signed)
Pt left a message stating she was supposed to get a CBC w/differential and only the CBC was done? Pt would like to know when she can do this?  Verbal per MD that the differential is usually if looking for infection and pt was only coming in for routine labs.   I left a message for pt to return my call

## 2013-02-04 ENCOUNTER — Encounter: Payer: Self-pay | Admitting: Family Medicine

## 2013-02-05 ENCOUNTER — Ambulatory Visit: Payer: BC Managed Care – PPO | Admitting: Physical Therapy

## 2013-02-26 ENCOUNTER — Other Ambulatory Visit: Payer: Self-pay | Admitting: Family Medicine

## 2013-03-03 ENCOUNTER — Other Ambulatory Visit: Payer: Self-pay

## 2013-03-03 DIAGNOSIS — Z1231 Encounter for screening mammogram for malignant neoplasm of breast: Secondary | ICD-10-CM

## 2013-03-23 ENCOUNTER — Other Ambulatory Visit: Payer: Self-pay | Admitting: *Deleted

## 2013-03-23 MED ORDER — ATORVASTATIN CALCIUM 20 MG PO TABS: 60.0000 mg | ORAL_TABLET | Freq: Every day | ORAL | Status: DC

## 2013-03-23 NOTE — Telephone Encounter (Signed)
Rx request to pharmacy/[facility]  

## 2013-03-24 ENCOUNTER — Ambulatory Visit (INDEPENDENT_AMBULATORY_CARE_PROVIDER_SITE_OTHER): Payer: BC Managed Care – PPO | Admitting: Family Medicine

## 2013-03-24 ENCOUNTER — Encounter: Payer: Self-pay | Admitting: Family Medicine

## 2013-03-24 VITALS — BP 110/68 | HR 87 | Temp 98.4°F | Ht 65.0 in | Wt 164.0 lb

## 2013-03-24 DIAGNOSIS — M359 Systemic involvement of connective tissue, unspecified: Secondary | ICD-10-CM

## 2013-03-24 DIAGNOSIS — I10 Essential (primary) hypertension: Secondary | ICD-10-CM

## 2013-03-24 DIAGNOSIS — M6281 Muscle weakness (generalized): Secondary | ICD-10-CM

## 2013-03-24 NOTE — Progress Notes (Signed)
Patient ID: Lori Zamora, female   DOB: 06-Oct-1955, 57 y.o.   MRN: [id number] THANDIWE SIRAGUSA [id number] March 16, 1956 03/24/2013      Progress Note-Follow Up  Subjective  Chief Complaint  Chief Complaint  Patient presents with  . muscle problem    HPI  Patient is a 57 year old African American female who is in today complaining of muscle twitches or spasms. She says it has been occurring for roughly 4 months. Happens multiple times each day and occurs randomly in all muscle groups. It can happen in her arms, legs, face or trunk. There is no pain associated with it she will watch the muscle visibly draw up and then relax. Typically last seconds and then relieved. So far she's tried to address this with dermatology with the results. She reports in past having high CPKs but more recently normal. Lab work done and rheumatology, endocrinology multiple sites and they've not found any abnormalities in her renal function, thyroid function or electrolytes recently. Denies palpitations but does have chest wall tenderness at times. Has been doing physical therapy at [facility] physical therapy for some proximal muscle weakness. This is longer term but is persistent. She notes the weakness is present in the arms and legs although they were only working on the legs at the moment. She denies any recent illness or fevers. Denies any falls or signs of acute infection  Past Medical History  Diagnosis Date  . Unspecified sleep apnea   . Acquired acanthosis nigricans   . Unspecified vitamin D deficiency   . Bipolar disorder, unspecified   . Degeneration of intervertebral disc, site unspecified   . Other thalassemia   . Other specified iron deficiency anemias   . Esophageal reflux   . Systemic lupus erythematosus   . Unspecified constipation   . Migraine with aura, without mention of intractable migraine without mention of status migrainosus   . Anemia, unspecified   . Nontoxic multinodular goiter   .  Myalgia and myositis, unspecified   . Other malaise and fatigue   . Unspecified essential hypertension   . Other and unspecified hyperlipidemia   . Type II or unspecified type diabetes mellitus without mention of complication, not stated as uncontrolled   . Depressive disorder, not elsewhere classified   . 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 Sjogren's syndrome   . CFS (chronic fatigue syndrome)   . 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 Renal insufficiency 01/27/2013  . Pelvic floor dysfunction 01/27/2013    Past Surgical History  Procedure Laterality Date  . Laparoscopic total hysterectomy  2004    Family History  Problem Relation Age of Onset  . Diabetes Mother   . Obesity Sister   . Alcohol abuse      Family history of alcoholism and addiction  . Hypertension      family history  . Kidney disease      family history  . Stroke      1st degree relative <60  . Heart disease Mother     family history  . Allergies Mother     History   Social History  . Marital Status: Single    Spouse Name: N/A    Number of Children: 0  . Years of Education: N/A   Occupational History  .  Teacher    Social History Main Topics  . Smoking status: Never Smoker   . Smokeless tobacco: Never Used  . Alcohol Use: No  . Drug Use: No  . Sexually Active: Not on file   Other Topics Concern  . 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 amLODipine (NORVASC) 10 MG tablet TAKE 1 TABLET BY MOUTH DAILY  30 tablet  2  . atorvastatin (LIPITOR) 20 MG tablet Take 3 tablets (60 mg total) by mouth daily.  90 tablet  1  .  cholecalciferol (VITAMIN D) 1000 UNITS tablet Take 2,000 Units by mouth daily.       Marland Kitchen desonide (DESONATE) 0.05 % gel       . diphenhydrAMINE (BENADRYL) 25 MG tablet Take 25 mg by mouth as needed.      Marland Kitchen glucose blood test strip 1 each by Other route daily. One Touch Ultra       . ibuprofen (ADVIL) 200 MG tablet Take 200 mg by mouth every 6 (six) hours as needed.        Marland Kitchen ketoconazole (NIZORAL) 2 % cream Apply topically as directed.      . lamoTRIgine (LAMICTAL) 100 MG tablet Take 100 mg by mouth daily.      . lansoprazole (PREVACID) 15 MG capsule Take 2 capsules (30 mg total) by mouth 2 (two) times daily.  360 capsule  1  . Magnesium Oxide 500 MG (LAX) TABS Take by mouth daily. Take 1-4 tablets daily.      . metFORMIN (GLUCOPHAGE) 500 MG tablet Take 500 mg by mouth 2 (two) times daily with a meal.       . naphazoline (CLEAR EYES) 0.012 % ophthalmic solution Place 1 drop into both eyes 4 (four) times daily.        Marland Kitchen OVER THE COUNTER MEDICATION [facility]      . RA SUNSCREEN SPF50 EX Apply topically as needed.        . sitaGLIPtan (JANUVIA) 100 MG tablet Take 100 mg by mouth daily.        . valsartan (DIOVAN) 80 MG tablet Take 1 by mouth daily  30 tablet  0  . zolpidem (AMBIEN) 10 MG tablet TAKE ONE TABLET BY MOUTH AT BEDTIME AS NEEDED FOR SLEEP  90 tablet  1   No current facility-administered medications on file prior to visit.    Allergies  Allergen Reactions  . Acetazolamide   . Ezetimibe   . Latex   . Lisinopril   . Meloxicam   . Penicillins   . Pravastatin Sodium   . Simvastatin   . Sulfonamide Derivatives     REACTION: Swelling of tongue and face    Review of Systems  Review of Systems  Constitutional: Negative for fever and malaise/fatigue.  HENT: Negative for congestion.   Eyes: Negative for pain and discharge.  Respiratory: Negative for shortness of breath.   Cardiovascular: Negative for chest pain, palpitations and leg swelling.  Gastrointestinal: Negative for  nausea, abdominal pain and diarrhea.  Genitourinary: Negative for dysuria.  Musculoskeletal: Positive for myalgias. Negative for falls.       Muscle spasms fleeting and diffuse numerous times a day in arms, legs, face, trunk x 4 months  Skin: Negative for rash.  Neurological: Positive for focal weakness and weakness. Negative for loss  of consciousness and headaches.       Reports proximal weakness in arms and legs b/l x years   Endo/Heme/Allergies: Negative for polydipsia.  Psychiatric/Behavioral: Negative for depression and suicidal ideas. The patient is not nervous/anxious and does not have insomnia.     Objective  BP 110/68  Pulse 87  Temp(Src) 98.4 F (36.9 C) (Oral)  Ht 5\' 5"  (1.651 m)  Wt 164 lb (74.39 kg)  BMI 27.29 kg/m2  SpO2 96%  Physical Exam  Physical Exam  Constitutional: She is oriented to person, place, and time and well-developed, well-nourished, and in no distress. No distress.  HENT:  Head: Normocephalic and atraumatic.  Eyes: Conjunctivae are normal.  Neck: Neck supple. No thyromegaly present.  Cardiovascular: Normal rate, regular rhythm and normal heart sounds.   No murmur heard. Pulmonary/Chest: Effort normal and breath sounds normal. She has no wheezes.  Abdominal: She exhibits no distension.  Musculoskeletal: She exhibits no edema and no tenderness.  Lymphadenopathy:    She has no cervical adenopathy.  Neurological: She is alert and oriented to person, place, and time. She has normal reflexes. No cranial nerve deficit. Gait normal. Coordination normal. GCS score is 15.  Skin: Skin is warm and dry. No rash noted. She is not diaphoretic.  Psychiatric: Memory, affect and judgment normal.    Lab Results  Component Value Date   TSH 0.95 01/18/2010   TSH 0.95 01/18/2010   Lab Results  Component Value Date   WBC 7.9 01/27/2013   HGB 12.7 01/27/2013   HCT 40.2 01/27/2013   MCV 79.6 01/27/2013   PLT 474* 01/27/2013   Lab Results  Component Value Date    CREATININE 0.9 11/27/2011   BUN 12 11/27/2011   NA 140 11/27/2011   K 3.8 11/27/2011   CL 103 11/27/2011   CO2 28 11/27/2011   Lab Results  Component Value Date   ALT 16 07/31/2012   AST 13 07/31/2012   ALKPHOS 112 07/31/2012   BILITOT 0.2* 07/31/2012   Lab Results  Component Value Date   CHOL 200 01/27/2013   Lab Results  Component Value Date   HDL 49 01/27/2013   Lab Results  Component Value Date   LDLCALC 125* 01/27/2013   Lab Results  Component Value Date   TRIG 131 01/27/2013   Lab Results  Component Value Date   CHOLHDL 4.1 01/27/2013     Assessment & Plan  Unspecified diffuse connective tissue disease Is in today c/o roughly 4 months of worsening muscle spasms diffusely. She denies any pain but notes multiple each day she will notice her muscles draw up and down and relax. He can occur and her arms, her legs but also in her chest wall or in her face. She notes having significant blood work done elsewhere in the last 6 months. She reports having her thyroid studies, renal studies as well his calcium checked. Reports normal. Has had CPK done in the past. Denies ever having ionized calcium checked. Review of her chart reveals calcium to be normal but high normal. Will check an ionized calcium. He is offered a neurologic consultation but declines for now. We'll consider ionized calcium is normal. Is going to try and switch her rheumatologist within the [facility] program and we will consider further referral as needed.  HYPERTENSION Well controlled, no changes to meds today

## 2013-03-24 NOTE — Patient Instructions (Addendum)
Muscle Cramps  Muscle cramps are due to sudden involuntary muscle contraction. This means you have no control over the tightening of a muscle (or muscles). Often there are no obvious causes. Muscle cramps may occur with overexertion. They may also occur with chilling of the muscles. An example of a muscle chilling activity is swimming. It is uncommon for cramps to be due to a serious underlying disorder. In most cases, muscle cramps improve (or leave) within minutes.  CAUSES   Some common causes are:   Injury.   Infections, especially viral.   Abnormal levels of the salts and ions in your blood (electrolytes). This could happen if you are taking water pills (diuretics).   Blood vessel disease where not enough blood is getting to the muscles (intermittent claudication).  Some uncommon causes are:   Side effects of some medicine (such as lithium).   Alcohol abuse.   Diseases where there is soreness (inflammation) of the muscular system.  HOME CARE INSTRUCTIONS    It may be helpful to massage, stretch, and relax the affected muscle.   Taking a dose of over-the-counter diphenhydramine is helpful for night leg cramps.  SEEK MEDICAL CARE IF:   Cramps are frequent and not relieved with medicine.  MAKE SURE YOU:    Understand these instructions.   Will watch your condition.   Will get help right away if you are not doing well or get worse.  Document Released: 01/19/2002 Document Revised: 10/22/2011 Document Reviewed: 07/21/2008  [employer] Patient Information 2014 [employer]

## 2013-03-24 NOTE — Assessment & Plan Note (Signed)
Well controlled, no changes to meds today 

## 2013-03-24 NOTE — Assessment & Plan Note (Signed)
Is in today c/o roughly 4 months of worsening muscle spasms diffusely. She denies any pain but notes multiple each day she will notice her muscles draw up and down and relax. He can occur and her arms, her legs but also in her chest wall or in her face. She notes having significant blood work done elsewhere in the last 6 months. She reports having her thyroid studies, renal studies as well his calcium checked. Reports normal. Has had CPK done in the past. Denies ever having ionized calcium checked. Review of her chart reveals calcium to be normal but high normal. Will check an ionized calcium. He is offered a neurologic consultation but declines for now. We'll consider ionized calcium is normal. Is going to try and switch her rheumatologist within the [facility] program and we will consider further referral as needed.

## 2013-03-25 ENCOUNTER — Telehealth: Payer: Self-pay

## 2013-03-25 ENCOUNTER — Other Ambulatory Visit: Payer: Self-pay | Admitting: Family Medicine

## 2013-03-25 DIAGNOSIS — IMO0001 Reserved for inherently not codable concepts without codable children: Secondary | ICD-10-CM

## 2013-03-25 LAB — CALCIUM, IONIZED: Calcium, Ion: 1.3 mmol/L (ref 1.12–1.32)

## 2013-03-25 MED ORDER — VALSARTAN 80 MG PO TABS: 80.0000 mg | ORAL_TABLET | Freq: Every day | ORAL | Status: DC

## 2013-03-25 NOTE — Telephone Encounter (Signed)
Sure for myositis

## 2013-03-25 NOTE — Telephone Encounter (Signed)
Lab order placed.

## 2013-03-25 NOTE — Telephone Encounter (Signed)
Pt came in today for labs and asked if she can have her CPK checked?  Please advise if Katrina needs to add this and if so what diagnosis?

## 2013-03-31 ENCOUNTER — Ambulatory Visit
Admission: RE | Admit: 2013-03-31 | Discharge: 2013-03-31 | Disposition: A | Discharge status: Home / Self Care | Payer: BC Managed Care – PPO | Source: Ambulatory Visit

## 2013-03-31 DIAGNOSIS — Z1231 Encounter for screening mammogram for malignant neoplasm of breast: Secondary | ICD-10-CM

## 2013-04-03 LAB — CK ISOENZYMES
CK-BB: 0 %
CK-MB: 0 % (ref <5)
Total CK: 59 U/L (ref 29–143)

## 2013-04-26 ENCOUNTER — Other Ambulatory Visit: Payer: Self-pay | Admitting: Family Medicine

## 2013-04-27 ENCOUNTER — Other Ambulatory Visit: Payer: Self-pay | Admitting: *Deleted

## 2013-04-27 MED ORDER — LANSOPRAZOLE 15 MG PO CPDR: 30.0000 mg | DELAYED_RELEASE_CAPSULE | Freq: Two times a day (BID) | ORAL | Status: DC

## 2013-04-27 NOTE — Telephone Encounter (Signed)
Rx request to pharmacy/[facility]  

## 2013-05-01 ENCOUNTER — Telehealth: Payer: Self-pay

## 2013-05-01 NOTE — Telephone Encounter (Signed)
OK to check Ammonia level, she needs to start hep A and B shots. A is 2 shots over 6 mnths, B is 3 shots over 6 months, she can have a nurse visit to start these. Ammonia level for myalgias, muscle spasm. Myositis. Warn about payable diagnosis. Schedule her an appt in a month with me,

## 2013-05-01 NOTE — Telephone Encounter (Signed)
Patient states that she would like to have ammonia levels checked? Pt stated she knows she has to fast and she hasn't ate yet this morning? Please advise?  Pt also stated she faxed over some information for MD (I believe its on md's desk)

## 2013-05-01 NOTE — Telephone Encounter (Signed)
Message copied by Court Joy on Fri May 01, 2013  3:50 PM ------      Message from: Dedra Skeens      Created: Fri May 01, 2013  2:11 PM       Patient says to disregard the message that she left on your vm. She scheduled an appointment for Monday morning. ------

## 2013-05-01 NOTE — Telephone Encounter (Signed)
FYI

## 2013-05-04 ENCOUNTER — Other Ambulatory Visit: Payer: Self-pay | Admitting: Physician Assistant

## 2013-05-04 ENCOUNTER — Ambulatory Visit (INDEPENDENT_AMBULATORY_CARE_PROVIDER_SITE_OTHER): Payer: BC Managed Care – PPO | Admitting: Physician Assistant

## 2013-05-04 ENCOUNTER — Encounter: Payer: Self-pay | Admitting: Physician Assistant

## 2013-05-04 VITALS — BP 112/78 | HR 100 | Temp 98.8°F | Resp 16 | Ht 65.25 in | Wt 167.1 lb

## 2013-05-04 DIAGNOSIS — Z23 Encounter for immunization: Secondary | ICD-10-CM | POA: Insufficient documentation

## 2013-05-04 DIAGNOSIS — R748 Abnormal levels of other serum enzymes: Secondary | ICD-10-CM

## 2013-05-04 NOTE — Assessment & Plan Note (Signed)
Patient following up with her GI specialist at [facility].  Will proceed with obtaining plasma ammonia level due to history and patient request.  Will call with results.

## 2013-05-04 NOTE — Assessment & Plan Note (Signed)
Patient requests to get vaccination after visit to lab.  Discussed with patient that it would be better to give immunization before lab due to lack of rooms, and that she might have to wait.  Patient refuses and says she will schedule another appointment for vaccination.  Patient educated on importance of vaccination given medical history.

## 2013-05-04 NOTE — Progress Notes (Signed)
Patient ID: Lori Zamora, female   DOB: 12/12/1955, 57 y.o.   MRN: [id number]  Patient presents to clinic today requesting testing for her ammonia levels.  Patient states over the past few weeks she has notices an ammonia smell to her breath and skin.  Denies history of elevated ammonia levels.  Does endorses history of biliary disease and has started seeing specialists at [facility] for further workup.  Brings lab results from specialists to visit.  Has positive anti-mitochondrial antibodies and elevated alk phos levels.  Endorses brain fog but attributes that to her fibromyalgia and connective tissue disease.  Denies change in level of alertness, syncope, or loss of function.  Has had some nausea but no abdominal pain that the GI specialist is looking into.    Patient also states that her GI doctor recommended that she get the Hep A and Hep B vaccinations due to other lab results.  Patient states that she has had the Hep B vaccines in the past because she works as a Runner, broadcasting/film/video, but her results show no evidence of immunity.  After pulling her [place] immunization records, there is no indication that she has ever received a hepatitis B vaccination, although she says that is not possible.  Past Medical History  Diagnosis Date  . Unspecified sleep apnea   . Acquired acanthosis nigricans   . Unspecified vitamin D deficiency   . Bipolar disorder, unspecified   . Degeneration of intervertebral disc, site unspecified   . Other thalassemia   . Other specified iron deficiency anemias   . Esophageal reflux   . Systemic lupus erythematosus   . Unspecified constipation   . Migraine with aura, without mention of intractable migraine without mention of status migrainosus   . Anemia, unspecified   . Nontoxic multinodular goiter   . Myalgia and myositis, unspecified   . Other malaise and fatigue   . Unspecified essential hypertension   . Other and unspecified hyperlipidemia   . Type II or unspecified type diabetes  mellitus without mention of complication, not stated as uncontrolled   . Depressive disorder, not elsewhere classified   . 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 Sjogren's syndrome   . CFS (chronic fatigue syndrome)   . Unspecified diffuse connective tissue disease 01/27/2013    Follows at [facility] rheumatology, Dr Guss Bunde Per patient has tested 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 antibody  . Renal insufficiency 01/27/2013  . Pelvic floor dysfunction 01/27/2013    Current Outpatient Prescriptions on File Prior to Visit  Medication Sig Dispense Refill  . amLODipine (NORVASC) 10 MG tablet TAKE 1 TABLET BY MOUTH EVERY DAY  30 tablet  0  . atorvastatin (LIPITOR) 20 MG tablet Take 3 tablets (60 mg total) by mouth daily.  90 tablet  1  . cholecalciferol (VITAMIN D) 1000 UNITS tablet Take 2,000 Units by mouth daily.       Marland Kitchen desonide (DESONATE) 0.05 % gel       . diphenhydrAMINE (BENADRYL) 25 MG tablet Take 25 mg by mouth as needed.      Marland Kitchen glucose blood test strip 1 each by Other route daily. One Touch Ultra       . ibuprofen (ADVIL) 200 MG tablet Take 200 mg by mouth every 6 (six) hours as needed.        Marland Kitchen ketoconazole (NIZORAL) 2 % cream Apply topically as directed.      Marland Kitchen  lamoTRIgine (LAMICTAL) 100 MG tablet Take 100 mg by mouth daily.      . lansoprazole (PREVACID) 15 MG capsule Take 2 capsules (30 mg total) by mouth 2 (two) times daily.  360 capsule  1  . Lidocaine, Anorectal, 5 % CREA Apply topically as needed.      . Magnesium Oxide 500 MG (LAX) TABS Take by mouth daily. Take 1-4 tablets daily.      . metFORMIN (GLUCOPHAGE) 500 MG tablet Take 500 mg by mouth 2 (two) times daily with a meal.       . naphazoline (CLEAR EYES) 0.012 % ophthalmic solution Place 1 drop into both eyes 4 (four) times daily.        Marland Kitchen OVER THE COUNTER MEDICATION [facility]      . Probiotic Product (ALIGN) 4 MG CAPS Take 1 capsule by mouth  daily.      Marland Kitchen RA SUNSCREEN SPF50 EX Apply topically as needed.        . sitaGLIPtan (JANUVIA) 100 MG tablet Take 100 mg by mouth daily.        . valsartan (DIOVAN) 80 MG tablet Take 1 tablet (80 mg total) by mouth daily.  30 tablet  5  . zolpidem (AMBIEN) 10 MG tablet TAKE ONE TABLET BY MOUTH AT BEDTIME AS NEEDED FOR SLEEP  90 tablet  1   No current facility-administered medications on file prior to visit.    Allergies  Allergen Reactions  . Acetazolamide   . Ezetimibe   . Latex   . Lisinopril   . Meloxicam   . Penicillins   . Pravastatin Sodium   . Simvastatin   . Sulfonamide Derivatives     REACTION: Swelling of tongue and face    Family History  Problem Relation Age of Onset  . Diabetes Mother   . Obesity Sister   . Alcohol abuse      Family history of alcoholism and addiction  . Hypertension      family history  . Kidney disease      family history  . Stroke      1st degree relative <60  . Heart disease Mother     family history  . Allergies Mother     History   Social History  . Marital Status: Single    Spouse Name: N/A    Number of Children: 0  . Years of Education: N/A   Occupational History  . Teacher    Social History Main Topics  . Smoking status: Never Smoker   . Smokeless tobacco: Never Used  . Alcohol Use: No  . Drug Use: No  . Sexual Activity: Not on file   Other Topics Concern  . 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  Gastrointestinal: Positive for nausea. Negative for vomiting and abdominal pain.  Neurological: Negative for sensory change, speech change, focal weakness and loss of consciousness.    Physical Exam  Vitals reviewed. Constitutional: She is oriented to person, place, and time and well-developed, well-nourished, and in no distress.  HENT:  Head: Normocephalic  and atraumatic.  Eyes: Conjunctivae and EOM are normal.  Neck: Neck supple.  Cardiovascular: Normal rate, regular rhythm and normal heart sounds.   Abdominal: Soft. Bowel sounds are normal. There is no tenderness.  Neurological: She is alert and oriented to person, place,  and time.  Skin: Skin is warm and dry. No rash noted.  Psychiatric: Affect normal.   Recent Results (from the past 2160 hour(s))  CALCIUM, IONIZED     Status: None   Collection Time    03/25/13  9:00 AM      Result Value Range   Calcium, Ion 1.30  1.12 - 1.32 mmol/L    Assessment/Plan: Elevated alkaline phosphatase level Patient following up with her GI specialist at [facility].  Will proceed with obtaining plasma ammonia level due to history and patient request.  Will call with results.    Need for immunization against viral hepatitis Patient requests to get vaccination after visit to lab.  Discussed with patient that it would be better to give immunization before lab due to lack of rooms, and that she might have to wait.  Patient refuses and says she will schedule another appointment for vaccination.  Patient educated on importance of vaccination given medical history.

## 2013-05-07 ENCOUNTER — Telehealth: Payer: Self-pay | Admitting: Family Medicine

## 2013-05-07 NOTE — Telephone Encounter (Signed)
Updated 09.29.14 injection note: Twinrix [Hep A/B]/SLS

## 2013-05-07 NOTE — Telephone Encounter (Signed)
Patient states that she was just seen by [facility] and needed to schedule hep shots. I scheduled her for Monday morning but wasn't sure for which Hep shot?

## 2013-05-11 ENCOUNTER — Ambulatory Visit (INDEPENDENT_AMBULATORY_CARE_PROVIDER_SITE_OTHER): Payer: BC Managed Care – PPO

## 2013-05-11 DIAGNOSIS — Z23 Encounter for immunization: Secondary | ICD-10-CM

## 2013-05-11 NOTE — Progress Notes (Signed)
  Subjective:    Patient ID: Lori Zamora, female    DOB: [birth year], 57 y.o.   MRN: [id number]  HPI    Review of Systems     Objective:   Physical Exam        Assessment & Plan:  Patient came in for her twinrix vaccination. Pt tolerated injection well. Pt was also informed to return Oct 29, 14 and October 24, 2013

## 2013-05-14 LAB — PULMONARY FUNCTION TEST

## 2013-05-18 ENCOUNTER — Ambulatory Visit (INDEPENDENT_AMBULATORY_CARE_PROVIDER_SITE_OTHER): Payer: BC Managed Care – PPO

## 2013-05-18 DIAGNOSIS — Z23 Encounter for immunization: Secondary | ICD-10-CM

## 2013-05-20 ENCOUNTER — Other Ambulatory Visit: Payer: Self-pay | Admitting: Family Medicine

## 2013-05-23 ENCOUNTER — Other Ambulatory Visit: Payer: Self-pay | Admitting: Family Medicine

## 2013-05-24 ENCOUNTER — Other Ambulatory Visit: Payer: Self-pay | Admitting: Family Medicine

## 2013-05-25 NOTE — Telephone Encounter (Signed)
Rx request to pharmacy/[facility]  

## 2013-05-28 ENCOUNTER — Other Ambulatory Visit: Payer: Self-pay | Admitting: Internal Medicine

## 2013-05-28 DIAGNOSIS — E042 Nontoxic multinodular goiter: Secondary | ICD-10-CM

## 2013-06-01 ENCOUNTER — Ambulatory Visit
Admission: RE | Admit: 2013-06-01 | Discharge: 2013-06-01 | Disposition: A | Discharge status: Home / Self Care | Payer: BC Managed Care – PPO | Source: Ambulatory Visit | Attending: Internal Medicine | Admitting: Internal Medicine

## 2013-06-01 DIAGNOSIS — E042 Nontoxic multinodular goiter: Secondary | ICD-10-CM

## 2013-06-04 ENCOUNTER — Encounter: Payer: Self-pay | Admitting: Cardiology

## 2013-06-04 ENCOUNTER — Ambulatory Visit (INDEPENDENT_AMBULATORY_CARE_PROVIDER_SITE_OTHER): Payer: BC Managed Care – PPO | Admitting: Cardiology

## 2013-06-04 VITALS — BP 120/68 | HR 81 | Ht 65.25 in | Wt 163.0 lb

## 2013-06-04 DIAGNOSIS — H539 Unspecified visual disturbance: Secondary | ICD-10-CM

## 2013-06-04 DIAGNOSIS — I10 Essential (primary) hypertension: Secondary | ICD-10-CM

## 2013-06-04 MED ORDER — HYDROCHLOROTHIAZIDE 25 MG PO TABS: 25.0000 mg | ORAL_TABLET | Freq: Every day | ORAL | Status: DC

## 2013-06-04 MED ORDER — ASPIRIN EC 81 MG PO TBEC: 81.0000 mg | DELAYED_RELEASE_TABLET | Freq: Every day | ORAL | Status: DC

## 2013-06-04 NOTE — Progress Notes (Signed)
Patient ID: ZULEYKA KLOC, female   DOB: 12-24-1955, 57 y.o.   MRN: [id number]    Patient Name: Lori Zamora Date of Encounter: 06/04/2013  Primary Care Provider:  Danise Edge, MD Primary Cardiologist:  Tobias Alexander, H   Patient Profile  Abnormal echocardiogram  Problem List   Past Medical History  Diagnosis Date  . Unspecified sleep apnea   . Acquired acanthosis nigricans   . Unspecified vitamin D deficiency   . Bipolar disorder, unspecified   . Degeneration of intervertebral disc, site unspecified   . Other thalassemia   . Other specified iron deficiency anemias   . Esophageal reflux   . Systemic lupus erythematosus   . Unspecified constipation   . Migraine with aura, without mention of intractable migraine without mention of status migrainosus   . Anemia, unspecified   . Nontoxic multinodular goiter   . Myalgia and myositis, unspecified   . Other malaise and fatigue   . Unspecified essential hypertension   . Other and unspecified hyperlipidemia   . Type II or unspecified type diabetes mellitus without mention of complication, not stated as uncontrolled   . Depressive disorder, not elsewhere classified   . 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 Sjogren's syndrome   . CFS (chronic fatigue syndrome)   . Unspecified diffuse connective tissue disease 01/27/2013    Follows at [facility] rheumatology, Dr Guss Bunde Per patient has tested 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 antibody  . Renal insufficiency 01/27/2013  . Pelvic floor dysfunction 01/27/2013   Past Surgical History  Procedure Laterality Date  . Laparoscopic total hysterectomy  2004    Allergies  Allergies  Allergen Reactions  . Acetazolamide   . Azathioprine   . Ezetimibe   . Latex   . Lisinopril   . Meloxicam   . Penicillins   . Pravastatin Sodium   . Simvastatin   . Sulfonamide Derivatives     REACTION: Swelling  of tongue and face    HPI  57 year old female with prior medical history of bipolar disorder, sleep apnea, systemic lupus erythematosus,  hypertension, hyperlipidemia who is self-referred and is coming for a finding of abnormal echocardiogram. Her echocardiogram shows normal left and right ventricular size and function, grade 1 diastolic dysfunction, trivial mitral and tricuspid regurgitation, aneurysmal intra-atrial septum and positive bubble study consistent with persistent PFO. She also had a chest CT in January of 2013 that was done to evaluate for pulmonary fibrosis but it was excluded. She had COPD evaluated that was within normal range. She had a normal pulmonary function test in February of 2014. The patient underwent an exercise nuclear stress test in 2005 were read with fracture T12 moderate post intubation and was read as possible small area of mild reversibility in the anteroseptal wall. The patient states that she has mild lower extremity edema, mild exertional shortness of breath, no chest pain, she also denies palpitations or syncope. The patient states that she had 2 occasions of impaired vision, Eventually improved, one of them lasting for couple months.   Home Medications  Prior to Admission medications   Medication Sig Start Date End Date Taking? Authorizing Provider  amLODipine (NORVASC) 10 MG tablet TAKE 1 TABLET BY MOUTH EVERY DAY 05/23/13  Yes Bradd Canary, MD  atorvastatin (LIPITOR) 20 MG tablet TAKE 3 TABLETS BY MOUTH EVERY DAY 05/20/13  Yes Bradd Canary, MD  cholecalciferol (VITAMIN D) 1000 UNITS  tablet Take 2,000 Units by mouth daily.    Yes Historical Provider, MD  DESONATE 0.05 % gel APPLY TOPICALLY TWICE DAILY 05/24/13  Yes Bradd Canary, MD  diphenhydrAMINE (BENADRYL) 25 MG tablet Take 25 mg by mouth as needed.   Yes Historical Provider, MD  glucose blood test strip 1 each by Other route daily. One Touch Ultra    Yes Historical Provider, MD  ibuprofen (ADVIL) 200  MG tablet Take 200 mg by mouth every 6 (six) hours as needed.     Yes Historical Provider, MD  ketoconazole (NIZORAL) 2 % cream Apply topically as directed. 08/27/11  Yes Joaquim Nam, MD  lamoTRIgine (LAMICTAL) 100 MG tablet Take 100 mg by mouth daily.   Yes Historical Provider, MD  lansoprazole (PREVACID) 15 MG capsule Take 2 capsules (30 mg total) by mouth 2 (two) times daily. 04/27/13  Yes Bradd Canary, MD  Lidocaine, Anorectal, 5 % CREA Apply topically as needed.   Yes Historical Provider, MD  Magnesium Oxide 500 MG (LAX) TABS Take by mouth daily. Take 1-4 tablets daily.   Yes Historical Provider, MD  metFORMIN (GLUCOPHAGE) 500 MG tablet Take 500 mg by mouth 2 (two) times daily with a meal.    Yes Historical Provider, MD  naphazoline (CLEAR EYES) 0.012 % ophthalmic solution Place 1 drop into both eyes 4 (four) times daily.     Yes Historical Provider, MD  OVER THE COUNTER MEDICATION [facility]   Yes Historical Provider, MD  Probiotic Product (ALIGN) 4 MG CAPS Take 1 capsule by mouth daily.   Yes Historical Provider, MD  RA SUNSCREEN SPF50 EX Apply topically as needed.     Yes Historical Provider, MD  sitaGLIPtan (JANUVIA) 100 MG tablet Take 100 mg by mouth daily.     Yes Historical Provider, MD  valsartan (DIOVAN) 80 MG tablet Take 1 tablet (80 mg total) by mouth daily. 03/25/13  Yes Bradd Canary, MD  zolpidem (AMBIEN) 10 MG tablet TAKE ONE TABLET BY MOUTH AT BEDTIME AS NEEDED FOR SLEEP 12/27/11  Yes Joaquim Nam, MD    Family History  Family History  Problem Relation Age of Onset  . Diabetes Mother   . Obesity Sister   . Alcohol abuse      Family history of alcoholism and addiction  . Hypertension      family history  . Kidney disease      family history  . Stroke      1st degree relative <60  . Heart disease Mother     family history  . Allergies Mother     Social History  History   Social History  . Marital Status: Single    Spouse Name: N/A    Number of  Children: 0  . Years of Education: N/A   Occupational History  . Teacher    Social History Main Topics  . Smoking status: Never Smoker   . Smokeless tobacco: Never Used  . Alcohol Use: No  . Drug Use: No  . Sexual Activity: Not on file   Other Topics Concern  . 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  Cardiovascular:  No chest pain, dyspnea on exertion, edema, orthopnea, palpitations, paroxysmal nocturnal dyspnea. Dermatological: No rash, lesions/masses Respiratory: No cough, dyspnea Urologic: No hematuria, dysuria Abdominal:  No nausea, vomiting, diarrhea, bright red blood per rectum, melena, or hematemesis Neurologic:  No visual changes, wkns, changes in mental status. All other systems reviewed and are otherwise negative except as noted above.  Physical Exam  Height 5' 5.25" (1.657 m), weight 163 lb (73.936 kg).  General: Pleasant, NAD Psych: Normal affect. Neuro: Alert and oriented X 3. Moves all extremities spontaneously. HEENT: Normal  Neck: Supple without bruits or JVD. Lungs:  Resp regular and unlabored, CTA. Heart: RRR no s3, s4, or murmurs. Abdomen: Soft, non-tender, non-distended, BS + x 4.  Extremities: No clubbing, cyanosis or edema. DP/PT/Radials 2+ and equal bilaterally.  Lipid Panel     Component Value Date/Time   CHOL 200 01/27/2013 1145   TRIG 131 01/27/2013 1145   HDL 49 01/27/2013 1145   CHOLHDL 4.1 01/27/2013 1145   VLDL 26 01/27/2013 1145   LDLCALC 125* 01/27/2013 1145   Accessory Clinical Findings  ECG - normal sinus rhythm 81 beats per minute  Assessment & Plan  57 year old female with connective tissue disorder hypertension hyperlipidemia loose admitting for consultation abdominal her prior abnormal echocardiogram. Discussed in length of her findings on her echocardiogram, first dilated aortic root  and ascending aorta in fact qualifies for normal diameter. She was explained the significance of persistent PFO and aneurysmal intra-atrial septum that is concerning if the patient has prior stroke dose or symptoms of TIA. Because of her recurrent visual changes we will order a brain CT to see if there any changes suggesting prior small strokes. Postpartum the patient on baby aspirin daily. We will also start her on hydrochlorothiazide 25 mg daily diet and combination with losartan should help with her blood pressure and improve her diastolic function.  There is elevation in LDL cholesterol in June of 2014, she was placed on the Lipitor 20 mg daily we will recheck her cholesterol 6 months from now.  We will follow the patient 3 weeks from now.    Tobias Alexander, Rexene Edison, MD 06/04/2013, 9:52 AM

## 2013-06-04 NOTE — Patient Instructions (Addendum)
Your physician recommends that you have a head CT. There are no special instructions for this test  Your physician has recommended you make the following change in your medication: start taking Aspirin 81 mg daily and Hydrochlorothiazide 25 mg daily   Your physician recommends that you schedule a follow-up appointment in: 3 to 4 weeks

## 2013-06-08 ENCOUNTER — Ambulatory Visit (INDEPENDENT_AMBULATORY_CARE_PROVIDER_SITE_OTHER)
Admission: RE | Admit: 2013-06-08 | Discharge: 2013-06-08 | Disposition: A | Discharge status: Home / Self Care | Payer: BC Managed Care – PPO | Source: Ambulatory Visit | Attending: Cardiology | Admitting: Cardiology

## 2013-06-08 DIAGNOSIS — H539 Unspecified visual disturbance: Secondary | ICD-10-CM

## 2013-06-10 ENCOUNTER — Ambulatory Visit (INDEPENDENT_AMBULATORY_CARE_PROVIDER_SITE_OTHER): Payer: BC Managed Care – PPO | Admitting: *Deleted

## 2013-06-10 DIAGNOSIS — Z23 Encounter for immunization: Secondary | ICD-10-CM

## 2013-06-21 ENCOUNTER — Other Ambulatory Visit: Payer: Self-pay | Admitting: Family Medicine

## 2013-07-02 ENCOUNTER — Ambulatory Visit: Payer: BC Managed Care – PPO | Admitting: Cardiology

## 2013-07-03 ENCOUNTER — Other Ambulatory Visit: Payer: Self-pay | Admitting: Internal Medicine

## 2013-07-03 DIAGNOSIS — E041 Nontoxic single thyroid nodule: Secondary | ICD-10-CM

## 2013-07-14 ENCOUNTER — Other Ambulatory Visit (HOSPITAL_COMMUNITY)
Admission: RE | Admit: 2013-07-14 | Discharge: 2013-07-14 | Disposition: A | Discharge status: Home / Self Care | Payer: BC Managed Care – PPO | Source: Ambulatory Visit | Attending: Diagnostic Radiology | Admitting: Diagnostic Radiology

## 2013-07-14 ENCOUNTER — Ambulatory Visit
Admission: RE | Admit: 2013-07-14 | Discharge: 2013-07-14 | Disposition: A | Discharge status: Home / Self Care | Payer: BC Managed Care – PPO | Source: Ambulatory Visit | Attending: Internal Medicine | Admitting: Internal Medicine

## 2013-07-14 DIAGNOSIS — E041 Nontoxic single thyroid nodule: Secondary | ICD-10-CM

## 2013-07-23 ENCOUNTER — Telehealth: Payer: Self-pay | Admitting: Family Medicine

## 2013-07-23 NOTE — Telephone Encounter (Signed)
Called pt regarding prior auth for desonate, pt said she did not to try and get it approved. She states she will be fine without the cream, has others at home.

## 2013-07-27 ENCOUNTER — Ambulatory Visit (INDEPENDENT_AMBULATORY_CARE_PROVIDER_SITE_OTHER): Payer: BC Managed Care – PPO | Admitting: Family Medicine

## 2013-07-27 ENCOUNTER — Encounter: Payer: Self-pay | Admitting: Family Medicine

## 2013-07-27 VITALS — BP 130/80 | HR 92 | Temp 98.5°F | Ht 65.25 in | Wt 162.1 lb

## 2013-07-27 DIAGNOSIS — I10 Essential (primary) hypertension: Secondary | ICD-10-CM

## 2013-07-27 DIAGNOSIS — F329 Major depressive disorder, single episode, unspecified: Secondary | ICD-10-CM

## 2013-07-27 DIAGNOSIS — M25511 Pain in right shoulder: Secondary | ICD-10-CM

## 2013-07-27 DIAGNOSIS — M25519 Pain in unspecified shoulder: Secondary | ICD-10-CM

## 2013-07-27 DIAGNOSIS — M3500 Sicca syndrome, unspecified: Secondary | ICD-10-CM

## 2013-07-27 DIAGNOSIS — E119 Type 2 diabetes mellitus without complications: Secondary | ICD-10-CM

## 2013-07-27 DIAGNOSIS — M329 Systemic lupus erythematosus, unspecified: Secondary | ICD-10-CM

## 2013-07-27 DIAGNOSIS — Z23 Encounter for immunization: Secondary | ICD-10-CM

## 2013-07-27 NOTE — Progress Notes (Signed)
Pre visit review using our clinic review tool, if applicable. No additional management support is needed unless otherwise documented below in the visit note. 

## 2013-07-27 NOTE — Assessment & Plan Note (Signed)
Well controlled, no changes 

## 2013-07-27 NOTE — Progress Notes (Signed)
Patient ID: Lori Zamora, female   DOB: [birth year], 57 y.o.   MRN: [id number] AMYRIE ILLINGWORTH [id number] 1956/06/22 07/27/2013      Progress Note-Follow Up  Subjective  Chief Complaint  Chief Complaint  Patient presents with  . Follow-up    6 month  . Injections    prevnar    HPI  Patient is a 57 year old African American female who is in today with ongoing health concerns. Her biggest complaint is of persistent pain. She's been seeking care at [facility] for Sjogren's and lupus but is having difficulty getting air and is frustrated with her course of treatment at this time. No acute recent illness but does continue to struggle with her muscle twitches secondary to connective tissue disorder lupus. She continues to struggle with photosensitivity secondary to her to mental myositis she reports this was diagnosed at [facility]. She also reports that due to his recently done lab work which showed elevated alkaline phosphatase and then [facility] diagnosed her with that mixed connective tissue disorder. She continues to struggle with right shoulder pain despite her recent course of physical therapy. She recently had an echocardiogram in [facility] with a bubble study she says showed a septal defect but this is unavailable. She also reports she believes she has an aortic aneurysm may be in the thorax. Records unavailable. [facility] [place]. His following with endocrinology thyroid ultrasound did show some nodules on the right. No concerns identified. Follows with endocrinology for her diabetes and reports this is doing well. Stress and depression secondary to her chronic pain and follows with psychiatry for this.  Past Medical History  Diagnosis Date  . Unspecified sleep apnea   . Acquired acanthosis nigricans   . Unspecified vitamin D deficiency   . Bipolar disorder, unspecified   . Degeneration of intervertebral disc, site unspecified   . Other thalassemia   . Other  specified iron deficiency anemias   . Esophageal reflux   . Systemic lupus erythematosus   . Unspecified constipation   . Migraine with aura, without mention of intractable migraine without mention of status migrainosus   . Anemia, unspecified   . Nontoxic multinodular goiter   . Myalgia and myositis, unspecified   . Other malaise and fatigue   . Unspecified essential hypertension   . Other and unspecified hyperlipidemia   . Type II or unspecified type diabetes mellitus without mention of complication, not stated as uncontrolled   . Depressive disorder, not elsewhere classified   . 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 Sjogren's syndrome   . CFS (chronic fatigue syndrome)   . Unspecified diffuse connective tissue disease 01/27/2013    Follows at [facility] rheumatology, Dr Guss Bunde Per patient has tested 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 antibody  . Renal insufficiency 01/27/2013  . Pelvic floor dysfunction 01/27/2013    Past Surgical History  Procedure Laterality Date  . Laparoscopic total hysterectomy  2004    Family History  Problem Relation Age of Onset  . Diabetes Mother   . Obesity Sister   . Alcohol abuse      Family history of alcoholism and addiction  . Hypertension      family history  . Kidney disease      family history  . Stroke      1st degree relative <60  . Heart disease Mother     family  history  . Allergies Mother     History   Social History  . Marital Status: Single    Spouse Name: N/A    Number of Children: 0  . Years of Education: N/A   Occupational History  . Teacher    Social History Main Topics  . Smoking status: Never Smoker   . Smokeless tobacco: Never Used  . Alcohol Use: No  . Drug Use: No  . Sexual Activity: Not on file   Other Topics Concern  . 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 amLODipine (NORVASC) 10 MG tablet TAKE 1 TABLET BY MOUTH EVERY DAY  30 tablet  1  . atorvastatin (LIPITOR) 20 MG tablet TAKE 3 TABLETS BY MOUTH EVERY DAY  90 tablet  1  . cholecalciferol (VITAMIN D) 1000 UNITS tablet Take 2,000 Units by mouth daily.       . DESONATE 0.05 % gel APPLY TOPICALLY TWICE DAILY  60 g  0  . diphenhydrAMINE (BENADRYL) 25 MG tablet Take 25 mg by mouth as needed.      Marland Kitchen glucose blood test strip 1 each by Other route daily. One Touch Ultra       . ibuprofen (ADVIL) 200 MG tablet Take 200 mg by mouth every 6 (six) hours as needed.        Marland Kitchen ketoconazole (NIZORAL) 2 % cream Apply topically as directed.      . lamoTRIgine (LAMICTAL) 100 MG tablet Take 100 mg by mouth daily.      . lansoprazole (PREVACID) 15 MG capsule Take 2 capsules (30 mg total) by mouth 2 (two) times daily.  360 capsule  1  . Lidocaine, Anorectal, 5 % CREA Apply topically as needed.      . Magnesium Oxide 500 MG (LAX) TABS Take by mouth daily. Take 1-4 tablets daily.      . metFORMIN (GLUCOPHAGE) 500 MG tablet Take 500 mg by mouth 2 (two) times daily with a meal.       . naphazoline (CLEAR EYES) 0.012 % ophthalmic solution Place 1 drop into both eyes 4 (four) times daily.        Marland Kitchen RA SUNSCREEN SPF50 EX Apply topically as needed.        . sitaGLIPtan (JANUVIA) 100 MG tablet Take 100 mg by mouth daily.        . valsartan (DIOVAN) 80 MG tablet Take 1 tablet (80 mg total) by mouth daily.  30 tablet  5  . zolpidem (AMBIEN) 10 MG tablet TAKE ONE TABLET BY MOUTH AT BEDTIME AS NEEDED FOR SLEEP  90 tablet  1   No current facility-administered medications on file prior to visit.    Allergies  Allergen Reactions  . Acetazolamide   . Azathioprine   . Ezetimibe   . Latex   . Lisinopril   . Meloxicam   . Penicillins   . Pravastatin Sodium   . Simvastatin   . Sulfonamide Derivatives     REACTION: Swelling of  tongue and face    Review of Systems Review of Systems  Constitutional: Negative for fever, chills and malaise/fatigue.  HENT: Negative for congestion, hearing loss and nosebleeds.   Eyes: Negative for discharge.  Respiratory: Negative for cough, sputum production, shortness of breath and wheezing.   Cardiovascular: Negative for chest pain,  palpitations and leg swelling.  Gastrointestinal: Negative for heartburn, nausea, vomiting, abdominal pain, diarrhea, constipation and blood in stool.  Genitourinary: Negative for dysuria, urgency, frequency and hematuria.  Musculoskeletal: Negative for back pain, falls and myalgias.  Skin: Negative for rash.  Neurological: Negative for dizziness, tremors, sensory change, focal weakness, loss of consciousness, weakness and headaches.  Endo/Heme/Allergies: Negative for polydipsia. Does not bruise/bleed easily.  Psychiatric/Behavioral: Negative for depression and suicidal ideas. The patient is not nervous/anxious and does not have insomnia.     Objective  BP 130/80  Pulse 92  Temp(Src) 98.5 F (36.9 C) (Oral)  Ht 5' 5.25" (1.657 m)  Wt 162 lb 1.3 oz (73.519 kg)  BMI 26.78 kg/m2  SpO2 97%  Physical Exam  Physical Exam  Constitutional: She is oriented to person, place, and time and well-developed, well-nourished, and in no distress. No distress.  HENT:  Head: Normocephalic and atraumatic.  Eyes: Conjunctivae are normal.  Neck: Neck supple. No thyromegaly present.  Cardiovascular: Normal rate, regular rhythm and normal heart sounds.   No murmur heard. Pulmonary/Chest: Effort normal and breath sounds normal. She has no wheezes.  Abdominal: She exhibits no distension and no mass.  Musculoskeletal: She exhibits no edema.  Lymphadenopathy:    She has no cervical adenopathy.  Neurological: She is alert and oriented to person, place, and time.  Skin: Skin is warm and dry. No rash noted. She is not diaphoretic.  Psychiatric: Memory, affect and  judgment normal.    Lab Results  Component Value Date   TSH 0.95 01/18/2010   TSH 0.95 01/18/2010   Lab Results  Component Value Date   WBC 7.9 01/27/2013   HGB 12.7 01/27/2013   HCT 40.2 01/27/2013   MCV 79.6 01/27/2013   PLT 474* 01/27/2013   Lab Results  Component Value Date   CREATININE 0.9 11/27/2011   BUN 12 11/27/2011   NA 140 11/27/2011   K 3.8 11/27/2011   CL 103 11/27/2011   CO2 28 11/27/2011   Lab Results  Component Value Date   ALT 16 07/31/2012   AST 13 07/31/2012   ALKPHOS 112 07/31/2012   BILITOT 0.2* 07/31/2012   Lab Results  Component Value Date   CHOL 200 01/27/2013   Lab Results  Component Value Date   HDL 49 01/27/2013   Lab Results  Component Value Date   LDLCALC 125* 01/27/2013   Lab Results  Component Value Date   TRIG 131 01/27/2013   Lab Results  Component Value Date   CHOLHDL 4.1 01/27/2013     Assessment & Plan  HYPERTENSION Well controlled, no changes  Sjogren's syndrome Is following with [facility] rheumatology and it is not going well. Frustrated with care. Recent exam at rheumatology confirmed dry eyes. Is considering transferring her care to [facility] closer to home. She struggles with chronic pain secondary to Sjogren's, Lupus  etc  LUPUS Confirmed by punch biopsy done by Derm at [facility]  DIABETES MELLITUS, TYPE II Will request last labs from endocrinology patient reports good response. Is seeing Opthamology next week  Shoulder pain Underwent physical therapy without much benefit recently.   DEPRESSION Struggles with stress secondary to her chronic pain and medical conditions. Follows with psychiatry

## 2013-07-27 NOTE — Assessment & Plan Note (Addendum)
Is following with [facility] rheumatology and it is not going well. Frustrated with care. Recent exam at rheumatology confirmed dry eyes. Is considering transferring her care to [facility] closer to home. She struggles with chronic pain secondary to Sjogren's, Lupus  etc

## 2013-07-27 NOTE — Assessment & Plan Note (Signed)
Confirmed by punch biopsy done by Derm at [facility]

## 2013-07-29 ENCOUNTER — Telehealth: Payer: Self-pay | Admitting: Family Medicine

## 2013-07-29 ENCOUNTER — Encounter: Payer: Self-pay | Admitting: Physician Assistant

## 2013-07-29 ENCOUNTER — Ambulatory Visit (INDEPENDENT_AMBULATORY_CARE_PROVIDER_SITE_OTHER): Payer: BC Managed Care – PPO | Admitting: Physician Assistant

## 2013-07-29 VITALS — BP 122/76 | HR 78 | Temp 98.3°F | Resp 16 | Ht 65.25 in | Wt 164.5 lb

## 2013-07-29 DIAGNOSIS — T50905A Adverse effect of unspecified drugs, medicaments and biological substances, initial encounter: Secondary | ICD-10-CM | POA: Insufficient documentation

## 2013-07-29 DIAGNOSIS — T887XXA Unspecified adverse effect of drug or medicament, initial encounter: Secondary | ICD-10-CM

## 2013-07-29 NOTE — Patient Instructions (Signed)
Increase fluid intake.  Rest.  Tylenol for tenderness in arm.  I feel this was a mild reaction to your pneumonia vaccination.  I do not seen any evidence of skin infection or other worrisome findings.  Please call or return to clinic if fever returns.

## 2013-07-29 NOTE — Progress Notes (Signed)
Pre visit review using our clinic review tool, if applicable. No additional management support is needed unless otherwise documented below in the visit note/SLS  

## 2013-07-29 NOTE — Telephone Encounter (Signed)
Patient Information:  Caller Name: Talita  Phone: [phone]  Patient: Lori Zamora, Lori Zamora  Gender: Female  DOB: Jun 15, 1956  Age: 57 Years  PCP: Danise Edge [facility])  Office Follow Up:  Does the office need to follow up with this patient?: No  Instructions For The Office: N/A  RN Note:  Pain lessening in arm after pneunomia vaccine.  3-4" lump at injection site. Flu symptoms are mild. Sudden onset fever at 0500.   Symptoms  Reason For Call & Symptoms: Pain in left arm from shoulder to elbow following pneumonia vaccine 07/27/13.  Also developed flu like symptoms 07/28/13 with aching, chills, nasal congestion, headache and fever.  Fever gone now without fever medication. FBS 81 at 0830  Reviewed Health History In EMR: Yes  Reviewed Medications In EMR: Yes  Reviewed Allergies In EMR: Yes  Reviewed Surgeries / Procedures: Yes  Date of Onset of Symptoms: 07/27/2013  Any Fever: Yes  Fever Taken: Oral  Fever Time Of Reading: 05:00:00  Fever Last Reading: 101.9  Guideline(s) Used:  Immunization Reactions  Disposition Per Guideline:   Go to Office Now  Reason For Disposition Reached:   Fever > 100.5 F (38.1 C) and has diabetes mellitus or a weak immune system (e.g., HIV positive, cancer chemotherapy, organ transplant, splenectomy, chronic steroids)  Advice Given:  Pain and Fever Medicines:  For pain or fever relief, take either acetaminophen or ibuprofen.  Call Back If:  Fever lasts more than 3 days  Pain lasts more than 3 days  Injection site starts to look infected  You become worse.  Patient Will Follow Care Advice:  YES  Appointment Scheduled:  07/29/2013 13:00:00 Appointment Scheduled Provider:  Marcelline Mates "Selena Batten"

## 2013-07-29 NOTE — Progress Notes (Signed)
Patient ID: Lori Zamora, female   DOB: Dec 22, 1955, 57 y.o.   MRN: [id number]  Patient presents to clinic today c/o arm tenderness, muscle aches, chills and transient fever after having the pneumonia vaccine 2 days ago. Patient denies ever having a reaction to vaccination before. Patient states her left arm is very sore. Has also noticed some warmth injection site. Endorses feeling a lump under injection site.  Patient denies rash or shortness of breath.  Patient denies erythema and injection site. Patient endorses nasal congestion since this morning. Has had her flu shot. Temperature in clinic is 98.3. Patient has not taken Tylenol.   Past Medical History  Diagnosis Date  . Unspecified sleep apnea   . Acquired acanthosis nigricans   . Unspecified vitamin D deficiency   . Bipolar disorder, unspecified   . Degeneration of intervertebral disc, site unspecified   . Other thalassemia   . Other specified iron deficiency anemias   . Esophageal reflux   . Systemic lupus erythematosus   . Unspecified constipation   . Migraine with aura, without mention of intractable migraine without mention of status migrainosus   . Anemia, unspecified   . Nontoxic multinodular goiter   . Myalgia and myositis, unspecified   . Other malaise and fatigue   . Unspecified essential hypertension   . Other and unspecified hyperlipidemia   . Type II or unspecified type diabetes mellitus without mention of complication, not stated as uncontrolled   . Depressive disorder, not elsewhere classified   . 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 Sjogren's syndrome   . CFS (chronic fatigue syndrome)   . Unspecified diffuse connective tissue disease 01/27/2013    Follows at [facility] rheumatology, Dr Guss Bunde Per patient has tested 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 antibody  . Renal insufficiency 01/27/2013  . Pelvic floor dysfunction 01/27/2013     Current Outpatient Prescriptions on File Prior to Visit  Medication Sig Dispense Refill  . amLODipine (NORVASC) 10 MG tablet TAKE 1 TABLET BY MOUTH EVERY DAY  30 tablet  1  . atorvastatin (LIPITOR) 20 MG tablet TAKE 3 TABLETS BY MOUTH EVERY DAY  90 tablet  1  . cholecalciferol (VITAMIN D) 1000 UNITS tablet Take 2,000 Units by mouth daily.       . DESONATE 0.05 % gel APPLY TOPICALLY TWICE DAILY  60 g  0  . diphenhydrAMINE (BENADRYL) 25 MG tablet Take 25 mg by mouth as needed.      Marland Kitchen glucose blood test strip 1 each by Other route daily. One Touch Ultra       . ibuprofen (ADVIL) 200 MG tablet Take 200 mg by mouth every 6 (six) hours as needed.        Marland Kitchen ketoconazole (NIZORAL) 2 % cream Apply topically as directed.      . lamoTRIgine (LAMICTAL) 100 MG tablet Take 100 mg by mouth daily.      . lansoprazole (PREVACID) 15 MG capsule Take 2 capsules (30 mg total) by mouth 2 (two) times daily.  360 capsule  1  . Lidocaine, Anorectal, 5 % CREA Apply topically as needed.      . Magnesium Oxide 500 MG (LAX) TABS Take by mouth daily. Take 1-4 tablets daily.      . metFORMIN (GLUCOPHAGE) 500 MG tablet Take 500 mg by mouth 2 (two) times daily with a meal.       . naphazoline (CLEAR EYES)  0.012 % ophthalmic solution Place 1 drop into both eyes 4 (four) times daily.        Marland Kitchen RA SUNSCREEN SPF50 EX Apply topically as needed.        . sitaGLIPtan (JANUVIA) 100 MG tablet Take 100 mg by mouth daily.        . valsartan (DIOVAN) 80 MG tablet Take 1 tablet (80 mg total) by mouth daily.  30 tablet  5  . zolpidem (AMBIEN) 10 MG tablet TAKE ONE TABLET BY MOUTH AT BEDTIME AS NEEDED FOR SLEEP  90 tablet  1   No current facility-administered medications on file prior to visit.    Allergies  Allergen Reactions  . Acetazolamide   . Azathioprine   . Ezetimibe   . Latex   . Lisinopril   . Meloxicam   . Penicillins   . Pravastatin Sodium   . Simvastatin   . Sulfonamide Derivatives     REACTION: Swelling of  tongue and face    Family History  Problem Relation Age of Onset  . Diabetes Mother   . Obesity Sister   . Alcohol abuse      Family history of alcoholism and addiction  . Hypertension      family history  . Kidney disease      family history  . Stroke      1st degree relative <60  . Heart disease Mother     family history  . Allergies Mother     History   Social History  . Marital Status: Single    Spouse Name: N/A    Number of Children: 0  . Years of Education: N/A   Occupational History  . Teacher    Social History Main Topics  . Smoking status: Never Smoker   . Smokeless tobacco: Never Used  . Alcohol Use: No  . Drug Use: No  . Sexual Activity: None   Other Topics Concern  . 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 All other review of systems are negative.  Filed Vitals:   07/29/13 1306  BP: 122/76  Pulse: 78  Temp: 98.3 F (36.8 C)  Resp: 16   Physical Exam  Constitutional: She is oriented to person, place, and time and well-developed, well-nourished, and in no distress.  HENT:  Head: Normocephalic and atraumatic.  Right Ear: External ear normal.  Left Ear: External ear normal.  Nose: Nose normal.  Mouth/Throat: Oropharynx is clear and moist. No oropharyngeal exudate.  Eyes: Conjunctivae are normal.  Neck: Neck supple.  Cardiovascular: Normal rate, regular rhythm and normal heart sounds.   Pulmonary/Chest: Effort normal and breath sounds normal.  Neurological: She is alert and oriented to person, place, and time.  Skin: Skin is warm and dry. No rash noted. No erythema.  No sign of erythema or warmth of injection site. No palpable mass at injection site noted on examination.  Psychiatric: Affect normal.   Recent Results (from the past 2160 hour(s))  AMMONIA     Status: None   Collection Time    05/04/13  9:30 AM      Result Value Range    Ammonia 45  16 - 53 umol/L   Comment:       Proper collection of samples to be analyzed for plasma Ammonia is     essential. The Ammonia content of whole blood increases  rapidly at     room temperature. Plasma should be removed from red blood cells and     frozen immediately after venipuncture.        PULMONARY FUNCTION TEST     Status: None   Collection Time    05/14/13 10:48 AM      Result Value Range   FEV1       FVC       FEV1/FVC       TLC       DLCO      PULMONARY FUNCTION TEST     Status: None   Collection Time    05/14/13 12:16 PM      Result Value Range   FEV1       FVC       FEV1/FVC       TLC       DLCO       Assessment/Plan: Medication reaction No sign of inflammation, cellulitis or reaction on examination. Possible that the reaction has dissipated. Patient instructed to monitor his symptoms for recurrence. Tylenol for pain and arm tenderness.  Patient to call if new symptoms develop.

## 2013-07-29 NOTE — Assessment & Plan Note (Signed)
No sign of inflammation, cellulitis or reaction on examination. Possible that the reaction has dissipated. Patient instructed to monitor his symptoms for recurrence. Tylenol for pain and arm tenderness.  Patient to call if new symptoms develop.

## 2013-08-02 NOTE — Assessment & Plan Note (Signed)
Underwent physical therapy without much benefit recently.

## 2013-08-02 NOTE — Assessment & Plan Note (Signed)
Will request last labs from endocrinology patient reports good response. Is seeing Opthamology next week

## 2013-08-02 NOTE — Assessment & Plan Note (Signed)
Struggles with stress secondary to her chronic pain and medical conditions. Follows with psychiatry

## 2013-08-19 ENCOUNTER — Ambulatory Visit (INDEPENDENT_AMBULATORY_CARE_PROVIDER_SITE_OTHER): Payer: BC Managed Care – PPO | Admitting: Physician Assistant

## 2013-08-19 ENCOUNTER — Other Ambulatory Visit: Payer: Self-pay | Admitting: Family Medicine

## 2013-08-19 ENCOUNTER — Encounter: Payer: Self-pay | Admitting: Physician Assistant

## 2013-08-19 VITALS — BP 114/78 | HR 84 | Temp 98.4°F | Resp 16 | Ht 65.25 in | Wt 170.5 lb

## 2013-08-19 DIAGNOSIS — H6120 Impacted cerumen, unspecified ear: Secondary | ICD-10-CM

## 2013-08-19 DIAGNOSIS — H6121 Impacted cerumen, right ear: Secondary | ICD-10-CM

## 2013-08-19 NOTE — Progress Notes (Signed)
Patient ID: Lori Zamora, female   DOB: Mar 25, 1956, 58 y.o.   MRN: [id number]  Patient presents to clinic today complaining of decreased hearing and fullness of R ear.  Patient states symptoms began after she tried to flush our her ears at home.  Has history of excess cerumen and cerumen impaction.  Patient denies ear pain, fever or drainage from her R ear.  Denies symptoms with her left ear.  Denies tinnitus, vertigo or LH.  Past Medical History  Diagnosis Date  . Unspecified sleep apnea   . Acquired acanthosis nigricans   . Unspecified vitamin D deficiency   . Bipolar disorder, unspecified   . Degeneration of intervertebral disc, site unspecified   . Other thalassemia   . Other specified iron deficiency anemias   . Esophageal reflux   . Systemic lupus erythematosus   . Unspecified constipation   . Migraine with aura, without mention of intractable migraine without mention of status migrainosus   . Anemia, unspecified   . Nontoxic multinodular goiter   . Myalgia and myositis, unspecified   . Other malaise and fatigue   . Unspecified essential hypertension   . Other and unspecified hyperlipidemia   . Type II or unspecified type diabetes mellitus without mention of complication, not stated as uncontrolled   . Depressive disorder, not elsewhere classified   . 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 Sjogren's syndrome   . CFS (chronic fatigue syndrome)   . Unspecified diffuse connective tissue disease 01/27/2013    Follows at [facility] rheumatology, Dr Alanda Amass Per patient has tested 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 antibody  . Renal insufficiency 01/27/2013  . Pelvic floor dysfunction 01/27/2013    Current Outpatient Prescriptions on File Prior to Visit  Medication Sig Dispense Refill  . amLODipine (NORVASC) 10 MG tablet TAKE 1 TABLET BY MOUTH EVERY DAY  30 tablet  1  . atorvastatin (LIPITOR) 20 MG tablet  TAKE 3 TABLETS BY MOUTH EVERY DAY  90 tablet  1  . cholecalciferol (VITAMIN D) 1000 UNITS tablet Take 2,000 Units by mouth daily.       . DESONATE 0.05 % gel APPLY TOPICALLY TWICE DAILY  60 g  0  . diphenhydrAMINE (BENADRYL) 25 MG tablet Take 25 mg by mouth as needed.      Marland Kitchen glucose blood test strip 1 each by Other route daily. One Touch Ultra       . ibuprofen (ADVIL) 200 MG tablet Take 200 mg by mouth every 6 (six) hours as needed.        Marland Kitchen ketoconazole (NIZORAL) 2 % cream Apply topically as directed.      . lamoTRIgine (LAMICTAL) 100 MG tablet Take 100 mg by mouth daily.      . lansoprazole (PREVACID) 15 MG capsule Take 2 capsules (30 mg total) by mouth 2 (two) times daily.  360 capsule  1  . Lidocaine, Anorectal, 5 % CREA Apply topically as needed.      . Magnesium Oxide 500 MG (LAX) TABS Take by mouth daily. Take 1-4 tablets daily.      . metFORMIN (GLUCOPHAGE) 500 MG tablet Take 500 mg by mouth 2 (two) times daily with a meal.       . naphazoline (CLEAR EYES) 0.012 % ophthalmic solution Place 1 drop into both eyes 4 (four) times daily.        Marland Kitchen RA SUNSCREEN SPF50 EX Apply topically  as needed.        . sitaGLIPtan (JANUVIA) 100 MG tablet Take 100 mg by mouth daily.        . valsartan (DIOVAN) 80 MG tablet Take 1 tablet (80 mg total) by mouth daily.  30 tablet  5  . zolpidem (AMBIEN) 10 MG tablet TAKE ONE TABLET BY MOUTH AT BEDTIME AS NEEDED FOR SLEEP  90 tablet  1   No current facility-administered medications on file prior to visit.    Allergies  Allergen Reactions  . Acetazolamide   . Azathioprine   . Ezetimibe   . Latex   . Lisinopril   . Meloxicam   . Penicillins   . Pravastatin Sodium   . Simvastatin   . Sulfonamide Derivatives     REACTION: Swelling of tongue and face    Family History  Problem Relation Age of Onset  . Diabetes Mother   . Obesity Sister   . Alcohol abuse      Family history of alcoholism and addiction  . Hypertension      family history  .  Kidney disease      family history  . Stroke      1st degree relative <60  . Heart disease Mother     family history  . Allergies Mother     History   Social History  . Marital Status: Single    Spouse Name: N/A    Number of Children: 0  . Years of Education: N/A   Occupational History  . Teacher    Social History Main Topics  . Smoking status: Never Smoker   . Smokeless tobacco: Never Used  . Alcohol Use: No  . Drug Use: No  . Sexual Activity: None   Other Topics Concern  . 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  All other ROS are negative.  Filed Vitals:   08/19/13 1010  BP: 114/78  Pulse: 84  Temp: 98.4 F (36.9 C)  Resp: 16   Physical Exam  Vitals reviewed. Constitutional: She is well-developed, well-nourished, and in no distress.  HENT:  Head: Normocephalic and atraumatic.  Right Ear: External ear and ear canal normal.  Left Ear: Tympanic membrane, external ear and ear canal normal.  Nose: Nose normal.  Mouth/Throat: Oropharynx is clear and moist. No oropharyngeal exudate.  R ear with cerumen impaction.  Eyes: Conjunctivae are normal. Pupils are equal, round, and reactive to light.  Neck: Neck supple.  Cardiovascular: Normal rate and regular rhythm.   Pulmonary/Chest: Effort normal.    No results found for this or any previous visit (from the past 2160 hour(s)).  Assessment/Plan: Cerumen impaction Impaction of R ear with decreased hearing.  Cerumen could not be removed with curette.  Ear irrigation performed by nursing staff with successful removal of cerumen.  Repeat otoscopic examination reveals normal TM.  Patient's hearing has improved.

## 2013-08-19 NOTE — Assessment & Plan Note (Signed)
Impaction of R ear with decreased hearing.  Cerumen could not be removed with curette.  Ear irrigation performed by nursing staff with successful removal of cerumen.  Repeat otoscopic examination reveals normal TM.  Patient's hearing has improved.

## 2013-08-19 NOTE — Progress Notes (Signed)
Pre visit review using our clinic review tool, if applicable. No additional management support is needed unless otherwise documented below in the visit note/SLS  

## 2013-08-20 ENCOUNTER — Telehealth: Payer: Self-pay | Admitting: Family Medicine

## 2013-08-20 NOTE — Telephone Encounter (Signed)
Received medical records from [facility]. Buddy Duty

## 2013-08-20 NOTE — Telephone Encounter (Signed)
atorvastatin (LIPITOR) 20 MG tablet 90 tablet 1 06/21/2013 Sig: TAKE 3 TABLETS BY MOUTH EVERY DAY E-Prescribing Status: Receipt confirmed by pharmacy (06/22/2013 9:32 AM EST) Rx request Denied-Too Soon/SLS

## 2013-08-21 ENCOUNTER — Other Ambulatory Visit: Payer: Self-pay | Admitting: Family Medicine

## 2013-08-21 MED ORDER — ATORVASTATIN CALCIUM 20 MG PO TABS: 60.0000 mg | ORAL_TABLET | Freq: Every day | ORAL | Status: DC

## 2013-08-21 NOTE — Telephone Encounter (Signed)
Patient went to the pharmacy to pick up her medicine.  There is confusion as to the quantity she takes  She takes 3 20 mg tablets per day and therefore needs a quantity of 90 for a one month supply

## 2013-08-21 NOTE — Telephone Encounter (Signed)
New rx sent

## 2013-08-21 NOTE — Addendum Note (Signed)
Addended by: Varney Daily on: 08/21/2013 11:45 AM   Modules accepted: Orders

## 2013-09-20 ENCOUNTER — Other Ambulatory Visit: Payer: Self-pay | Admitting: Family Medicine

## 2013-09-21 ENCOUNTER — Encounter: Payer: Self-pay | Admitting: Family

## 2013-09-21 ENCOUNTER — Ambulatory Visit (INDEPENDENT_AMBULATORY_CARE_PROVIDER_SITE_OTHER): Payer: BC Managed Care – PPO | Admitting: Family

## 2013-09-21 VITALS — BP 124/76 | HR 96 | Temp 97.8°F | Resp 12 | Ht 65.25 in | Wt 174.0 lb

## 2013-09-21 DIAGNOSIS — J329 Chronic sinusitis, unspecified: Secondary | ICD-10-CM | POA: Insufficient documentation

## 2013-09-21 MED ORDER — LEVOFLOXACIN 750 MG PO TABS: 750.0000 mg | ORAL_TABLET | Freq: Every day | ORAL | Status: DC

## 2013-09-21 NOTE — Progress Notes (Signed)
Pre visit review using our clinic review tool, if applicable. No additional management support is needed unless otherwise documented below in the visit note. 

## 2013-09-21 NOTE — Assessment & Plan Note (Addendum)
Start Levaquin 750mg  daily x 5 days. Follow up in 2 to 3 days if symptoms do not improve.

## 2013-09-21 NOTE — Progress Notes (Signed)
Subjective:    Patient ID: Lori Zamora, female    DOB: 1956-06-29, 58 y.o.   MRN: [id number]  HPI Lori Zamora is a 58 year old female who presents today with a chief complaint of sneezing, body aches, sinus congestion, rhinorrhea, tearing of eyes, bilateral ear pain x 8 days.  Denies associated fever, cough, and sore throat. Patient reports associated bilateral lower rib pain from sneezing. Patient has tried benadryl and tylenol, and OTC cold medication without relief.   Review of Systems  Constitutional: Positive for appetite change and fatigue. Negative for fever and chills.  HENT: Positive for congestion, ear pain, postnasal drip, rhinorrhea and sneezing. Negative for sore throat.   Eyes:       Reports watery eyes.  Respiratory: Negative for cough and shortness of breath.   Cardiovascular: Negative for chest pain.  Gastrointestinal: Negative for nausea and vomiting.   Past Medical History  Diagnosis Date  . Unspecified sleep apnea   . Acquired acanthosis nigricans   . Unspecified vitamin D deficiency   . Bipolar disorder, unspecified   . Degeneration of intervertebral disc, site unspecified   . Other thalassemia   . Other specified iron deficiency anemias   . Esophageal reflux   . Systemic lupus erythematosus   . Unspecified constipation   . Migraine with aura, without mention of intractable migraine without mention of status migrainosus   . Anemia, unspecified   . Nontoxic multinodular goiter   . Myalgia and myositis, unspecified   . Other malaise and fatigue   . Unspecified essential hypertension   . Other and unspecified hyperlipidemia   . Type II or unspecified type diabetes mellitus without mention of complication, not stated as uncontrolled   . Depressive disorder, not elsewhere classified   . 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 Sjogren's syndrome   . CFS (chronic fatigue syndrome)   . Unspecified diffuse connective  tissue disease 01/27/2013    Follows at [facility] [place] rheumatology, Dr Alanda Amass Per patient has tested 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 antibody  . Renal insufficiency 01/27/2013  . Pelvic floor dysfunction 01/27/2013    History   Social History  . Marital Status: Single    Spouse Name: N/A    Number of Children: 0  . Years of Education: N/A   Occupational History  . Teacher    Social History Main Topics  . Smoking status: Never Smoker   . Smokeless tobacco: Never Used  . Alcohol Use: No  . Drug Use: No  . Sexual Activity: Not on file   Other Topics Concern  . 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 Laparoscopic total hysterectomy  2004    Family History  Problem Relation Age of Onset  . Diabetes Mother   . Obesity Sister   . Alcohol abuse      Family history of alcoholism and addiction  . Hypertension      family history  . Kidney disease      family history  . Stroke      1st degree relative <60  . Heart disease Mother     family history  . Allergies Mother     Allergies  Allergen Reactions  . Acetazolamide   . Azathioprine   . Ezetimibe   .  Latex   . Lisinopril   . Meloxicam   . Penicillins   . Pravastatin Sodium   . Simvastatin   . Sulfonamide Derivatives     REACTION: Swelling of tongue and face    Current Outpatient Prescriptions on File Prior to Visit  Medication Sig Dispense Refill  . amLODipine (NORVASC) 10 MG tablet TAKE 1 TABLET BY MOUTH EVERY DAY  30 tablet  2  . atorvastatin (LIPITOR) 20 MG tablet Take 3 tablets (60 mg total) by mouth daily at 6 PM.  90 tablet  1  . cholecalciferol (VITAMIN D) 1000 UNITS tablet Take 2,000 Units by mouth daily.       . DESONATE 0.05 % gel APPLY TOPICALLY TWICE DAILY  60 g  0  . diphenhydrAMINE (BENADRYL) 25 MG tablet Take 25 mg by mouth as needed.        Marland Kitchen glucose blood test strip 1 each by Other route daily. One Touch Ultra       . ketoconazole (NIZORAL) 2 % cream Apply topically as directed.      . lamoTRIgine (LAMICTAL) 100 MG tablet Take 100 mg by mouth daily.      . lansoprazole (PREVACID) 15 MG capsule Take 2 capsules (30 mg total) by mouth 2 (two) times daily.  360 capsule  1  . Lidocaine, Anorectal, 5 % CREA Apply topically as needed.      . Magnesium Oxide 500 MG (LAX) TABS Take by mouth daily. Take 1-4 tablets daily.      . metFORMIN (GLUCOPHAGE) 500 MG tablet Take 500 mg by mouth 2 (two) times daily with a meal.       . naphazoline (CLEAR EYES) 0.012 % ophthalmic solution Place 1 drop into both eyes 4 (four) times daily.        Marland Kitchen RA SUNSCREEN SPF50 EX Apply topically as needed.        . sitaGLIPtan (JANUVIA) 100 MG tablet Take 100 mg by mouth daily.        . valsartan (DIOVAN) 80 MG tablet TAKE 1 TABLET BY MOUTH EVERY DAY  30 tablet  0  . zolpidem (AMBIEN) 10 MG tablet TAKE ONE TABLET BY MOUTH AT BEDTIME AS NEEDED FOR SLEEP  90 tablet  1  . ibuprofen (ADVIL) 200 MG tablet Take 200 mg by mouth every 6 (six) hours as needed.         No current facility-administered medications on file prior to visit.    BP 124/76  Pulse 96  Temp(Src) 97.8 F (36.6 C) (Oral)  Resp 12  Ht 5' 5.25" (1.657 m)  Wt 174 lb (78.926 kg)  BMI 28.75 kg/m2  SpO2 97%       Objective:   Physical Exam  Constitutional: She is oriented to person, place, and time. She appears well-nourished.  HENT:  Head: Normocephalic.  Eyes: Pupils are equal, round, and reactive to light.  Neck: Neck supple.  Cervical node noted to base of right neck. Mobile, non tender, about 1cm  Cardiovascular: Normal rate and regular rhythm.   Pulmonary/Chest: Effort normal and breath sounds normal. No respiratory distress. She has no wheezes.  Lymphadenopathy:    She has cervical adenopathy.  Neurological: She is alert and oriented to person, place, and time.  Skin: Skin is  warm and dry. No rash noted.  Psychiatric: She has a normal mood and affect.          Assessment & Plan:  I have personally seen and examined patient  and agree with Lori Friendly NP student's assessment and plan.

## 2013-09-21 NOTE — Patient Instructions (Addendum)
Start taking Levaquin 750mg  daily for 7 days. Follow up if symptoms do not improve in 2 to 3 days.

## 2013-09-23 ENCOUNTER — Encounter: Payer: Self-pay | Admitting: Family

## 2013-10-14 ENCOUNTER — Other Ambulatory Visit: Payer: Self-pay | Admitting: Family Medicine

## 2013-10-18 ENCOUNTER — Other Ambulatory Visit: Payer: Self-pay | Admitting: Family Medicine

## 2013-11-09 ENCOUNTER — Ambulatory Visit (INDEPENDENT_AMBULATORY_CARE_PROVIDER_SITE_OTHER): Payer: BC Managed Care – PPO

## 2013-11-09 DIAGNOSIS — Z23 Encounter for immunization: Secondary | ICD-10-CM

## 2013-11-19 ENCOUNTER — Other Ambulatory Visit: Payer: Self-pay | Admitting: Family Medicine

## 2013-11-26 ENCOUNTER — Ambulatory Visit (INDEPENDENT_AMBULATORY_CARE_PROVIDER_SITE_OTHER): Payer: BC Managed Care – PPO | Admitting: Family Medicine

## 2013-11-26 ENCOUNTER — Encounter: Payer: Self-pay | Admitting: Family Medicine

## 2013-11-26 VITALS — BP 118/70 | HR 99 | Temp 98.4°F | Ht 65.25 in | Wt 159.0 lb

## 2013-11-26 DIAGNOSIS — K6389 Other specified diseases of intestine: Secondary | ICD-10-CM

## 2013-11-26 DIAGNOSIS — M329 Systemic lupus erythematosus, unspecified: Secondary | ICD-10-CM

## 2013-11-26 DIAGNOSIS — E119 Type 2 diabetes mellitus without complications: Secondary | ICD-10-CM

## 2013-11-26 DIAGNOSIS — I10 Essential (primary) hypertension: Secondary | ICD-10-CM

## 2013-11-26 DIAGNOSIS — R894 Abnormal immunological findings in specimens from other organs, systems and tissues: Secondary | ICD-10-CM

## 2013-11-26 DIAGNOSIS — K638219 Small intestinal bacterial overgrowth, unspecified: Secondary | ICD-10-CM

## 2013-11-26 DIAGNOSIS — L299 Pruritus, unspecified: Secondary | ICD-10-CM

## 2013-11-26 DIAGNOSIS — R700 Elevated erythrocyte sedimentation rate: Secondary | ICD-10-CM

## 2013-11-26 DIAGNOSIS — E785 Hyperlipidemia, unspecified: Secondary | ICD-10-CM

## 2013-11-26 DIAGNOSIS — M3500 Sicca syndrome, unspecified: Secondary | ICD-10-CM

## 2013-11-26 DIAGNOSIS — R197 Diarrhea, unspecified: Secondary | ICD-10-CM

## 2013-11-26 DIAGNOSIS — R7689 Other specified abnormal immunological findings in serum: Secondary | ICD-10-CM

## 2013-11-26 DIAGNOSIS — R109 Unspecified abdominal pain: Secondary | ICD-10-CM

## 2013-11-26 DIAGNOSIS — Z23 Encounter for immunization: Secondary | ICD-10-CM

## 2013-11-26 DIAGNOSIS — B372 Candidiasis of skin and nail: Secondary | ICD-10-CM

## 2013-11-26 DIAGNOSIS — R768 Other specified abnormal immunological findings in serum: Secondary | ICD-10-CM

## 2013-11-26 LAB — CBC WITH DIFFERENTIAL/PLATELET
BASOS ABS: 0 10*3/uL (ref 0.0–0.1)
Basophils Relative: 0 % (ref 0–1)
Eosinophils Absolute: 0.1 10*3/uL (ref 0.0–0.7)
Eosinophils Relative: 1 % (ref 0–5)
HEMATOCRIT: 38.9 % (ref 36.0–46.0)
Hemoglobin: 12.7 g/dL (ref 12.0–15.0)
LYMPHS PCT: 24 % (ref 12–46)
Lymphs Abs: 1.6 10*3/uL (ref 0.7–4.0)
MCH: 25.5 pg — ABNORMAL LOW (ref 26.0–34.0)
MCHC: 32.6 g/dL (ref 30.0–36.0)
MCV: 78 fL (ref 78.0–100.0)
Monocytes Absolute: 0.4 10*3/uL (ref 0.1–1.0)
Monocytes Relative: 6 % (ref 3–12)
NEUTROS ABS: 4.7 10*3/uL (ref 1.7–7.7)
Neutrophils Relative %: 69 % (ref 43–77)
PLATELETS: 439 10*3/uL — AB (ref 150–400)
RBC: 4.99 MIL/uL (ref 3.87–5.11)
RDW: 16.1 % — ABNORMAL HIGH (ref 11.5–15.5)
WBC: 6.8 10*3/uL (ref 4.0–10.5)

## 2013-11-26 LAB — RENAL FUNCTION PANEL
Albumin: 4.6 g/dL (ref 3.5–5.2)
BUN: 12 mg/dL (ref 6–23)
CALCIUM: 10.1 mg/dL (ref 8.4–10.5)
CHLORIDE: 101 meq/L (ref 96–112)
CO2: 29 mEq/L (ref 19–32)
Creat: 0.9 mg/dL (ref 0.50–1.10)
Glucose, Bld: 95 mg/dL (ref 70–99)
PHOSPHORUS: 3.8 mg/dL (ref 2.3–4.6)
Potassium: 4.2 mEq/L (ref 3.5–5.3)
SODIUM: 139 meq/L (ref 135–145)

## 2013-11-26 LAB — LIPID PANEL
Cholesterol: 174 mg/dL (ref 0–200)
HDL: 46 mg/dL (ref >39)
LDL Cholesterol: 105 mg/dL — ABNORMAL HIGH (ref 0–99)
TRIGLYCERIDES: 116 mg/dL (ref <150)
Total CHOL/HDL Ratio: 3.8 Ratio
VLDL: 23 mg/dL (ref 0–40)

## 2013-11-26 LAB — HEPATIC FUNCTION PANEL
ALT: 17 U/L (ref 0–35)
AST: 16 U/L (ref 0–37)
Albumin: 4.6 g/dL (ref 3.5–5.2)
Alkaline Phosphatase: 137 U/L — ABNORMAL HIGH (ref 39–117)
BILIRUBIN DIRECT: 0.1 mg/dL (ref 0.0–0.3)
BILIRUBIN INDIRECT: 0.2 mg/dL (ref 0.2–1.2)
BILIRUBIN TOTAL: 0.3 mg/dL (ref 0.2–1.2)
TOTAL PROTEIN: 7.4 g/dL (ref 6.0–8.3)

## 2013-11-26 NOTE — Patient Instructions (Signed)
Ammonia, Plasma Ammonia This is a test to detect elevated levels of ammonia in the blood, to evaluate changes in consciousness, or to help diagnose hepatic encephalopathy and Reye's syndrome. It may be ordered when a patient experiences mental changes or lapses into a coma of unknown origin; if an infant or child experiences frequent vomiting and increased lethargy as a newborn or about a week after a viral illness. Ammonia is a compound produced by intestinal bacteria and by cells in the body during the digestion of protein. Ammonia is a waste product that the liver changes into urea and glutamine. The urea is then carried by the blood to the kidneys, where it is put out in the urine. If this "urea cycle" does not complete, ammonia builds up in the blood. This also happens when you cannot put out urine (kidney failure) or when your liver does not work (hepatic failure). A build up of ammonia in the body can cause mental and neurological changes that can lead to confusion, disorientation, sleepiness, and eventually to coma and even death. Infants and children with increased ammonia levels may vomit frequently, be irritable, and be increasingly lethargic. Left untreated, they may experience seizures, respiratory difficulty, and may lapse into a coma and die. PREPARATION FOR TEST No preparation or fasting is necessary. A blood sample is taken by a needle from a vein.  Avoid exercising before this test. NORMAL FINDINGS  Normal values depend on the method used for testing. Test results depend on many factors including age, sex, etc. Your lab report should include the specific reference range for your test. Your caregiver will go over you test results with you.  Adults: 10-80 mcg/dL (6-47 micromol/L)  Neonates, 0 to 10 days (enzymatic): 170-341 mcg/dL (100-200 micromol/L)  Infants and toddlers, 10 days to 2 years (enzymatic): 68-136 mcg/dL (40-80 micromol/L)  Children, older than 2 years (enzymatic):  19-60 mcg/dL (11-35 micromol/L) Ranges for normal findings may vary among different laboratories and hospitals. You should always check with your doctor after having lab work or other tests done to discuss the meaning of your test results and whether your values are considered within normal limits. MEANING OF TEST  Your caregiver will go over the test results with you and discuss the importance and meaning of your results, as well as treatment options and the need for additional tests if necessary. OBTAINING THE TEST RESULTS  It is your responsibility to obtain your test results. Ask the lab or department performing the test when and how you will get your results. Document Released: 08/21/2004 Document Revised: 10/22/2011 Document Reviewed: 07/05/2008 [facility] Patient Information 2014 [place], [place].

## 2013-11-26 NOTE — Progress Notes (Signed)
Pre visit review using our clinic review tool, if applicable. No additional management support is needed unless otherwise documented below in the visit note. 

## 2013-11-27 LAB — SEDIMENTATION RATE: SED RATE: 34 mm/h — AB (ref 0–22)

## 2013-11-27 LAB — AMMONIA: Ammonia: 44 umol/L (ref 16–53)

## 2013-11-27 LAB — TSH: TSH: 1.067 u[IU]/mL (ref 0.350–4.500)

## 2013-11-29 ENCOUNTER — Encounter: Payer: Self-pay | Admitting: Family Medicine

## 2013-11-29 DIAGNOSIS — L299 Pruritus, unspecified: Secondary | ICD-10-CM | POA: Insufficient documentation

## 2013-11-29 DIAGNOSIS — B372 Candidiasis of skin and nail: Secondary | ICD-10-CM

## 2013-11-29 DIAGNOSIS — L304 Erythema intertrigo: Secondary | ICD-10-CM | POA: Insufficient documentation

## 2013-11-29 DIAGNOSIS — R7689 Other specified abnormal immunological findings in serum: Secondary | ICD-10-CM | POA: Insufficient documentation

## 2013-11-29 DIAGNOSIS — R109 Unspecified abdominal pain: Secondary | ICD-10-CM

## 2013-11-29 DIAGNOSIS — R768 Other specified abnormal immunological findings in serum: Secondary | ICD-10-CM

## 2013-11-29 DIAGNOSIS — K638219 Small intestinal bacterial overgrowth, unspecified: Secondary | ICD-10-CM

## 2013-11-29 DIAGNOSIS — K6389 Other specified diseases of intestine: Secondary | ICD-10-CM

## 2013-11-29 HISTORY — DX: Candidiasis of skin and nail: B37.2

## 2013-11-29 HISTORY — DX: Other specified abnormal immunological findings in serum: R76.8

## 2013-11-29 HISTORY — DX: Unspecified abdominal pain: R10.9

## 2013-11-29 HISTORY — DX: Small intestinal bacterial overgrowth, unspecified: K63.8219

## 2013-11-29 HISTORY — DX: Other specified diseases of intestine: K63.89

## 2013-11-29 MED ORDER — RIFAXIMIN 200 MG PO TABS: 200.0000 mg | ORAL_TABLET | Freq: Three times a day (TID) | ORAL | Status: DC

## 2013-11-29 NOTE — Assessment & Plan Note (Signed)
Encouraged probiotics

## 2013-11-29 NOTE — Assessment & Plan Note (Signed)
She reports her opthamologist, Dr Darden Dates noted some cotton wool spots on her last exam

## 2013-11-29 NOTE — Assessment & Plan Note (Signed)
Is trying to decide if she is going to keep going to [facility] for her rheumatology care since her previous rheumatology MD is no longer available. She will let us knowl

## 2013-11-29 NOTE — Assessment & Plan Note (Signed)
Slow motility confirmed. Is struggling with increased frequency of stooling and discomfort s/p antibiotics. Has responded well to Xifaxan in past is given refill

## 2013-11-29 NOTE — Assessment & Plan Note (Signed)
hgba1c acceptable, minimize simple carbs. Increase exercise as tolerated. Continue current meds, managed by endocrinology

## 2013-11-29 NOTE — Assessment & Plan Note (Signed)
Well controlled, no changes to meds. Encouraged heart healthy diet such as the DASH diet and exercise as tolerated.  °

## 2013-11-29 NOTE — Assessment & Plan Note (Signed)
Ammonia normal, encouraged adequate hydration and Sarna lotion

## 2013-11-29 NOTE — Progress Notes (Signed)
Patient ID: Lori Zamora, female   DOB: 1956/02/24, 58 y.o.   MRN: [id number] Lori Zamora [id number] 1956/03/06 11/29/2013      Progress Note-Follow Up  Subjective  Chief Complaint  Chief Complaint  Patient presents with  . Follow-up    4 month  . Injections    tdap    HPI  Patient is a 58 year old female in today for routine medical care. She continues to struggle with numerous illnesses. Has chronic fatigue and chronic pain. Notes decreased visual acuity he is following with ophthalmology. Continues to follow with endocrinology reports her last hemoglobin A1c was 5.7. GI work up his review of fungal to me he has recurrent abdominal bloating, burping and nausea and occasional vomiting complaining of a pneumonia is. Notes not tolerating feeds which are causing increased cramping. We'll. Did recently have a sinus infection and required antibiotics. And reaction to her pneumonia shot.  Past Medical History  Diagnosis Date  . Unspecified sleep apnea   . Acquired acanthosis nigricans   . Unspecified vitamin D deficiency   . Bipolar disorder, unspecified   . Degeneration of intervertebral disc, site unspecified   . Other thalassemia   . Other specified iron deficiency anemias   . Esophageal reflux   . Systemic lupus erythematosus   . Unspecified constipation   . Migraine with aura, without mention of intractable migraine without mention of status migrainosus   . Anemia, unspecified   . Nontoxic multinodular goiter   . Myalgia and myositis, unspecified   . Other malaise and fatigue   . Unspecified essential hypertension   . Other and unspecified hyperlipidemia   . Type II or unspecified type diabetes mellitus without mention of complication, not stated as uncontrolled   . Depressive disorder, not elsewhere classified   . 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 Sjogren's syndrome   . CFS (chronic fatigue syndrome)   . Unspecified diffuse  connective tissue disease 01/27/2013    Follows at [facility] rheumatology, Dr Alanda Amass Per patient has tested 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 antibody  . Renal insufficiency 01/27/2013  . Pelvic floor dysfunction 01/27/2013  . Microcytic anemia 01/31/2010    Qualifier: Diagnosis of  By: Fuller Plan CMA (AAMA), Lugene    . Bipolar 2 disorder 01/31/2010    Qualifier: Diagnosis of  By: Fuller Plan CMA (AAMA), Terri Skains  Follows with Dr Letta Moynahan of psychiatry   . Intertriginous candidiasis 11/29/2013  . Small intestinal bacterial overgrowth 11/29/2013  . 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 Abdominal pain, unspecified site 11/29/2013    Past Surgical History  Procedure Laterality Date  . Laparoscopic total hysterectomy  2004    Family History  Problem Relation Age of Onset  . Diabetes Mother   . Obesity Sister   . Alcohol abuse      Family history of alcoholism and addiction  . Hypertension      family history  . Kidney disease      family history  . Stroke      1st degree relative <60  . Heart disease Mother     family history  . Allergies Mother     History   Social History  . Marital Status: Single    Spouse Name: N/A    Number of Children: 0  . Years of Education: N/A   Occupational History  . Teacher    Social History Main Topics  .  Smoking status: Never Smoker   . Smokeless tobacco: Never Used  . Alcohol Use: No  . Drug Use: No  . Sexual Activity: Not on file   Other Topics Concern  . 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 amLODipine (NORVASC) 10 MG tablet TAKE 1 TABLET BY MOUTH EVERY DAY  30 tablet  0  . atorvastatin (LIPITOR) 20 MG tablet TAKE 3 TABLETS BY MOUTH EVERY DAY AT 6 PM  90 tablet  3  . cholecalciferol (VITAMIN D) 1000 UNITS tablet Take 2,000  Units by mouth daily.       . DESONATE 0.05 % gel APPLY TOPICALLY TWICE DAILY  60 g  0  . diphenhydrAMINE (BENADRYL) 25 MG tablet Take 25 mg by mouth as needed.      Marland Kitchen glucose blood test strip 1 each by Other route daily. One Touch Ultra       . ibuprofen (ADVIL) 200 MG tablet Take 200 mg by mouth every 6 (six) hours as needed.        Marland Kitchen ketoconazole (NIZORAL) 2 % cream Apply topically as directed.      . lamoTRIgine (LAMICTAL) 100 MG tablet Take 100 mg by mouth daily.      . Lidocaine, Anorectal, 5 % CREA Apply topically as needed.      . Magnesium Oxide 500 MG (LAX) TABS Take by mouth daily. Take 1-4 tablets daily.      . metFORMIN (GLUCOPHAGE) 500 MG tablet Take 500 mg by mouth 2 (two) times daily with a meal.       . naphazoline (CLEAR EYES) 0.012 % ophthalmic solution Place 1 drop into both eyes 4 (four) times daily.        Marland Kitchen PREVACID 24HR 15 MG capsule TAKE 2 CAPSULES BY MOUTH TWICE DAILY  360 capsule  1  . RA SUNSCREEN SPF50 EX Apply topically as needed.        . sitaGLIPtan (JANUVIA) 100 MG tablet Take 100 mg by mouth daily.        . valsartan (DIOVAN) 80 MG tablet TAKE 1 TABLET BY MOUTH EVERY DAY.  30 tablet  3  . zolpidem (AMBIEN) 10 MG tablet TAKE ONE TABLET BY MOUTH AT BEDTIME AS NEEDED FOR SLEEP  90 tablet  1   No current facility-administered medications on file prior to visit.    Allergies  Allergen Reactions  . Acetazolamide   . Azathioprine   . Ezetimibe   . Latex   . Lisinopril   . Meloxicam   . Penicillins   . Pravastatin Sodium   . Simvastatin   . Sulfonamide Derivatives     REACTION: Swelling of tongue and face    Review of Systems  Review of Systems  Constitutional: Positive for malaise/fatigue. Negative for fever.  HENT: Negative for congestion.   Eyes: Negative for discharge.  Respiratory: Negative for shortness of breath.   Cardiovascular: Negative for chest pain, palpitations and leg swelling.  Gastrointestinal: Positive for heartburn, abdominal  pain and diarrhea. Negative for nausea.  Genitourinary: Negative for dysuria.  Musculoskeletal: Positive for back pain, joint pain, myalgias and neck pain. Negative for falls.  Skin: Negative for rash.  Neurological: Negative for loss of consciousness and headaches.  Endo/Heme/Allergies: Negative for polydipsia.  Psychiatric/Behavioral: Negative for depression and suicidal ideas. The patient is  not nervous/anxious and does not have insomnia.     Objective  BP 118/70  Pulse 99  Temp(Src) 98.4 F (36.9 C) (Oral)  Ht 5' 5.25" (1.657 m)  Wt 159 lb 0.6 oz (72.14 kg)  BMI 26.27 kg/m2  SpO2 99%  Physical Exam  Physical Exam  Constitutional: She is oriented to person, place, and time and well-developed, well-nourished, and in no distress. No distress.  HENT:  Head: Normocephalic and atraumatic.  Eyes: Conjunctivae are normal.  Neck: Neck supple. No thyromegaly present.  Cardiovascular: Normal rate, regular rhythm and normal heart sounds.   No murmur heard. Pulmonary/Chest: Effort normal and breath sounds normal. She has no wheezes.  Abdominal: She exhibits no distension and no mass.  Musculoskeletal: She exhibits no edema.  Lymphadenopathy:    She has no cervical adenopathy.  Neurological: She is alert and oriented to person, place, and time.  Skin: Skin is warm and dry. No rash noted. She is not diaphoretic.  Psychiatric: Memory, affect and judgment normal.    Lab Results  Component Value Date   TSH 1.067 11/26/2013   Lab Results  Component Value Date   WBC 6.8 11/26/2013   HGB 12.7 11/26/2013   HCT 38.9 11/26/2013   MCV 78.0 11/26/2013   PLT 439* 11/26/2013   Lab Results  Component Value Date   CREATININE 0.90 11/26/2013   BUN 12 11/26/2013   NA 139 11/26/2013   K 4.2 11/26/2013   CL 101 11/26/2013   CO2 29 11/26/2013   Lab Results  Component Value Date   ALT 17 11/26/2013   AST 16 11/26/2013   ALKPHOS 137* 11/26/2013   BILITOT 0.3 11/26/2013   Lab Results  Component  Value Date   CHOL 174 11/26/2013   Lab Results  Component Value Date   HDL 46 11/26/2013   Lab Results  Component Value Date   LDLCALC 105* 11/26/2013   Lab Results  Component Value Date   TRIG 116 11/26/2013   Lab Results  Component Value Date   CHOLHDL 3.8 11/26/2013     Assessment & Plan  HYPERTENSION Well controlled, no changes to meds. Encouraged heart healthy diet such as the DASH diet and exercise as tolerated.   Small intestinal bacterial overgrowth Encouraged probiotics  DIABETES MELLITUS, TYPE II hgba1c acceptable, minimize simple carbs. Increase exercise as tolerated. Continue current meds, managed by endocrinology  Abdominal pain, unspecified site Slow motility confirmed. Is struggling with increased frequency of stooling and discomfort s/p antibiotics. Has responded well to Xifaxan in past is given refill  Itching Ammonia normal, encouraged adequate hydration and Sarna lotion  LUPUS Is trying to decide if she is going to keep going to [facility] for her rheumatology care since her previous rheumatology MD is no longer available. She will let us knowl   Sjogren's syndrome She reports her opthamologist, Dr Darden Dates noted some cotton wool spots on her last exam

## 2013-12-02 ENCOUNTER — Encounter: Payer: Self-pay | Admitting: Family Medicine

## 2013-12-03 ENCOUNTER — Other Ambulatory Visit: Payer: Self-pay | Admitting: Family Medicine

## 2013-12-03 DIAGNOSIS — R197 Diarrhea, unspecified: Secondary | ICD-10-CM

## 2013-12-03 DIAGNOSIS — K6389 Other specified diseases of intestine: Secondary | ICD-10-CM

## 2013-12-03 MED ORDER — RIFAXIMIN 550 MG PO TABS: 550.0000 mg | ORAL_TABLET | Freq: Two times a day (BID) | ORAL | Status: DC

## 2013-12-09 ENCOUNTER — Telehealth: Payer: Self-pay

## 2013-12-09 NOTE — Telephone Encounter (Signed)
Relevant patient education assigned to patient using Emmi. ° °

## 2013-12-19 ENCOUNTER — Other Ambulatory Visit: Payer: Self-pay | Admitting: Family Medicine

## 2013-12-21 ENCOUNTER — Other Ambulatory Visit: Payer: Self-pay

## 2013-12-21 ENCOUNTER — Other Ambulatory Visit: Payer: Self-pay | Admitting: Family Medicine

## 2013-12-21 ENCOUNTER — Telehealth: Payer: Self-pay | Admitting: Family Medicine

## 2013-12-21 MED ORDER — VALSARTAN 80 MG PO TABS: 80.0000 mg | ORAL_TABLET | Freq: Every day | ORAL | Status: DC

## 2013-12-21 MED ORDER — ATORVASTATIN CALCIUM 20 MG PO TABS: 20.0000 mg | ORAL_TABLET | Freq: Three times a day (TID) | ORAL | Status: DC

## 2013-12-21 MED ORDER — AMLODIPINE BESYLATE 10 MG PO TABS: 10.0000 mg | ORAL_TABLET | Freq: Every day | ORAL | Status: DC

## 2013-12-21 MED ORDER — LANSOPRAZOLE 15 MG PO CPDR: 30.0000 mg | DELAYED_RELEASE_CAPSULE | Freq: Two times a day (BID) | ORAL | Status: DC

## 2013-12-21 NOTE — Telephone Encounter (Signed)
Refill-valsartan  [facility] at [address] in [place] from pharmacy: patient is requesting a 90 day supply

## 2013-12-22 ENCOUNTER — Other Ambulatory Visit: Payer: Self-pay | Admitting: Family Medicine

## 2013-12-22 ENCOUNTER — Other Ambulatory Visit: Payer: Self-pay

## 2013-12-22 MED ORDER — ATORVASTATIN CALCIUM 20 MG PO TABS: 20.0000 mg | ORAL_TABLET | Freq: Three times a day (TID) | ORAL | Status: DC

## 2013-12-22 MED ORDER — AMLODIPINE BESYLATE 10 MG PO TABS: 10.0000 mg | ORAL_TABLET | Freq: Every day | ORAL | Status: DC

## 2014-01-11 LAB — HEMOGLOBIN A1C: HEMOGLOBIN A1C: 5.6 % (ref 4.0–6.0)

## 2014-02-23 ENCOUNTER — Telehealth: Payer: Self-pay

## 2014-02-23 DIAGNOSIS — E119 Type 2 diabetes mellitus without complications: Secondary | ICD-10-CM

## 2014-02-23 NOTE — Telephone Encounter (Signed)
Patient returned phone call stating that she just had a1c checked by her endocrinologist. She will have that office fax result over

## 2014-02-23 NOTE — Telephone Encounter (Signed)
Diabetic Bundle- Left a detailed message on patients vm stating that we need here to come in at his earliest convenience for labs to be drawn for A1C.   Lab order placed

## 2014-02-24 ENCOUNTER — Encounter: Payer: Self-pay | Admitting: Family Medicine

## 2014-03-08 ENCOUNTER — Other Ambulatory Visit: Payer: Self-pay

## 2014-03-08 DIAGNOSIS — Z1231 Encounter for screening mammogram for malignant neoplasm of breast: Secondary | ICD-10-CM

## 2014-03-22 ENCOUNTER — Ambulatory Visit (INDEPENDENT_AMBULATORY_CARE_PROVIDER_SITE_OTHER): Payer: BC Managed Care – PPO | Admitting: Family Medicine

## 2014-03-22 ENCOUNTER — Encounter: Payer: Self-pay | Admitting: Family Medicine

## 2014-03-22 VITALS — BP 118/68 | HR 94 | Temp 98.7°F | Ht 65.25 in | Wt 159.1 lb

## 2014-03-22 DIAGNOSIS — E1169 Type 2 diabetes mellitus with other specified complication: Secondary | ICD-10-CM

## 2014-03-22 DIAGNOSIS — L259 Unspecified contact dermatitis, unspecified cause: Secondary | ICD-10-CM

## 2014-03-22 DIAGNOSIS — F3189 Other bipolar disorder: Secondary | ICD-10-CM

## 2014-03-22 DIAGNOSIS — E119 Type 2 diabetes mellitus without complications: Secondary | ICD-10-CM

## 2014-03-22 DIAGNOSIS — I10 Essential (primary) hypertension: Secondary | ICD-10-CM

## 2014-03-22 DIAGNOSIS — F3181 Bipolar II disorder: Secondary | ICD-10-CM

## 2014-03-22 DIAGNOSIS — L309 Dermatitis, unspecified: Secondary | ICD-10-CM

## 2014-03-22 DIAGNOSIS — G4700 Insomnia, unspecified: Secondary | ICD-10-CM

## 2014-03-22 DIAGNOSIS — B372 Candidiasis of skin and nail: Secondary | ICD-10-CM

## 2014-03-22 DIAGNOSIS — N289 Disorder of kidney and ureter, unspecified: Secondary | ICD-10-CM

## 2014-03-22 DIAGNOSIS — E785 Hyperlipidemia, unspecified: Secondary | ICD-10-CM

## 2014-03-22 DIAGNOSIS — E11620 Type 2 diabetes mellitus with diabetic dermatitis: Secondary | ICD-10-CM

## 2014-03-22 MED ORDER — KETOCONAZOLE 2 % EX CREA: 1.0000 "application " | TOPICAL_CREAM | Freq: Every day | CUTANEOUS | Status: DC

## 2014-03-22 NOTE — Assessment & Plan Note (Signed)
titrating off Lamotrigine with psych and will stop seeing them

## 2014-03-22 NOTE — Assessment & Plan Note (Signed)
Encouraged heart healthy diet, increase exercise, avoid trans fats, consider a krill oil cap daily 

## 2014-03-22 NOTE — Assessment & Plan Note (Signed)
Is trying to wean off Ambien and is at 1/2 tab and is doing well

## 2014-03-22 NOTE — Patient Instructions (Signed)

## 2014-03-22 NOTE — Assessment & Plan Note (Signed)
Well controlled, no changes to meds. Encouraged heart healthy diet such as the DASH diet and exercise as tolerated.  °

## 2014-03-22 NOTE — Assessment & Plan Note (Signed)
Mild, stable, continue same, maintain hydration

## 2014-03-22 NOTE — Progress Notes (Signed)
Pre visit review using our clinic review tool, if applicable. No additional management support is needed unless otherwise documented below in the visit note. 

## 2014-03-22 NOTE — Assessment & Plan Note (Signed)
Follows with Dr Buddy Duty, doing well, no recent change to meds, did request a urine microalb today and foot exam which were performed

## 2014-03-22 NOTE — Progress Notes (Signed)
Patient ID: Lori Zamora, female   DOB: 1955/09/15, 58 y.o.   MRN: [id number] Lori Zamora [id number] 08/14/1955 03/22/2014      Progress Note-Follow Up  Subjective  Chief Complaint  Chief Complaint  Patient presents with  . Follow-up    4 month    HPI  Patient is a 58 year old female in today for routine medical care. No recent illness. Continues to struggle with chronic fatigue, diffuse pain and dermatitis under breasts but this is adequately controlled with Nizoral but she needs a refill. She is in the process of weening off of her Ambien and Lamotrigine and has cut them in 1/2 and is tolerating this. Denies CP/palp/SOB/HA/congestion/fevers/GI or GU c/o. Taking meds as prescribed   Past Medical History  Diagnosis Date  . Unspecified sleep apnea   . Acquired acanthosis nigricans   . Unspecified vitamin D deficiency   . Bipolar disorder, unspecified   . Degeneration of intervertebral disc, site unspecified   . Other thalassemia   . Other specified iron deficiency anemias   . Esophageal reflux   . Systemic lupus erythematosus   . Unspecified constipation   . Migraine with aura, without mention of intractable migraine without mention of status migrainosus   . Anemia, unspecified   . Nontoxic multinodular goiter   . Myalgia and myositis, unspecified   . Other malaise and fatigue   . Unspecified essential hypertension   . Other and unspecified hyperlipidemia   . Type II or unspecified type diabetes mellitus without mention of complication, not stated as uncontrolled   . Depressive disorder, not elsewhere classified   . 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 Sjogren's syndrome   . CFS (chronic fatigue syndrome)   . Unspecified diffuse connective tissue disease 01/27/2013    Follows at [facility] rheumatology, Dr Alanda Amass Per patient has tested 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 antibody  . Renal  insufficiency 01/27/2013  . Pelvic floor dysfunction 01/27/2013  . Microcytic anemia 01/31/2010    Qualifier: Diagnosis of  By: Fuller Plan CMA (AAMA), Lugene    . Bipolar 2 disorder 01/31/2010    Qualifier: Diagnosis of  By: Fuller Plan CMA (AAMA), Terri Skains  Follows with Dr Letta Moynahan of psychiatry   . Intertriginous candidiasis 11/29/2013  . Small intestinal bacterial overgrowth 11/29/2013  . 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 Abdominal pain, unspecified site 11/29/2013    Past Surgical History  Procedure Laterality Date  . Laparoscopic total hysterectomy  2004    Family History  Problem Relation Age of Onset  . Diabetes Mother   . Obesity Sister   . Alcohol abuse      Family history of alcoholism and addiction  . Hypertension      family history  . Kidney disease      family history  . Stroke      1st degree relative <60  . Heart disease Mother     family history  . Allergies Mother     History   Social History  . Marital Status: Single    Spouse Name: N/A    Number of Children: 0  . Years of Education: N/A   Occupational History  . Teacher    Social History Main Topics  . Smoking status: Never Smoker   . Smokeless tobacco: Never Used  . Alcohol Use: No  . Drug Use: No  . Sexual Activity: Not on file  Other Topics Concern  . 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 amLODipine (NORVASC) 10 MG tablet Take 1 tablet (10 mg total) by mouth daily.  90 tablet  1  . atorvastatin (LIPITOR) 20 MG tablet Take 1 tablet (20 mg total) by mouth 3 (three) times daily.  270 tablet  1  . cholecalciferol (VITAMIN D) 1000 UNITS tablet Take 2,000 Units by mouth daily.       . DESONATE 0.05 % gel APPLY TOPICALLY TWICE DAILY  60 g  0  . diphenhydrAMINE (BENADRYL) 25 MG tablet Take 25 mg by mouth as needed.      Marland Kitchen  glucose blood test strip 1 each by Other route daily. One Touch Ultra       . ibuprofen (ADVIL) 200 MG tablet Take 200 mg by mouth every 6 (six) hours as needed.        . lansoprazole (PREVACID 24HR) 15 MG capsule Take 2 capsules (30 mg total) by mouth 2 (two) times daily before a meal.  360 capsule  1  . Lidocaine, Anorectal, 5 % CREA Apply topically as needed.      . Magnesium Oxide 500 MG (LAX) TABS Take by mouth daily. Take 1-4 tablets daily.      . metFORMIN (GLUCOPHAGE) 500 MG tablet Take 500 mg by mouth 2 (two) times daily with a meal.       . NON FORMULARY Lubricant eye ointment- sterile mineral oil 39.9% White Petroleum 57.7%      . NON FORMULARY Biotene dry mouth oral rinse      . PRESCRIPTION MEDICATION prevident 5000- dry mouth toothpaste      . RA SUNSCREEN SPF50 EX Apply topically as needed.        . sitaGLIPtan (JANUVIA) 100 MG tablet Take 100 mg by mouth daily.        . valsartan (DIOVAN) 80 MG tablet Take 1 tablet (80 mg total) by mouth daily.  90 tablet  1   No current facility-administered medications on file prior to visit.    Allergies  Allergen Reactions  . Acetazolamide   . Azathioprine   . Ezetimibe   . Latex   . Lisinopril   . Meloxicam   . Penicillins   . Pravastatin Sodium   . Simvastatin   . Sulfonamide Derivatives     REACTION: Swelling of tongue and face    Review of Systems  Review of Systems  Constitutional: Positive for malaise/fatigue. Negative for fever.  HENT: Negative for congestion.   Eyes: Negative for discharge.  Respiratory: Negative for shortness of breath.   Cardiovascular: Negative for chest pain, palpitations and leg swelling.  Gastrointestinal: Negative for nausea, abdominal pain and diarrhea.  Genitourinary: Negative for dysuria.  Musculoskeletal: Positive for back pain, joint pain and myalgias. Negative for falls.  Skin: Positive for rash.  Neurological: Negative for loss of consciousness and headaches.  Endo/Heme/Allergies:  Negative for polydipsia.  Psychiatric/Behavioral: Negative for depression and suicidal ideas. The patient is not nervous/anxious and does not have insomnia.     Objective  BP 118/68  Pulse 94  Temp(Src) 98.7 F (37.1 C) (Oral)  Ht 5' 5.25" (1.657 m)  Wt 159 lb 1.3 oz (72.158 kg)  BMI 26.28 kg/m2  SpO2 96%  Physical Exam  Physical Exam  Constitutional: She is  oriented to person, place, and time and well-developed, well-nourished, and in no distress. No distress.  HENT:  Head: Normocephalic and atraumatic.  Eyes: Conjunctivae are normal.  Neck: Neck supple. No thyromegaly present.  Cardiovascular: Normal rate, regular rhythm and normal heart sounds.   No murmur heard. Pulmonary/Chest: Effort normal and breath sounds normal. She has no wheezes.  Abdominal: She exhibits no distension and no mass.  Musculoskeletal: She exhibits no edema.  Lymphadenopathy:    She has no cervical adenopathy.  Neurological: She is alert and oriented to person, place, and time.  Skin: Skin is warm and dry. No rash noted. She is not diaphoretic.  Psychiatric: Memory, affect and judgment normal.    Lab Results  Component Value Date   TSH 1.067 11/26/2013   Lab Results  Component Value Date   WBC 6.8 11/26/2013   HGB 12.7 11/26/2013   HCT 38.9 11/26/2013   MCV 78.0 11/26/2013   PLT 439* 11/26/2013   Lab Results  Component Value Date   CREATININE 0.90 11/26/2013   BUN 12 11/26/2013   NA 139 11/26/2013   K 4.2 11/26/2013   CL 101 11/26/2013   CO2 29 11/26/2013   Lab Results  Component Value Date   ALT 17 11/26/2013   AST 16 11/26/2013   ALKPHOS 137* 11/26/2013   BILITOT 0.3 11/26/2013   Lab Results  Component Value Date   CHOL 174 11/26/2013   Lab Results  Component Value Date   HDL 46 11/26/2013   Lab Results  Component Value Date   LDLCALC 105* 11/26/2013   Lab Results  Component Value Date   TRIG 116 11/26/2013   Lab Results  Component Value Date   CHOLHDL 3.8 11/26/2013      Assessment & Plan  HYPERTENSION Well controlled, no changes to meds. Encouraged heart healthy diet such as the DASH diet and exercise as tolerated.   DIABETES MELLITUS, TYPE II Follows with Dr Buddy Duty, doing well, no recent change to meds, did request a urine microalb today and foot exam which were performed  Renal insufficiency Mild, stable, continue same, maintain hydration  Bipolar 2 disorder titrating off Lamotrigine with psych and will stop seeing them  Insomnia Is trying to wean off Ambien and is at 1/2 tab and is doing well  HYPERLIPIDEMIA Encouraged heart healthy diet, increase exercise, avoid trans fats, consider a krill oil cap daily  Intertriginous candidiasis Refill given on Nizoral has chronic trouble under breast is controlled with this

## 2014-03-22 NOTE — Assessment & Plan Note (Signed)
Refill given on Nizoral has chronic trouble under breast is controlled with this

## 2014-03-23 ENCOUNTER — Telehealth: Payer: Self-pay | Admitting: Family Medicine

## 2014-03-23 LAB — MICROALBUMIN / CREATININE URINE RATIO
Creatinine, Urine: 765.4 mg/dL
Microalb Creat Ratio: 5.3 mg/g (ref 0.0–30.0)
Microalb, Ur: 4.05 mg/dL — ABNORMAL HIGH (ref 0.00–1.89)

## 2014-03-23 NOTE — Telephone Encounter (Signed)
Relevant patient education assigned to patient using Emmi. ° °

## 2014-04-07 ENCOUNTER — Ambulatory Visit
Admission: RE | Admit: 2014-04-07 | Discharge: 2014-04-07 | Disposition: A | Discharge status: Home / Self Care | Payer: BC Managed Care – PPO | Source: Ambulatory Visit

## 2014-04-07 DIAGNOSIS — Z1231 Encounter for screening mammogram for malignant neoplasm of breast: Secondary | ICD-10-CM

## 2014-05-28 ENCOUNTER — Other Ambulatory Visit: Payer: Self-pay

## 2014-06-04 ENCOUNTER — Ambulatory Visit (INDEPENDENT_AMBULATORY_CARE_PROVIDER_SITE_OTHER): Payer: BC Managed Care – PPO

## 2014-06-04 DIAGNOSIS — Z23 Encounter for immunization: Secondary | ICD-10-CM

## 2014-06-16 ENCOUNTER — Other Ambulatory Visit: Payer: Self-pay | Admitting: Family Medicine

## 2014-06-16 NOTE — Telephone Encounter (Signed)
Rx's sent to the pharmacy by e-script.//AB/CMA 

## 2014-06-17 ENCOUNTER — Other Ambulatory Visit: Payer: Self-pay

## 2014-06-17 MED ORDER — AMLODIPINE BESYLATE 10 MG PO TABS: 10.0000 mg | ORAL_TABLET | Freq: Every day | ORAL | Status: DC

## 2014-07-05 ENCOUNTER — Other Ambulatory Visit: Payer: Self-pay | Admitting: Family Medicine

## 2014-07-28 LAB — HM DIABETES EYE EXAM

## 2014-08-10 ENCOUNTER — Encounter: Payer: Self-pay | Admitting: Family Medicine

## 2014-08-23 ENCOUNTER — Encounter: Payer: Self-pay | Admitting: Family Medicine

## 2014-08-23 ENCOUNTER — Other Ambulatory Visit: Payer: Self-pay | Admitting: Family Medicine

## 2014-08-23 DIAGNOSIS — E559 Vitamin D deficiency, unspecified: Secondary | ICD-10-CM

## 2014-08-23 DIAGNOSIS — E1169 Type 2 diabetes mellitus with other specified complication: Secondary | ICD-10-CM

## 2014-08-23 DIAGNOSIS — E669 Obesity, unspecified: Secondary | ICD-10-CM

## 2014-08-23 DIAGNOSIS — IMO0001 Reserved for inherently not codable concepts without codable children: Secondary | ICD-10-CM

## 2014-09-06 ENCOUNTER — Ambulatory Visit (INDEPENDENT_AMBULATORY_CARE_PROVIDER_SITE_OTHER): Payer: BC Managed Care – PPO | Admitting: Family Medicine

## 2014-09-06 ENCOUNTER — Encounter: Payer: Self-pay | Admitting: Family Medicine

## 2014-09-06 VITALS — BP 107/70 | HR 90 | Temp 98.2°F | Ht 65.5 in | Wt 170.8 lb

## 2014-09-06 DIAGNOSIS — K5900 Constipation, unspecified: Secondary | ICD-10-CM

## 2014-09-06 DIAGNOSIS — M329 Systemic lupus erythematosus, unspecified: Secondary | ICD-10-CM

## 2014-09-06 DIAGNOSIS — M359 Systemic involvement of connective tissue, unspecified: Secondary | ICD-10-CM

## 2014-09-06 DIAGNOSIS — R7989 Other specified abnormal findings of blood chemistry: Secondary | ICD-10-CM

## 2014-09-06 DIAGNOSIS — I10 Essential (primary) hypertension: Secondary | ICD-10-CM

## 2014-09-06 DIAGNOSIS — E785 Hyperlipidemia, unspecified: Secondary | ICD-10-CM

## 2014-09-06 DIAGNOSIS — E782 Mixed hyperlipidemia: Secondary | ICD-10-CM

## 2014-09-06 DIAGNOSIS — E119 Type 2 diabetes mellitus without complications: Secondary | ICD-10-CM

## 2014-09-06 DIAGNOSIS — F3181 Bipolar II disorder: Secondary | ICD-10-CM

## 2014-09-06 DIAGNOSIS — Z0000 Encounter for general adult medical examination without abnormal findings: Secondary | ICD-10-CM

## 2014-09-06 DIAGNOSIS — G4700 Insomnia, unspecified: Secondary | ICD-10-CM

## 2014-09-06 LAB — LIPID PANEL
CHOLESTEROL: 164 mg/dL (ref 0–200)
HDL: 45.9 mg/dL (ref >39.00)
LDL Cholesterol: 96 mg/dL (ref 0–99)
NonHDL: 118.1
TRIGLYCERIDES: 113 mg/dL (ref 0.0–149.0)
Total CHOL/HDL Ratio: 4
VLDL: 22.6 mg/dL (ref 0.0–40.0)

## 2014-09-06 LAB — COMPREHENSIVE METABOLIC PANEL
ALT: 16 U/L (ref 0–35)
AST: 15 U/L (ref 0–37)
Albumin: 4.4 g/dL (ref 3.5–5.2)
Alkaline Phosphatase: 130 U/L — ABNORMAL HIGH (ref 39–117)
BUN: 9 mg/dL (ref 6–23)
CO2: 27 meq/L (ref 19–32)
CREATININE: 0.78 mg/dL (ref 0.40–1.20)
Calcium: 9.9 mg/dL (ref 8.4–10.5)
Chloride: 103 mEq/L (ref 96–112)
GFR: 97.69 mL/min (ref >60.00)
Glucose, Bld: 85 mg/dL (ref 70–99)
Potassium: 3.4 mEq/L — ABNORMAL LOW (ref 3.5–5.1)
Sodium: 140 mEq/L (ref 135–145)
TOTAL PROTEIN: 7.7 g/dL (ref 6.0–8.3)
Total Bilirubin: 0.3 mg/dL (ref 0.2–1.2)

## 2014-09-06 LAB — CBC
HCT: 40.6 % (ref 36.0–46.0)
HEMOGLOBIN: 13 g/dL (ref 12.0–15.0)
MCHC: 32.1 g/dL (ref 30.0–36.0)
MCV: 79.4 fl (ref 78.0–100.0)
Platelets: 416 10*3/uL — ABNORMAL HIGH (ref 150.0–400.0)
RBC: 5.11 Mil/uL (ref 3.87–5.11)
RDW: 16.3 % — AB (ref 11.5–15.5)
WBC: 8.5 10*3/uL (ref 4.0–10.5)

## 2014-09-06 LAB — VITAMIN D 25 HYDROXY (VIT D DEFICIENCY, FRACTURES): VITD: 47.15 ng/mL (ref 30.00–100.00)

## 2014-09-06 LAB — SEDIMENTATION RATE: Sed Rate: 46 mm/hr — ABNORMAL HIGH (ref 0–22)

## 2014-09-06 NOTE — Patient Instructions (Addendum)
[employer] or [facility] probiotics daily  Preventive Care for Adults A healthy lifestyle and preventive care can promote health and wellness. Preventive health guidelines for women include the following key practices.  A routine yearly physical is a good way to check with your health care provider about your health and preventive screening. It is a chance to share any concerns and updates on your health and to receive a thorough exam.  Visit your dentist for a routine exam and preventive care every 6 months. Brush your teeth twice a day and floss once a day. Good oral hygiene prevents tooth decay and gum disease.  The frequency of eye exams is based on your age, health, family medical history, use of contact lenses, and other factors. Follow your health care provider's recommendations for frequency of eye exams.  Eat a healthy diet. Foods like vegetables, fruits, whole grains, low-fat dairy products, and lean protein foods contain the nutrients you need without too many calories. Decrease your intake of foods high in solid fats, added sugars, and salt. Eat the right amount of calories for you.Get information about a proper diet from your health care provider, if necessary.  Regular physical exercise is one of the most important things you can do for your health. Most adults should get at least 150 minutes of moderate-intensity exercise (any activity that increases your heart rate and causes you to sweat) each week. In addition, most adults need muscle-strengthening exercises on 2 or more days a week.  Maintain a healthy weight. The body mass index (BMI) is a screening tool to identify possible weight problems. It provides an estimate of body fat based on height and weight. Your health care provider can find your BMI and can help you achieve or maintain a healthy weight.For adults 20 years and older:  A BMI below 18.5 is considered underweight.  A BMI of 18.5 to 24.9 is  normal.  A BMI of 25 to 29.9 is considered overweight.  A BMI of 30 and above is considered obese.  Maintain normal blood lipids and cholesterol levels by exercising and minimizing your intake of saturated fat. Eat a balanced diet with plenty of fruit and vegetables. Blood tests for lipids and cholesterol should begin at age 8 and be repeated every 5 years. If your lipid or cholesterol levels are high, you are over 50, or you are at high risk for heart disease, you may need your cholesterol levels checked more frequently.Ongoing high lipid and cholesterol levels should be treated with medicines if diet and exercise are not working.  If you smoke, find out from your health care provider how to quit. If you do not use tobacco, do not start.  Lung cancer screening is recommended for adults aged 60-80 years who are at high risk for developing lung cancer because of a history of smoking. A yearly low-dose CT scan of the lungs is recommended for people who have at least a 30-pack-year history of smoking and are a current smoker or have quit within the past 15 years. A pack year of smoking is smoking an average of 1 pack of cigarettes a day for 1 year (for example: 1 pack a day for 30 years or 2 packs a day for 15 years). Yearly screening should continue until the smoker has stopped smoking for at least 15 years. Yearly screening should be stopped for people who develop a health problem that would prevent them from having lung cancer treatment.  If you are  pregnant, do not drink alcohol. If you are breastfeeding, be very cautious about drinking alcohol. If you are not pregnant and choose to drink alcohol, do not have more than 1 drink per day. One drink is considered to be 12 ounces (355 mL) of beer, 5 ounces (148 mL) of wine, or 1.5 ounces (44 mL) of liquor.  Avoid use of street drugs. Do not share needles with anyone. Ask for help if you need support or instructions about stopping the use of  drugs.  High blood pressure causes heart disease and increases the risk of stroke. Your blood pressure should be checked at least every 1 to 2 years. Ongoing high blood pressure should be treated with medicines if weight loss and exercise do not work.  If you are 34-32 years old, ask your health care provider if you should take aspirin to prevent strokes.  Diabetes screening involves taking a blood sample to check your fasting blood sugar level. This should be done once every 3 years, after age 6, if you are within normal weight and without risk factors for diabetes. Testing should be considered at a younger age or be carried out more frequently if you are overweight and have at least 1 risk factor for diabetes.  Breast cancer screening is essential preventive care for women. You should practice "breast self-awareness." This means understanding the normal appearance and feel of your breasts and may include breast self-examination. Any changes detected, no matter how small, should be reported to a health care provider. Women in their 58s and 30s should have a clinical breast exam (CBE) by a health care provider as part of a regular health exam every 1 to 3 years. After age 21, women should have a CBE every year. Starting at age 17, women should consider having a mammogram (breast X-ray test) every year. Women who have a family history of breast cancer should talk to their health care provider about genetic screening. Women at a high risk of breast cancer should talk to their health care providers about having an MRI and a mammogram every year.  Breast cancer gene (BRCA)-related cancer risk assessment is recommended for women who have family members with BRCA-related cancers. BRCA-related cancers include breast, ovarian, tubal, and peritoneal cancers. Having family members with these cancers may be associated with an increased risk for harmful changes (mutations) in the breast cancer genes BRCA1 and BRCA2.  Results of the assessment will determine the need for genetic counseling and BRCA1 and BRCA2 testing.  Routine pelvic exams to screen for cancer are no longer recommended for nonpregnant women who are considered low risk for cancer of the pelvic organs (ovaries, uterus, and vagina) and who do not have symptoms. Ask your health care provider if a screening pelvic exam is right for you.  If you have had past treatment for cervical cancer or a condition that could lead to cancer, you need Pap tests and screening for cancer for at least 20 years after your treatment. If Pap tests have been discontinued, your risk factors (such as having a new sexual partner) need to be reassessed to determine if screening should be resumed. Some women have medical problems that increase the chance of getting cervical cancer. In these cases, your health care provider may recommend more frequent screening and Pap tests.  The HPV test is an additional test that may be used for cervical cancer screening. The HPV test looks for the virus that can cause the cell changes on the cervix.  The cells collected during the Pap test can be tested for HPV. The HPV test could be used to screen women aged 78 years and older, and should be used in women of any age who have unclear Pap test results. After the age of 62, women should have HPV testing at the same frequency as a Pap test.  Colorectal cancer can be detected and often prevented. Most routine colorectal cancer screening begins at the age of 23 years and continues through age 40 years. However, your health care provider may recommend screening at an earlier age if you have risk factors for colon cancer. On a yearly basis, your health care provider may provide home test kits to check for hidden blood in the stool. Use of a small camera at the end of a tube, to directly examine the colon (sigmoidoscopy or colonoscopy), can detect the earliest forms of colorectal cancer. Talk to your health  care provider about this at age 11, when routine screening begins. Direct exam of the colon should be repeated every 5-10 years through age 25 years, unless early forms of pre-cancerous polyps or small growths are found.  People who are at an increased risk for hepatitis B should be screened for this virus. You are considered at high risk for hepatitis B if:  You were born in a country where hepatitis B occurs often. Talk with your health care provider about which countries are considered high risk.  Your parents were born in a high-risk country and you have not received a shot to protect against hepatitis B (hepatitis B vaccine).  You have HIV or AIDS.  You use needles to inject street drugs.  You live with, or have sex with, someone who has hepatitis B.  You get hemodialysis treatment.  You take certain medicines for conditions like cancer, organ transplantation, and autoimmune conditions.  Hepatitis C blood testing is recommended for all people born from [birth year] through 1965 and any individual with known risks for hepatitis C.  Practice safe sex. Use condoms and avoid high-risk sexual practices to reduce the spread of sexually transmitted infections (STIs). STIs include gonorrhea, chlamydia, syphilis, trichomonas, herpes, HPV, and human immunodeficiency virus (HIV). Herpes, HIV, and HPV are viral illnesses that have no cure. They can result in disability, cancer, and death.  You should be screened for sexually transmitted illnesses (STIs) including gonorrhea and chlamydia if:  You are sexually active and are younger than 24 years.  You are older than 24 years and your health care provider tells you that you are at risk for this type of infection.  Your sexual activity has changed since you were last screened and you are at an increased risk for chlamydia or gonorrhea. Ask your health care provider if you are at risk.  If you are at risk of being infected with HIV, it is recommended  that you take a prescription medicine daily to prevent HIV infection. This is called preexposure prophylaxis (PrEP). You are considered at risk if:  You are a heterosexual woman, are sexually active, and are at increased risk for HIV infection.  You take drugs by injection.  You are sexually active with a partner who has HIV.  Talk with your health care provider about whether you are at high risk of being infected with HIV. If you choose to begin PrEP, you should first be tested for HIV. You should then be tested every 3 months for as long as you are taking PrEP.  Osteoporosis is a  disease in which the bones lose minerals and strength with aging. This can result in serious bone fractures or breaks. The risk of osteoporosis can be identified using a bone density scan. Women ages 56 years and over and women at risk for fractures or osteoporosis should discuss screening with their health care providers. Ask your health care provider whether you should take a calcium supplement or vitamin D to reduce the rate of osteoporosis.  Menopause can be associated with physical symptoms and risks. Hormone replacement therapy is available to decrease symptoms and risks. You should talk to your health care provider about whether hormone replacement therapy is right for you.  Use sunscreen. Apply sunscreen liberally and repeatedly throughout the day. You should seek shade when your shadow is shorter than you. Protect yourself by wearing long sleeves, pants, a wide-brimmed hat, and sunglasses year round, whenever you are outdoors.  Once a month, do a whole body skin exam, using a mirror to look at the skin on your back. Tell your health care provider of new moles, moles that have irregular borders, moles that are larger than a pencil eraser, or moles that have changed in shape or color.  Stay current with required vaccines (immunizations).  Influenza vaccine. All adults should be immunized every year.  Tetanus,  diphtheria, and acellular pertussis (Td, Tdap) vaccine. Pregnant women should receive 1 dose of Tdap vaccine during each pregnancy. The dose should be obtained regardless of the length of time since the last dose. Immunization is preferred during the 27th-36th week of gestation. An adult who has not previously received Tdap or who does not know her vaccine status should receive 1 dose of Tdap. This initial dose should be followed by tetanus and diphtheria toxoids (Td) booster doses every 10 years. Adults with an unknown or incomplete history of completing a 3-dose immunization series with Td-containing vaccines should begin or complete a primary immunization series including a Tdap dose. Adults should receive a Td booster every 10 years.  Varicella vaccine. An adult without evidence of immunity to varicella should receive 2 doses or a second dose if she has previously received 1 dose. Pregnant females who do not have evidence of immunity should receive the first dose after pregnancy. This first dose should be obtained before leaving the health care facility. The second dose should be obtained 4-8 weeks after the first dose.  Human papillomavirus (HPV) vaccine. Females aged 13-26 years who have not received the vaccine previously should obtain the 3-dose series. The vaccine is not recommended for use in pregnant females. However, pregnancy testing is not needed before receiving a dose. If a female is found to be pregnant after receiving a dose, no treatment is needed. In that case, the remaining doses should be delayed until after the pregnancy. Immunization is recommended for any person with an immunocompromised condition through the age of 78 years if she did not get any or all doses earlier. During the 3-dose series, the second dose should be obtained 4-8 weeks after the first dose. The third dose should be obtained 24 weeks after the first dose and 16 weeks after the second dose.  Zoster vaccine. One dose  is recommended for adults aged 40 years or older unless certain conditions are present.  Measles, mumps, and rubella (MMR) vaccine. Adults born before [birth year] generally are considered immune to measles and mumps. Adults born in [birth year] or later should have 1 or more doses of MMR vaccine unless there is a contraindication to the  vaccine or there is laboratory evidence of immunity to each of the three diseases. A routine second dose of MMR vaccine should be obtained at least 28 days after the first dose for students attending postsecondary schools, health care workers, or international travelers. People who received inactivated measles vaccine or an unknown type of measles vaccine during 1963-1967 should receive 2 doses of MMR vaccine. People who received inactivated mumps vaccine or an unknown type of mumps vaccine before 1979 and are at high risk for mumps infection should consider immunization with 2 doses of MMR vaccine. For females of childbearing age, rubella immunity should be determined. If there is no evidence of immunity, females who are not pregnant should be vaccinated. If there is no evidence of immunity, females who are pregnant should delay immunization until after pregnancy. Unvaccinated health care workers born before [birth year] who lack laboratory evidence of measles, mumps, or rubella immunity or laboratory confirmation of disease should consider measles and mumps immunization with 2 doses of MMR vaccine or rubella immunization with 1 dose of MMR vaccine.  Pneumococcal 13-valent conjugate (PCV13) vaccine. When indicated, a person who is uncertain of her immunization history and has no record of immunization should receive the PCV13 vaccine. An adult aged 90 years or older who has certain medical conditions and has not been previously immunized should receive 1 dose of PCV13 vaccine. This PCV13 should be followed with a dose of pneumococcal polysaccharide (PPSV23) vaccine. The PPSV23 vaccine dose should be  obtained at least 8 weeks after the dose of PCV13 vaccine. An adult aged 62 years or older who has certain medical conditions and previously received 1 or more doses of PPSV23 vaccine should receive 1 dose of PCV13. The PCV13 vaccine dose should be obtained 1 or more years after the last PPSV23 vaccine dose.  Pneumococcal polysaccharide (PPSV23) vaccine. When PCV13 is also indicated, PCV13 should be obtained first. All adults aged 34 years and older should be immunized. An adult younger than age 56 years who has certain medical conditions should be immunized. Any person who resides in a nursing home or long-term care facility should be immunized. An adult smoker should be immunized. People with an immunocompromised condition and certain other conditions should receive both PCV13 and PPSV23 vaccines. People with human immunodeficiency virus (HIV) infection should be immunized as soon as possible after diagnosis. Immunization during chemotherapy or radiation therapy should be avoided. Routine use of PPSV23 vaccine is not recommended for American Indians, [place] Natives, or people younger than 65 years unless there are medical conditions that require PPSV23 vaccine. When indicated, people who have unknown immunization and have no record of immunization should receive PPSV23 vaccine. One-time revaccination 5 years after the first dose of PPSV23 is recommended for people aged 19-64 years who have chronic kidney failure, nephrotic syndrome, asplenia, or immunocompromised conditions. People who received 1-2 doses of PPSV23 before age 23 years should receive another dose of PPSV23 vaccine at age 60 years or later if at least 5 years have passed since the previous dose. Doses of PPSV23 are not needed for people immunized with PPSV23 at or after age 13 years.  Meningococcal vaccine. Adults with asplenia or persistent complement component deficiencies should receive 2 doses of quadrivalent meningococcal conjugate  (MenACWY-D) vaccine. The doses should be obtained at least 2 months apart. Microbiologists working with certain meningococcal bacteria, [employer] recruits, people at risk during an outbreak, and people who travel to or live in countries with a high rate of meningitis should  be immunized. A first-year college student up through age 8 years who is living in a residence hall should receive a dose if she did not receive a dose on or after her 16th birthday. Adults who have certain high-risk conditions should receive one or more doses of vaccine.  Hepatitis A vaccine. Adults who wish to be protected from this disease, have certain high-risk conditions, work with hepatitis A-infected animals, work in hepatitis A research labs, or travel to or work in countries with a high rate of hepatitis A should be immunized. Adults who were previously unvaccinated and who anticipate close contact with an international adoptee during the first 60 days after arrival in the [place] from a country with a high rate of hepatitis A should be immunized.  Hepatitis B vaccine. Adults who wish to be protected from this disease, have certain high-risk conditions, may be exposed to blood or other infectious body fluids, are household contacts or sex partners of hepatitis B positive people, are clients or workers in certain care facilities, or travel to or work in countries with a high rate of hepatitis B should be immunized.  Haemophilus influenzae type b (Hib) vaccine. A previously unvaccinated person with asplenia or sickle cell disease or having a scheduled splenectomy should receive 1 dose of Hib vaccine. Regardless of previous immunization, a recipient of a hematopoietic stem cell transplant should receive a 3-dose series 6-12 months after her successful transplant. Hib vaccine is not recommended for adults with HIV infection. Preventive Services / Frequency Ages 35 to 60 years  Blood pressure check.** / Every 1 to 2  years.  Lipid and cholesterol check.** / Every 5 years beginning at age 42.  Clinical breast exam.** / Every 3 years for women in their 40s and 44s.  BRCA-related cancer risk assessment.** / For women who have family members with a BRCA-related cancer (breast, ovarian, tubal, or peritoneal cancers).  Pap test.** / Every 2 years from ages 44 through 63. Every 3 years starting at age 20 through age 68 or 16 with a history of 3 consecutive normal Pap tests.  HPV screening.** / Every 3 years from ages 43 through ages 62 to 43 with a history of 3 consecutive normal Pap tests.  Hepatitis C blood test.** / For any individual with known risks for hepatitis C.  Skin self-exam. / Monthly.  Influenza vaccine. / Every year.  Tetanus, diphtheria, and acellular pertussis (Tdap, Td) vaccine.** / Consult your health care provider. Pregnant women should receive 1 dose of Tdap vaccine during each pregnancy. 1 dose of Td every 10 years.  Varicella vaccine.** / Consult your health care provider. Pregnant females who do not have evidence of immunity should receive the first dose after pregnancy.  HPV vaccine. / 3 doses over 6 months, if 37 and younger. The vaccine is not recommended for use in pregnant females. However, pregnancy testing is not needed before receiving a dose.  Measles, mumps, rubella (MMR) vaccine.** / You need at least 1 dose of MMR if you were born in 1957 or later. You may also need a 2nd dose. For females of childbearing age, rubella immunity should be determined. If there is no evidence of immunity, females who are not pregnant should be vaccinated. If there is no evidence of immunity, females who are pregnant should delay immunization until after pregnancy.  Pneumococcal 13-valent conjugate (PCV13) vaccine.** / Consult your health care provider.  Pneumococcal polysaccharide (PPSV23) vaccine.** / 1 to 2 doses if you smoke cigarettes  or if you have certain conditions.  Meningococcal  vaccine.** / 1 dose if you are age 76 to 78 years and a Market researcher living in a residence hall, or have one of several medical conditions, you need to get vaccinated against meningococcal disease. You may also need additional booster doses.  Hepatitis A vaccine.** / Consult your health care provider.  Hepatitis B vaccine.** / Consult your health care provider.  Haemophilus influenzae type b (Hib) vaccine.** / Consult your health care provider. Ages 10 to 27 years  Blood pressure check.** / Every 1 to 2 years.  Lipid and cholesterol check.** / Every 5 years beginning at age 34 years.  Lung cancer screening. / Every year if you are aged 86-80 years and have a 30-pack-year history of smoking and currently smoke or have quit within the past 15 years. Yearly screening is stopped once you have quit smoking for at least 15 years or develop a health problem that would prevent you from having lung cancer treatment.  Clinical breast exam.** / Every year after age 18 years.  BRCA-related cancer risk assessment.** / For women who have family members with a BRCA-related cancer (breast, ovarian, tubal, or peritoneal cancers).  Mammogram.** / Every year beginning at age 42 years and continuing for as long as you are in good health. Consult with your health care provider.  Pap test.** / Every 3 years starting at age 63 years through age 61 or 93 years with a history of 3 consecutive normal Pap tests.  HPV screening.** / Every 3 years from ages 104 years through ages 37 to 70 years with a history of 3 consecutive normal Pap tests.  Fecal occult blood test (FOBT) of stool. / Every year beginning at age 108 years and continuing until age 40 years. You may not need to do this test if you get a colonoscopy every 10 years.  Flexible sigmoidoscopy or colonoscopy.** / Every 5 years for a flexible sigmoidoscopy or every 10 years for a colonoscopy beginning at age 85 years and continuing until age 6  years.  Hepatitis C blood test.** / For all people born from [birth year] through 1965 and any individual with known risks for hepatitis C.  Skin self-exam. / Monthly.  Influenza vaccine. / Every year.  Tetanus, diphtheria, and acellular pertussis (Tdap/Td) vaccine.** / Consult your health care provider. Pregnant women should receive 1 dose of Tdap vaccine during each pregnancy. 1 dose of Td every 10 years.  Varicella vaccine.** / Consult your health care provider. Pregnant females who do not have evidence of immunity should receive the first dose after pregnancy.  Zoster vaccine.** / 1 dose for adults aged 75 years or older.  Measles, mumps, rubella (MMR) vaccine.** / You need at least 1 dose of MMR if you were born in 1957 or later. You may also need a 2nd dose. For females of childbearing age, rubella immunity should be determined. If there is no evidence of immunity, females who are not pregnant should be vaccinated. If there is no evidence of immunity, females who are pregnant should delay immunization until after pregnancy.  Pneumococcal 13-valent conjugate (PCV13) vaccine.** / Consult your health care provider.  Pneumococcal polysaccharide (PPSV23) vaccine.** / 1 to 2 doses if you smoke cigarettes or if you have certain conditions.  Meningococcal vaccine.** / Consult your health care provider.  Hepatitis A vaccine.** / Consult your health care provider.  Hepatitis B vaccine.** / Consult your health care provider.  Haemophilus influenzae type b (  Hib) vaccine.** / Consult your health care provider. Ages 24 years and over  Blood pressure check.** / Every 1 to 2 years.  Lipid and cholesterol check.** / Every 5 years beginning at age 20 years.  Lung cancer screening. / Every year if you are aged 31-80 years and have a 30-pack-year history of smoking and currently smoke or have quit within the past 15 years. Yearly screening is stopped once you have quit smoking for at least 15 years or  develop a health problem that would prevent you from having lung cancer treatment.  Clinical breast exam.** / Every year after age 68 years.  BRCA-related cancer risk assessment.** / For women who have family members with a BRCA-related cancer (breast, ovarian, tubal, or peritoneal cancers).  Mammogram.** / Every year beginning at age 59 years and continuing for as long as you are in good health. Consult with your health care provider.  Pap test.** / Every 3 years starting at age 73 years through age 48 or 33 years with 3 consecutive normal Pap tests. Testing can be stopped between 65 and 70 years with 3 consecutive normal Pap tests and no abnormal Pap or HPV tests in the past 10 years.  HPV screening.** / Every 3 years from ages 43 years through ages 4 or 87 years with a history of 3 consecutive normal Pap tests. Testing can be stopped between 65 and 70 years with 3 consecutive normal Pap tests and no abnormal Pap or HPV tests in the past 10 years.  Fecal occult blood test (FOBT) of stool. / Every year beginning at age 35 years and continuing until age 38 years. You may not need to do this test if you get a colonoscopy every 10 years.  Flexible sigmoidoscopy or colonoscopy.** / Every 5 years for a flexible sigmoidoscopy or every 10 years for a colonoscopy beginning at age 69 years and continuing until age 58 years.  Hepatitis C blood test.** / For all people born from [birth year] through 1964 and any individual with known risks for hepatitis C.  Osteoporosis screening.** / A one-time screening for women ages 60 years and over and women at risk for fractures or osteoporosis.  Skin self-exam. / Monthly.  Influenza vaccine. / Every year.  Tetanus, diphtheria, and acellular pertussis (Tdap/Td) vaccine.** / 1 dose of Td every 10 years.  Varicella vaccine.** / Consult your health care provider.  Zoster vaccine.** / 1 dose for adults aged 62 years or older.  Pneumococcal 13-valent conjugate  (PCV13) vaccine.** / Consult your health care provider.  Pneumococcal polysaccharide (PPSV23) vaccine.** / 1 dose for all adults aged 67 years and older.  Meningococcal vaccine.** / Consult your health care provider.  Hepatitis A vaccine.** / Consult your health care provider.  Hepatitis B vaccine.** / Consult your health care provider.  Haemophilus influenzae type b (Hib) vaccine.** / Consult your health care provider. ** Family history and personal history of risk and conditions may change your health care provider's recommendations. Document Released: 09/25/2001 Document Revised: 12/14/2013 Document Reviewed: 12/25/2010 [facility] Patient Information 2015 [place], [place]. This information is not intended to replace advice given to you by your health care provider. Make sure you discuss any questions you have with your health care provider. Preventive Care for Adults A healthy lifestyle and preventive care can promote health and wellness. Preventive health guidelines for women include the following key practices.  A routine yearly physical is a good way to check with your health care provider about your health  and preventive screening. It is a chance to share any concerns and updates on your health and to receive a thorough exam.  Visit your dentist for a routine exam and preventive care every 6 months. Brush your teeth twice a day and floss once a day. Good oral hygiene prevents tooth decay and gum disease.  The frequency of eye exams is based on your age, health, family medical history, use of contact lenses, and other factors. Follow your health care provider's recommendations for frequency of eye exams.  Eat a healthy diet. Foods like vegetables, fruits, whole grains, low-fat dairy products, and lean protein foods contain the nutrients you need without too many calories. Decrease your intake of foods high in solid fats, added sugars, and salt. Eat the right amount of calories for you.Get  information about a proper diet from your health care provider, if necessary.  Regular physical exercise is one of the most important things you can do for your health. Most adults should get at least 150 minutes of moderate-intensity exercise (any activity that increases your heart rate and causes you to sweat) each week. In addition, most adults need muscle-strengthening exercises on 2 or more days a week.  Maintain a healthy weight. The body mass index (BMI) is a screening tool to identify possible weight problems. It provides an estimate of body fat based on height and weight. Your health care provider can find your BMI and can help you achieve or maintain a healthy weight.For adults 20 years and older:  A BMI below 18.5 is considered underweight.  A BMI of 18.5 to 24.9 is normal.  A BMI of 25 to 29.9 is considered overweight.  A BMI of 30 and above is considered obese.  Maintain normal blood lipids and cholesterol levels by exercising and minimizing your intake of saturated fat. Eat a balanced diet with plenty of fruit and vegetables. Blood tests for lipids and cholesterol should begin at age 25 and be repeated every 5 years. If your lipid or cholesterol levels are high, you are over 50, or you are at high risk for heart disease, you may need your cholesterol levels checked more frequently.Ongoing high lipid and cholesterol levels should be treated with medicines if diet and exercise are not working.  If you smoke, find out from your health care provider how to quit. If you do not use tobacco, do not start.  Lung cancer screening is recommended for adults aged 55-80 years who are at high risk for developing lung cancer because of a history of smoking. A yearly low-dose CT scan of the lungs is recommended for people who have at least a 30-pack-year history of smoking and are a current smoker or have quit within the past 15 years. A pack year of smoking is smoking an average of 1 pack of  cigarettes a day for 1 year (for example: 1 pack a day for 30 years or 2 packs a day for 15 years). Yearly screening should continue until the smoker has stopped smoking for at least 15 years. Yearly screening should be stopped for people who develop a health problem that would prevent them from having lung cancer treatment.  If you are pregnant, do not drink alcohol. If you are breastfeeding, be very cautious about drinking alcohol. If you are not pregnant and choose to drink alcohol, do not have more than 1 drink per day. One drink is considered to be 12 ounces (355 mL) of beer, 5 ounces (148 mL) of wine,  or 1.5 ounces (44 mL) of liquor.  Avoid use of street drugs. Do not share needles with anyone. Ask for help if you need support or instructions about stopping the use of drugs.  High blood pressure causes heart disease and increases the risk of stroke. Your blood pressure should be checked at least every 1 to 2 years. Ongoing high blood pressure should be treated with medicines if weight loss and exercise do not work.  If you are 86-53 years old, ask your health care provider if you should take aspirin to prevent strokes.  Diabetes screening involves taking a blood sample to check your fasting blood sugar level. This should be done once every 3 years, after age 75, if you are within normal weight and without risk factors for diabetes. Testing should be considered at a younger age or be carried out more frequently if you are overweight and have at least 1 risk factor for diabetes.  Breast cancer screening is essential preventive care for women. You should practice "breast self-awareness." This means understanding the normal appearance and feel of your breasts and may include breast self-examination. Any changes detected, no matter how small, should be reported to a health care provider. Women in their 34s and 30s should have a clinical breast exam (CBE) by a health care provider as part of a regular  health exam every 1 to 3 years. After age 5, women should have a CBE every year. Starting at age 42, women should consider having a mammogram (breast X-ray test) every year. Women who have a family history of breast cancer should talk to their health care provider about genetic screening. Women at a high risk of breast cancer should talk to their health care providers about having an MRI and a mammogram every year.  Breast cancer gene (BRCA)-related cancer risk assessment is recommended for women who have family members with BRCA-related cancers. BRCA-related cancers include breast, ovarian, tubal, and peritoneal cancers. Having family members with these cancers may be associated with an increased risk for harmful changes (mutations) in the breast cancer genes BRCA1 and BRCA2. Results of the assessment will determine the need for genetic counseling and BRCA1 and BRCA2 testing.  Routine pelvic exams to screen for cancer are no longer recommended for nonpregnant women who are considered low risk for cancer of the pelvic organs (ovaries, uterus, and vagina) and who do not have symptoms. Ask your health care provider if a screening pelvic exam is right for you.  If you have had past treatment for cervical cancer or a condition that could lead to cancer, you need Pap tests and screening for cancer for at least 20 years after your treatment. If Pap tests have been discontinued, your risk factors (such as having a new sexual partner) need to be reassessed to determine if screening should be resumed. Some women have medical problems that increase the chance of getting cervical cancer. In these cases, your health care provider may recommend more frequent screening and Pap tests.  The HPV test is an additional test that may be used for cervical cancer screening. The HPV test looks for the virus that can cause the cell changes on the cervix. The cells collected during the Pap test can be tested for HPV. The HPV test  could be used to screen women aged 24 years and older, and should be used in women of any age who have unclear Pap test results. After the age of 31, women should have HPV testing at  the same frequency as a Pap test.  Colorectal cancer can be detected and often prevented. Most routine colorectal cancer screening begins at the age of 34 years and continues through age 10 years. However, your health care provider may recommend screening at an earlier age if you have risk factors for colon cancer. On a yearly basis, your health care provider may provide home test kits to check for hidden blood in the stool. Use of a small camera at the end of a tube, to directly examine the colon (sigmoidoscopy or colonoscopy), can detect the earliest forms of colorectal cancer. Talk to your health care provider about this at age 5, when routine screening begins. Direct exam of the colon should be repeated every 5-10 years through age 50 years, unless early forms of pre-cancerous polyps or small growths are found.  People who are at an increased risk for hepatitis B should be screened for this virus. You are considered at high risk for hepatitis B if:  You were born in a country where hepatitis B occurs often. Talk with your health care provider about which countries are considered high risk.  Your parents were born in a high-risk country and you have not received a shot to protect against hepatitis B (hepatitis B vaccine).  You have HIV or AIDS.  You use needles to inject street drugs.  You live with, or have sex with, someone who has hepatitis B.  You get hemodialysis treatment.  You take certain medicines for conditions like cancer, organ transplantation, and autoimmune conditions.  Hepatitis C blood testing is recommended for all people born from [birth year] through 1965 and any individual with known risks for hepatitis C.  Practice safe sex. Use condoms and avoid high-risk sexual practices to reduce the spread of  sexually transmitted infections (STIs). STIs include gonorrhea, chlamydia, syphilis, trichomonas, herpes, HPV, and human immunodeficiency virus (HIV). Herpes, HIV, and HPV are viral illnesses that have no cure. They can result in disability, cancer, and death.  You should be screened for sexually transmitted illnesses (STIs) including gonorrhea and chlamydia if:  You are sexually active and are younger than 24 years.  You are older than 24 years and your health care provider tells you that you are at risk for this type of infection.  Your sexual activity has changed since you were last screened and you are at an increased risk for chlamydia or gonorrhea. Ask your health care provider if you are at risk.  If you are at risk of being infected with HIV, it is recommended that you take a prescription medicine daily to prevent HIV infection. This is called preexposure prophylaxis (PrEP). You are considered at risk if:  You are a heterosexual woman, are sexually active, and are at increased risk for HIV infection.  You take drugs by injection.  You are sexually active with a partner who has HIV.  Talk with your health care provider about whether you are at high risk of being infected with HIV. If you choose to begin PrEP, you should first be tested for HIV. You should then be tested every 3 months for as long as you are taking PrEP.  Osteoporosis is a disease in which the bones lose minerals and strength with aging. This can result in serious bone fractures or breaks. The risk of osteoporosis can be identified using a bone density scan. Women ages 52 years and over and women at risk for fractures or osteoporosis should discuss screening with their health care  providers. Ask your health care provider whether you should take a calcium supplement or vitamin D to reduce the rate of osteoporosis.  Menopause can be associated with physical symptoms and risks. Hormone replacement therapy is available to  decrease symptoms and risks. You should talk to your health care provider about whether hormone replacement therapy is right for you.  Use sunscreen. Apply sunscreen liberally and repeatedly throughout the day. You should seek shade when your shadow is shorter than you. Protect yourself by wearing long sleeves, pants, a wide-brimmed hat, and sunglasses year round, whenever you are outdoors.  Once a month, do a whole body skin exam, using a mirror to look at the skin on your back. Tell your health care provider of new moles, moles that have irregular borders, moles that are larger than a pencil eraser, or moles that have changed in shape or color.  Stay current with required vaccines (immunizations).  Influenza vaccine. All adults should be immunized every year.  Tetanus, diphtheria, and acellular pertussis (Td, Tdap) vaccine. Pregnant women should receive 1 dose of Tdap vaccine during each pregnancy. The dose should be obtained regardless of the length of time since the last dose. Immunization is preferred during the 27th-36th week of gestation. An adult who has not previously received Tdap or who does not know her vaccine status should receive 1 dose of Tdap. This initial dose should be followed by tetanus and diphtheria toxoids (Td) booster doses every 10 years. Adults with an unknown or incomplete history of completing a 3-dose immunization series with Td-containing vaccines should begin or complete a primary immunization series including a Tdap dose. Adults should receive a Td booster every 10 years.  Varicella vaccine. An adult without evidence of immunity to varicella should receive 2 doses or a second dose if she has previously received 1 dose. Pregnant females who do not have evidence of immunity should receive the first dose after pregnancy. This first dose should be obtained before leaving the health care facility. The second dose should be obtained 4-8 weeks after the first dose.  Human  papillomavirus (HPV) vaccine. Females aged 13-26 years who have not received the vaccine previously should obtain the 3-dose series. The vaccine is not recommended for use in pregnant females. However, pregnancy testing is not needed before receiving a dose. If a female is found to be pregnant after receiving a dose, no treatment is needed. In that case, the remaining doses should be delayed until after the pregnancy. Immunization is recommended for any person with an immunocompromised condition through the age of 58 years if she did not get any or all doses earlier. During the 3-dose series, the second dose should be obtained 4-8 weeks after the first dose. The third dose should be obtained 24 weeks after the first dose and 16 weeks after the second dose.  Zoster vaccine. One dose is recommended for adults aged 87 years or older unless certain conditions are present.  Measles, mumps, and rubella (MMR) vaccine. Adults born before 18 generally are considered immune to measles and mumps. Adults born in [birth year] or later should have 1 or more doses of MMR vaccine unless there is a contraindication to the vaccine or there is laboratory evidence of immunity to each of the three diseases. A routine second dose of MMR vaccine should be obtained at least 28 days after the first dose for students attending postsecondary schools, health care workers, or international travelers. People who received inactivated measles vaccine or an unknown type  of measles vaccine during 1963-1967 should receive 2 doses of MMR vaccine. People who received inactivated mumps vaccine or an unknown type of mumps vaccine before 1979 and are at high risk for mumps infection should consider immunization with 2 doses of MMR vaccine. For females of childbearing age, rubella immunity should be determined. If there is no evidence of immunity, females who are not pregnant should be vaccinated. If there is no evidence of immunity, females who are pregnant  should delay immunization until after pregnancy. Unvaccinated health care workers born before [birth year] who lack laboratory evidence of measles, mumps, or rubella immunity or laboratory confirmation of disease should consider measles and mumps immunization with 2 doses of MMR vaccine or rubella immunization with 1 dose of MMR vaccine.  Pneumococcal 13-valent conjugate (PCV13) vaccine. When indicated, a person who is uncertain of her immunization history and has no record of immunization should receive the PCV13 vaccine. An adult aged 72 years or older who has certain medical conditions and has not been previously immunized should receive 1 dose of PCV13 vaccine. This PCV13 should be followed with a dose of pneumococcal polysaccharide (PPSV23) vaccine. The PPSV23 vaccine dose should be obtained at least 8 weeks after the dose of PCV13 vaccine. An adult aged 57 years or older who has certain medical conditions and previously received 1 or more doses of PPSV23 vaccine should receive 1 dose of PCV13. The PCV13 vaccine dose should be obtained 1 or more years after the last PPSV23 vaccine dose.  Pneumococcal polysaccharide (PPSV23) vaccine. When PCV13 is also indicated, PCV13 should be obtained first. All adults aged 79 years and older should be immunized. An adult younger than age 36 years who has certain medical conditions should be immunized. Any person who resides in a nursing home or long-term care facility should be immunized. An adult smoker should be immunized. People with an immunocompromised condition and certain other conditions should receive both PCV13 and PPSV23 vaccines. People with human immunodeficiency virus (HIV) infection should be immunized as soon as possible after diagnosis. Immunization during chemotherapy or radiation therapy should be avoided. Routine use of PPSV23 vaccine is not recommended for American Indians, [place] Natives, or people younger than 65 years unless there are medical conditions  that require PPSV23 vaccine. When indicated, people who have unknown immunization and have no record of immunization should receive PPSV23 vaccine. One-time revaccination 5 years after the first dose of PPSV23 is recommended for people aged 19-64 years who have chronic kidney failure, nephrotic syndrome, asplenia, or immunocompromised conditions. People who received 1-2 doses of PPSV23 before age 33 years should receive another dose of PPSV23 vaccine at age 38 years or later if at least 5 years have passed since the previous dose. Doses of PPSV23 are not needed for people immunized with PPSV23 at or after age 45 years.  Meningococcal vaccine. Adults with asplenia or persistent complement component deficiencies should receive 2 doses of quadrivalent meningococcal conjugate (MenACWY-D) vaccine. The doses should be obtained at least 2 months apart. Microbiologists working with certain meningococcal bacteria, [place] recruits, people at risk during an outbreak, and people who travel to or live in countries with a high rate of meningitis should be immunized. A first-year college student up through age 11 years who is living in a residence hall should receive a dose if she did not receive a dose on or after her 16th birthday. Adults who have certain high-risk conditions should receive one or more doses of vaccine.  Hepatitis A vaccine.  Adults who wish to be protected from this disease, have certain high-risk conditions, work with hepatitis A-infected animals, work in hepatitis A research labs, or travel to or work in countries with a high rate of hepatitis A should be immunized. Adults who were previously unvaccinated and who anticipate close contact with an international adoptee during the first 60 days after arrival in the [place] from a country with a high rate of hepatitis A should be immunized.  Hepatitis B vaccine. Adults who wish to be protected from this disease, have certain high-risk conditions, may  be exposed to blood or other infectious body fluids, are household contacts or sex partners of hepatitis B positive people, are clients or workers in certain care facilities, or travel to or work in countries with a high rate of hepatitis B should be immunized.  Haemophilus influenzae type b (Hib) vaccine. A previously unvaccinated person with asplenia or sickle cell disease or having a scheduled splenectomy should receive 1 dose of Hib vaccine. Regardless of previous immunization, a recipient of a hematopoietic stem cell transplant should receive a 3-dose series 6-12 months after her successful transplant. Hib vaccine is not recommended for adults with HIV infection. Preventive Services / Frequency Ages 20 to 83 years  Blood pressure check.** / Every 1 to 2 years.  Lipid and cholesterol check.** / Every 5 years beginning at age 32.  Clinical breast exam.** / Every 3 years for women in their 99s and 32s.  BRCA-related cancer risk assessment.** / For women who have family members with a BRCA-related cancer (breast, ovarian, tubal, or peritoneal cancers).  Pap test.** / Every 2 years from ages 25 through 82. Every 3 years starting at age 46 through age 43 or 23 with a history of 3 consecutive normal Pap tests.  HPV screening.** / Every 3 years from ages 65 through ages 52 to 64 with a history of 3 consecutive normal Pap tests.  Hepatitis C blood test.** / For any individual with known risks for hepatitis C.  Skin self-exam. / Monthly.  Influenza vaccine. / Every year.  Tetanus, diphtheria, and acellular pertussis (Tdap, Td) vaccine.** / Consult your health care provider. Pregnant women should receive 1 dose of Tdap vaccine during each pregnancy. 1 dose of Td every 10 years.  Varicella vaccine.** / Consult your health care provider. Pregnant females who do not have evidence of immunity should receive the first dose after pregnancy.  HPV vaccine. / 3 doses over 6 months, if 10 and younger.  The vaccine is not recommended for use in pregnant females. However, pregnancy testing is not needed before receiving a dose.  Measles, mumps, rubella (MMR) vaccine.** / You need at least 1 dose of MMR if you were born in 1957 or later. You may also need a 2nd dose. For females of childbearing age, rubella immunity should be determined. If there is no evidence of immunity, females who are not pregnant should be vaccinated. If there is no evidence of immunity, females who are pregnant should delay immunization until after pregnancy.  Pneumococcal 13-valent conjugate (PCV13) vaccine.** / Consult your health care provider.  Pneumococcal polysaccharide (PPSV23) vaccine.** / 1 to 2 doses if you smoke cigarettes or if you have certain conditions.  Meningococcal vaccine.** / 1 dose if you are age 32 to 52 years and a Market researcher living in a residence hall, or have one of several medical conditions, you need to get vaccinated against meningococcal disease. You may also need additional booster doses.  Hepatitis A vaccine.** / Consult your health care provider.  Hepatitis B vaccine.** / Consult your health care provider.  Haemophilus influenzae type b (Hib) vaccine.** / Consult your health care provider. Ages 65 to 64 years  Blood pressure check.** / Every 1 to 2 years.  Lipid and cholesterol check.** / Every 5 years beginning at age 35 years.  Lung cancer screening. / Every year if you are aged 55-80 years and have a 30-pack-year history of smoking and currently smoke or have quit within the past 15 years. Yearly screening is stopped once you have quit smoking for at least 15 years or develop a health problem that would prevent you from having lung cancer treatment.  Clinical breast exam.** / Every year after age 78 years.  BRCA-related cancer risk assessment.** / For women who have family members with a BRCA-related cancer (breast, ovarian, tubal, or peritoneal  cancers).  Mammogram.** / Every year beginning at age 4 years and continuing for as long as you are in good health. Consult with your health care provider.  Pap test.** / Every 3 years starting at age 11 years through age 30 or 38 years with a history of 3 consecutive normal Pap tests.  HPV screening.** / Every 3 years from ages 56 years through ages 14 to 74 years with a history of 3 consecutive normal Pap tests.  Fecal occult blood test (FOBT) of stool. / Every year beginning at age 55 years and continuing until age 89 years. You may not need to do this test if you get a colonoscopy every 10 years.  Flexible sigmoidoscopy or colonoscopy.** / Every 5 years for a flexible sigmoidoscopy or every 10 years for a colonoscopy beginning at age 83 years and continuing until age 46 years.  Hepatitis C blood test.** / For all people born from [birth year] through 1964 and any individual with known risks for hepatitis C.  Skin self-exam. / Monthly.  Influenza vaccine. / Every year.  Tetanus, diphtheria, and acellular pertussis (Tdap/Td) vaccine.** / Consult your health care provider. Pregnant women should receive 1 dose of Tdap vaccine during each pregnancy. 1 dose of Td every 10 years.  Varicella vaccine.** / Consult your health care provider. Pregnant females who do not have evidence of immunity should receive the first dose after pregnancy.  Zoster vaccine.** / 1 dose for adults aged 11 years or older.  Measles, mumps, rubella (MMR) vaccine.** / You need at least 1 dose of MMR if you were born in 1957 or later. You may also need a 2nd dose. For females of childbearing age, rubella immunity should be determined. If there is no evidence of immunity, females who are not pregnant should be vaccinated. If there is no evidence of immunity, females who are pregnant should delay immunization until after pregnancy.  Pneumococcal 13-valent conjugate (PCV13) vaccine.** / Consult your health care  provider.  Pneumococcal polysaccharide (PPSV23) vaccine.** / 1 to 2 doses if you smoke cigarettes or if you have certain conditions.  Meningococcal vaccine.** / Consult your health care provider.  Hepatitis A vaccine.** / Consult your health care provider.  Hepatitis B vaccine.** / Consult your health care provider.  Haemophilus influenzae type b (Hib) vaccine.** / Consult your health care provider. Ages 65 years and over  Blood pressure check.** / Every 1 to 2 years.  Lipid and cholesterol check.** / Every 5 years beginning at age 32 years.  Lung cancer screening. / Every year if you are aged 55-80 years and have a  30-pack-year history of smoking and currently smoke or have quit within the past 15 years. Yearly screening is stopped once you have quit smoking for at least 15 years or develop a health problem that would prevent you from having lung cancer treatment.  Clinical breast exam.** / Every year after age 68 years.  BRCA-related cancer risk assessment.** / For women who have family members with a BRCA-related cancer (breast, ovarian, tubal, or peritoneal cancers).  Mammogram.** / Every year beginning at age 10 years and continuing for as long as you are in good health. Consult with your health care provider.  Pap test.** / Every 3 years starting at age 58 years through age 57 or 32 years with 3 consecutive normal Pap tests. Testing can be stopped between 65 and 70 years with 3 consecutive normal Pap tests and no abnormal Pap or HPV tests in the past 10 years.  HPV screening.** / Every 3 years from ages 56 years through ages 71 or 24 years with a history of 3 consecutive normal Pap tests. Testing can be stopped between 65 and 70 years with 3 consecutive normal Pap tests and no abnormal Pap or HPV tests in the past 10 years.  Fecal occult blood test (FOBT) of stool. / Every year beginning at age 25 years and continuing until age 34 years. You may not need to do this test if you get  a colonoscopy every 10 years.  Flexible sigmoidoscopy or colonoscopy.** / Every 5 years for a flexible sigmoidoscopy or every 10 years for a colonoscopy beginning at age 66 years and continuing until age 65 years.  Hepatitis C blood test.** / For all people born from [birth year] through 1964 and any individual with known risks for hepatitis C.  Osteoporosis screening.** / A one-time screening for women ages 26 years and over and women at risk for fractures or osteoporosis.  Skin self-exam. / Monthly.  Influenza vaccine. / Every year.  Tetanus, diphtheria, and acellular pertussis (Tdap/Td) vaccine.** / 1 dose of Td every 10 years.  Varicella vaccine.** / Consult your health care provider.  Zoster vaccine.** / 1 dose for adults aged 46 years or older.  Pneumococcal 13-valent conjugate (PCV13) vaccine.** / Consult your health care provider.  Pneumococcal polysaccharide (PPSV23) vaccine.** / 1 dose for all adults aged 38 years and older.  Meningococcal vaccine.** / Consult your health care provider.  Hepatitis A vaccine.** / Consult your health care provider.  Hepatitis B vaccine.** / Consult your health care provider.  Haemophilus influenzae type b (Hib) vaccine.** / Consult your health care provider. ** Family history and personal history of risk and conditions may change your health care provider's recommendations. Document Released: 09/25/2001 Document Revised: 12/14/2013 Document Reviewed: 12/25/2010 [facility] Patient Information 2015 [place], [place]. This information is not intended to replace advice given to you by your health care provider. Make sure you discuss any questions you have with your health care provider.

## 2014-09-06 NOTE — Progress Notes (Signed)
Lori Zamora  [id number] 1956-01-23 09/06/2014      Progress Note-Follow Up  Subjective  Chief Complaint  Chief Complaint  Patient presents with  . Annual Exam    fasting-no pap smear    HPI  Patient is a 59 y.o. female in today for routine medical care. No recent illness. Continues to struggle with chronic pain and fatigue but is managing most days. Follows with gastroenterology at [facility], Dr Damita Lack of hepatology. Is noting a recent episode of blisters on chest and abdomen but they have resolved at this time. Has not been taking any lamictal or ambien and has been sleeping well and feeling well. Follows with endocrinology and last hgba1c was 6.1. No polyuria or polydipsia. Denies CP/palp/SOB/HA/congestion/fevers/GI or GU c/o. Taking meds as prescribed  Past Medical History  Diagnosis Date  . Unspecified sleep apnea   . Acquired acanthosis nigricans   . Unspecified vitamin D deficiency   . Bipolar disorder, unspecified   . Degeneration of intervertebral disc, site unspecified   . Other thalassemia   . Other specified iron deficiency anemias   . Esophageal reflux   . Systemic lupus erythematosus   . Unspecified constipation   . Migraine with aura, without mention of intractable migraine without mention of status migrainosus   . Anemia, unspecified   . Nontoxic multinodular goiter   . Myalgia and myositis, unspecified   . Other malaise and fatigue   . Unspecified essential hypertension   . Other and unspecified hyperlipidemia   . Type II or unspecified type diabetes mellitus without mention of complication, not stated as uncontrolled   . Depressive disorder, not elsewhere classified   . 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 Sjogren's syndrome   . CFS (chronic fatigue syndrome)   . 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 Renal insufficiency 01/27/2013  . Pelvic floor dysfunction 01/27/2013  . Microcytic anemia 01/31/2010    Qualifier: Diagnosis of  By: Fuller Plan CMA (AAMA), [place]    . Bipolar 2 disorder 01/31/2010    Qualifier: Diagnosis of  By: Fuller Plan CMA (AAMA), Terri Skains  Follows with Dr Letta Moynahan of psychiatry   . Intertriginous candidiasis 11/29/2013  . Small intestinal bacterial overgrowth 11/29/2013  . 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 Abdominal pain, unspecified site 11/29/2013    Past Surgical History  Procedure Laterality Date  . Laparoscopic total hysterectomy  2004    Family History  Problem Relation Age of Onset  . Diabetes Mother   . Obesity Sister   . Alcohol abuse      Family history of alcoholism and addiction  . Hypertension      family history  . Kidney disease      family history  . Stroke      1st degree relative <60  . Heart disease Mother     family history  . Allergies Mother     History   Social History  . Marital Status: Single    Spouse Name: N/A    Number of Children: 0  . Years of Education: N/A   Occupational History  . Teacher    Social History Main Topics  . Smoking status: Never Smoker   . Smokeless tobacco: Never Used  . Alcohol Use: No  . Drug Use: No  .  Sexual Activity: Not on file   Other Topics Concern  . 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 amLODipine (NORVASC) 10 MG tablet Take 1 tablet (10 mg total) by mouth daily. 90 tablet 1  . atorvastatin (LIPITOR) 20 MG tablet TAKE 1 TABLET BY MOUTH THREE TIMES DAILY 270 tablet 0  . cholecalciferol (VITAMIN D) 1000 UNITS tablet Take 2,000 Units by mouth daily.     . DESONATE 0.05 % gel APPLY TOPICALLY TWICE DAILY 60 g 0  . diphenhydrAMINE (BENADRYL) 25 MG tablet  Take 25 mg by mouth as needed.    Marland Kitchen glucose blood test strip 1 each by Other route daily. One Touch Ultra     . ibuprofen (ADVIL) 200 MG tablet Take 200 mg by mouth every 6 (six) hours as needed.      Marland Kitchen ketoconazole (NIZORAL) 2 % cream Apply 1 application topically daily. 60 g 2  . Lidocaine, Anorectal, 5 % CREA Apply topically as needed.    . Magnesium Oxide 500 MG (LAX) TABS Take by mouth daily. Take 1-4 tablets daily.    . metFORMIN (GLUCOPHAGE) 500 MG tablet Take 500 mg by mouth 2 (two) times daily with a meal.     . NON FORMULARY Lubricant eye ointment- sterile mineral oil 39.9% White Petroleum 57.7%    . NON FORMULARY Biotene dry mouth oral rinse    . Polyethyl Glycol-Propyl Glycol (SYSTANE) 0.4-0.3 % GEL Apply to eye.    Marland Kitchen PRESCRIPTION MEDICATION prevident 5000- dry mouth toothpaste    . PREVACID 24HR 15 MG capsule TAKE 2 CAPSULES BY MOUTH TWICE DAILY BEFORE A MEAL 360 capsule 0  . RA SUNSCREEN SPF50 EX Apply topically as needed.      . sitaGLIPtan (JANUVIA) 100 MG tablet Take 100 mg by mouth daily.      . valsartan (DIOVAN) 80 MG tablet TAKE 1 TABLET BY MOUTH EVERY DAY 90 tablet 0   No current facility-administered medications on file prior to visit.    Allergies  Allergen Reactions  . Acetazolamide   . Azathioprine   . Ezetimibe   . Latex   . Lisinopril   . Meloxicam   . Penicillins   . Pravastatin Sodium   . Simvastatin   . Sulfonamide Derivatives     REACTION: Swelling of tongue and face    Review of Systems  Review of Systems  Constitutional: Negative for fever, chills and malaise/fatigue.  HENT: Negative for congestion, hearing loss and nosebleeds.   Eyes: Negative for discharge.  Respiratory: Negative for cough, sputum production, shortness of breath and wheezing.   Cardiovascular: Negative for chest pain, palpitations and leg swelling.  Gastrointestinal: Negative for heartburn, nausea, vomiting, abdominal pain, diarrhea, constipation and blood in stool.    Genitourinary: Negative for dysuria, urgency, frequency and hematuria.  Musculoskeletal: Positive for joint pain. Negative for myalgias, back pain and falls.       B/l shin pain no pattern of activity level, shoes etc. Has not tried any treatments, none today. Describes as achy  Skin: Positive for itching and rash.       Face with lupus and recurring blister in LLQ  Neurological: Negative for dizziness, tremors, sensory change, focal weakness, loss of consciousness, weakness and headaches.  Endo/Heme/Allergies: Negative for polydipsia. Does not bruise/bleed easily.  Psychiatric/Behavioral: Negative for  depression and suicidal ideas. The patient is not nervous/anxious and does not have insomnia.     Objective  BP 107/70 mmHg  Pulse 90  Temp(Src) 98.2 F (36.8 C) (Oral)  Ht 5' 5.5" (1.664 m)  Wt 170 lb 12.8 oz (77.474 kg)  BMI 27.98 kg/m2  SpO2 98%  Physical Exam  Physical Exam  Constitutional: She is oriented to person, place, and time and well-developed, well-nourished, and in no distress. No distress.  HENT:  Head: Normocephalic and atraumatic.  Right Ear: External ear normal.  Left Ear: External ear normal.  Nose: Nose normal.  Mouth/Throat: Oropharynx is clear and moist. No oropharyngeal exudate.  Eyes: Conjunctivae are normal. Pupils are equal, round, and reactive to light. Right eye exhibits no discharge. Left eye exhibits no discharge. No scleral icterus.  Neck: Normal range of motion. Neck supple. No thyromegaly present.  Cardiovascular: Normal rate, regular rhythm, normal heart sounds and intact distal pulses.   No murmur heard. Pulmonary/Chest: Effort normal and breath sounds normal. No respiratory distress. She has no wheezes. She has no rales.  Abdominal: Soft. Bowel sounds are normal. She exhibits no distension and no mass. There is no tenderness.  Musculoskeletal: Normal range of motion. She exhibits no edema or tenderness.  Lymphadenopathy:    She has no  cervical adenopathy.  Neurological: She is alert and oriented to person, place, and time. She has normal reflexes. No cranial nerve deficit. Coordination normal.  Skin: Skin is warm and dry. No rash noted. She is not diaphoretic.  Psychiatric: Mood, memory and affect normal.    Lab Results  Component Value Date   TSH 1.067 11/26/2013   Lab Results  Component Value Date   WBC 6.8 11/26/2013   HGB 12.7 11/26/2013   HCT 38.9 11/26/2013   MCV 78.0 11/26/2013   PLT 439* 11/26/2013   Lab Results  Component Value Date   CREATININE 0.90 11/26/2013   BUN 12 11/26/2013   NA 139 11/26/2013   K 4.2 11/26/2013   CL 101 11/26/2013   CO2 29 11/26/2013   Lab Results  Component Value Date   ALT 17 11/26/2013   AST 16 11/26/2013   ALKPHOS 137* 11/26/2013   BILITOT 0.3 11/26/2013   Lab Results  Component Value Date   CHOL 174 11/26/2013   Lab Results  Component Value Date   HDL 46 11/26/2013   Lab Results  Component Value Date   LDLCALC 105* 11/26/2013   Lab Results  Component Value Date   TRIG 116 11/26/2013   Lab Results  Component Value Date   CHOLHDL 3.8 11/26/2013     Assessment & Plan  Essential hypertension Well controlled, no changes to meds. Encouraged heart healthy diet such as the DASH diet and exercise as tolerated.    Constipation Encouraged increased hydration and fiber in diet. Daily probiotics. If bowels not moving can use MOM 2 tbls po in 4 oz of warm prune juice by mouth every 2-3 days. If no results then repeat in 4 hours with  Dulcolax suppository pr, may repeat again in 4 more hours as needed. Seek care if symptoms worsen. Consider daily Miralax and/or Dulcolax if symptoms persist.    Diabetes mellitus type 2 in nonobese hgba1c acceptable, minimize simple carbs. Increase exercise as tolerated. Continue current meds. Follows with Dr Buddy Duty, endocrinology   Hyperlipidemia, mixed Tolerating statin, encouraged heart healthy diet, avoid trans fats,  minimize simple carbs and saturated fats. Increase exercise as tolerated   Diffuse connective  tissue disease Has followed with [facility], stable at present but sed rate noted to be elevated. Symptoms being managed fairly well by patient at present time.   Bipolar 2 disorder Has stopped Lamictal and is doing well without it, unless something changes chooses not to follow up with psychiatry unless something changes   Insomnia Not needing Ambien, doing well. Encouraged good sleep hygiene such as dark, quiet room. No blue/green glowing lights such as computer screens in bedroom. No alcohol or stimulants in evening. Cut down on caffeine as able. Regular exercise is helpful but not just prior to bed time.    Preventative health care Patient encouraged to maintain heart healthy diet, regular exercise, adequate sleep. Consider daily probiotics. Take medications as prescribed. Follows with GYN for paps and mammograms

## 2014-09-06 NOTE — Progress Notes (Signed)
Pre visit review using our clinic review tool, if applicable. No additional management support is needed unless otherwise documented below in the visit note. 

## 2014-09-06 NOTE — Assessment & Plan Note (Signed)
Well controlled, no changes to meds. Encouraged heart healthy diet such as the DASH diet and exercise as tolerated.  °

## 2014-09-12 ENCOUNTER — Encounter: Payer: Self-pay | Admitting: Family Medicine

## 2014-09-12 DIAGNOSIS — Z0000 Encounter for general adult medical examination without abnormal findings: Secondary | ICD-10-CM

## 2014-09-12 DIAGNOSIS — Z1211 Encounter for screening for malignant neoplasm of colon: Secondary | ICD-10-CM | POA: Insufficient documentation

## 2014-09-12 HISTORY — DX: Encounter for general adult medical examination without abnormal findings: Z00.00

## 2014-09-12 NOTE — Assessment & Plan Note (Signed)
Encouraged increased hydration and fiber in diet. Daily probiotics. If bowels not moving can use MOM 2 tbls po in 4 oz of warm prune juice by mouth every 2-3 days. If no results then repeat in 4 hours with  Dulcolax suppository pr, may repeat again in 4 more hours as needed. Seek care if symptoms worsen. Consider daily Miralax and/or Dulcolax if symptoms persist.  

## 2014-09-12 NOTE — Assessment & Plan Note (Signed)
Tolerating statin, encouraged heart healthy diet, avoid trans fats, minimize simple carbs and saturated fats. Increase exercise as tolerated 

## 2014-09-12 NOTE — Assessment & Plan Note (Signed)
Has followed with [facility], stable at present but sed rate noted to be elevated. Symptoms being managed fairly well by patient at present time.

## 2014-09-12 NOTE — Assessment & Plan Note (Signed)
hgba1c acceptable, minimize simple carbs. Increase exercise as tolerated. Continue current meds. Follows with Dr Buddy Duty, endocrinology

## 2014-09-12 NOTE — Assessment & Plan Note (Signed)
Patient encouraged to maintain heart healthy diet, regular exercise, adequate sleep. Consider daily probiotics. Take medications as prescribed. Follows with GYN for paps and mammograms

## 2014-09-12 NOTE — Assessment & Plan Note (Signed)
Not needing Ambien, doing well. Encouraged good sleep hygiene such as dark, quiet room. No blue/green glowing lights such as computer screens in bedroom. No alcohol or stimulants in evening. Cut down on caffeine as able. Regular exercise is helpful but not just prior to bed time.

## 2014-09-12 NOTE — Assessment & Plan Note (Signed)
Has stopped Lamictal and is doing well without it, unless something changes chooses not to follow up with psychiatry unless something changes

## 2014-09-15 ENCOUNTER — Other Ambulatory Visit: Payer: Self-pay | Admitting: Family Medicine

## 2014-09-15 NOTE — Telephone Encounter (Signed)
Rx request to pharmacy/[facility]  

## 2014-09-28 ENCOUNTER — Other Ambulatory Visit: Payer: Self-pay | Admitting: Family Medicine

## 2014-12-10 ENCOUNTER — Other Ambulatory Visit: Payer: Self-pay | Admitting: Internal Medicine

## 2014-12-10 DIAGNOSIS — E042 Nontoxic multinodular goiter: Secondary | ICD-10-CM

## 2014-12-13 ENCOUNTER — Other Ambulatory Visit: Payer: Self-pay | Admitting: Family Medicine

## 2014-12-16 ENCOUNTER — Ambulatory Visit
Admission: RE | Admit: 2014-12-16 | Discharge: 2014-12-16 | Disposition: A | Discharge status: Home / Self Care | Payer: BC Managed Care – PPO | Source: Ambulatory Visit | Attending: Internal Medicine | Admitting: Internal Medicine

## 2014-12-16 DIAGNOSIS — E042 Nontoxic multinodular goiter: Secondary | ICD-10-CM

## 2014-12-30 ENCOUNTER — Encounter: Payer: Self-pay | Admitting: Family Medicine

## 2014-12-30 ENCOUNTER — Ambulatory Visit (INDEPENDENT_AMBULATORY_CARE_PROVIDER_SITE_OTHER): Payer: BC Managed Care – PPO | Admitting: Family Medicine

## 2014-12-30 ENCOUNTER — Telehealth: Payer: Self-pay | Admitting: Family Medicine

## 2014-12-30 ENCOUNTER — Other Ambulatory Visit: Payer: Self-pay | Admitting: Internal Medicine

## 2014-12-30 VITALS — BP 104/68 | HR 109 | Temp 98.7°F | Ht 65.5 in | Wt 175.1 lb

## 2014-12-30 DIAGNOSIS — I10 Essential (primary) hypertension: Secondary | ICD-10-CM

## 2014-12-30 DIAGNOSIS — L309 Dermatitis, unspecified: Secondary | ICD-10-CM

## 2014-12-30 DIAGNOSIS — E119 Type 2 diabetes mellitus without complications: Secondary | ICD-10-CM

## 2014-12-30 DIAGNOSIS — E041 Nontoxic single thyroid nodule: Secondary | ICD-10-CM

## 2014-12-30 DIAGNOSIS — K111 Hypertrophy of salivary gland: Secondary | ICD-10-CM | POA: Diagnosis not present

## 2014-12-30 DIAGNOSIS — E042 Nontoxic multinodular goiter: Secondary | ICD-10-CM

## 2014-12-30 DIAGNOSIS — E559 Vitamin D deficiency, unspecified: Secondary | ICD-10-CM

## 2014-12-30 DIAGNOSIS — K5900 Constipation, unspecified: Secondary | ICD-10-CM | POA: Diagnosis not present

## 2014-12-30 DIAGNOSIS — D508 Other iron deficiency anemias: Secondary | ICD-10-CM

## 2014-12-30 LAB — COMPREHENSIVE METABOLIC PANEL
ALT: 19 U/L (ref 0–35)
AST: 15 U/L (ref 0–37)
Albumin: 4.3 g/dL (ref 3.5–5.2)
Alkaline Phosphatase: 123 U/L — ABNORMAL HIGH (ref 39–117)
BUN: 15 mg/dL (ref 6–23)
CHLORIDE: 103 meq/L (ref 96–112)
CO2: 28 meq/L (ref 19–32)
CREATININE: 0.88 mg/dL (ref 0.40–1.20)
Calcium: 10 mg/dL (ref 8.4–10.5)
GFR: 84.9 mL/min (ref >60.00)
Glucose, Bld: 105 mg/dL — ABNORMAL HIGH (ref 70–99)
Potassium: 3.8 mEq/L (ref 3.5–5.1)
Sodium: 139 mEq/L (ref 135–145)
Total Bilirubin: 0.4 mg/dL (ref 0.2–1.2)
Total Protein: 7.7 g/dL (ref 6.0–8.3)

## 2014-12-30 LAB — MAGNESIUM: MAGNESIUM: 2.1 mg/dL (ref 1.5–2.5)

## 2014-12-30 LAB — VITAMIN D 25 HYDROXY (VIT D DEFICIENCY, FRACTURES): VITD: 45.78 ng/mL (ref 30.00–100.00)

## 2014-12-30 MED ORDER — LANSOPRAZOLE 15 MG PO CPDR: DELAYED_RELEASE_CAPSULE | ORAL | Status: DC

## 2014-12-30 MED ORDER — ATORVASTATIN CALCIUM 20 MG PO TABS: 20.0000 mg | ORAL_TABLET | Freq: Three times a day (TID) | ORAL | Status: DC

## 2014-12-30 MED ORDER — AMLODIPINE BESYLATE 10 MG PO TABS: 10.0000 mg | ORAL_TABLET | Freq: Every day | ORAL | Status: DC

## 2014-12-30 MED ORDER — VALSARTAN 80 MG PO TABS: 80.0000 mg | ORAL_TABLET | Freq: Every day | ORAL | Status: DC

## 2014-12-30 MED ORDER — KETOCONAZOLE 2 % EX CREA: 1.0000 "application " | TOPICAL_CREAM | Freq: Every day | CUTANEOUS | Status: DC

## 2014-12-30 NOTE — Telephone Encounter (Signed)
Looks like we did this at her visit today, if not OK to refill

## 2014-12-30 NOTE — Progress Notes (Signed)
Pre visit review using our clinic review tool, if applicable. No additional management support is needed unless otherwise documented below in the visit note. 

## 2014-12-30 NOTE — Assessment & Plan Note (Signed)
Well controlled, no changes to meds. Encouraged heart healthy diet such as the DASH diet and exercise as tolerated.  °

## 2014-12-30 NOTE — Assessment & Plan Note (Addendum)
Flared recently, improved presently

## 2014-12-30 NOTE — Telephone Encounter (Signed)
Caller name: Anderson Malta @ [facility] on [address] Can be reached: [phone]  Reason for call: Insurance is not coving lansoprazole (PREVACID 24HR) 15 MG capsule. Asking for new order to be called in.

## 2014-12-30 NOTE — Telephone Encounter (Signed)
The reason for the call was to inform what was sent in today at her OV is no longer covered by insurance and they are asking for an alternative.

## 2014-12-30 NOTE — Patient Instructions (Signed)
Consider Curcumen/Turmeric caps daily  [place] can order at [email or web address].

## 2014-12-30 NOTE — Progress Notes (Signed)
Lori Zamora  [id number] 02/16/1956 12/30/2014      Progress Note-Follow Up  Subjective  Chief Complaint  Chief Complaint  Patient presents with  . Follow-up    HPI  Patient is a 59 y.o. female in today for routine medical care. Patient is in today for follow-up continuing to struggle. She has chronic pain and fatigue. She had a recent flare in her prostatitis with swelling and pain but it is now improved. She is following still with rheumatology at [facility] as well as [facility]. Blood sugars have been well-controlled. No polyuria or polydipsia. Has struggled some with constipation but magnesium has been helpful to manage this. Follows with Dr. Matthew Saras for her GYN care. Denies any complaints. No other acute recent illness. Well controlled, no changes to meds. Encouraged heart healthy diet such as the DASH diet and exercise as tolerated.   Past Medical History  Diagnosis Date  . Unspecified sleep apnea   . Acquired acanthosis nigricans   . Unspecified vitamin D deficiency   . Bipolar disorder, unspecified   . Degeneration of intervertebral disc, site unspecified   . Other thalassemia   . Other specified iron deficiency anemias   . Esophageal reflux   . Systemic lupus erythematosus   . Unspecified constipation   . Migraine with aura, without mention of intractable migraine without mention of status migrainosus   . Anemia, unspecified   . Nontoxic multinodular goiter   . Myalgia and myositis, unspecified   . Other malaise and fatigue   . Unspecified essential hypertension   . Other and unspecified hyperlipidemia   . Type II or unspecified type diabetes mellitus without mention of complication, not stated as uncontrolled   . Depressive disorder, not elsewhere classified   . 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 Sjogren's syndrome   . CFS (chronic fatigue syndrome)   . Unspecified diffuse connective tissue disease 01/27/2013   Follows at [facility] rheumatology, Dr Alanda Amass Per patient has tested 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 antibody  . Renal insufficiency 01/27/2013  . Pelvic floor dysfunction 01/27/2013  . Microcytic anemia 01/31/2010    Qualifier: Diagnosis of  By: Fuller Plan CMA (AAMA), Lugene    . Bipolar 2 disorder 01/31/2010    Qualifier: Diagnosis of  By: Fuller Plan CMA (AAMA), Terri Skains  Follows with Dr Letta Moynahan of psychiatry   . Intertriginous candidiasis 11/29/2013  . Small intestinal bacterial overgrowth 11/29/2013  . 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 Abdominal pain, unspecified site 11/29/2013  . Preventative health care 09/12/2014    Past Surgical History  Procedure Laterality Date  . Laparoscopic total hysterectomy  2004    Family History  Problem Relation Age of Onset  . Diabetes Mother   . Heart disease Mother     family history  . Allergies Mother   . Obesity Sister   . Alcohol abuse      Family history of alcoholism and addiction  . Hypertension      family history  . Kidney disease      family history  . Stroke      1st degree relative <60  . Alcohol abuse Brother     drug abuse  . Heart disease Maternal Grandmother   . Heart disease Maternal Grandfather     History   Social History  . Marital Status: Single    Spouse Name: N/A  . Number of Children:  0  . Years of Education: N/A   Occupational History  . Teacher    Social History Main Topics  . Smoking status: Never Smoker   . Smokeless tobacco: Never Used  . Alcohol Use: No  . Drug Use: No  . Sexual Activity: No     Comment: lives alone, no major dietary restrictions, retired from teaching kindergarten   Other Topics Concern  . 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 cholecalciferol (VITAMIN D) 1000  UNITS tablet Take 2,000 Units by mouth daily.     . DESONATE 0.05 % gel APPLY TOPICALLY TWICE DAILY 60 g 0  . diphenhydrAMINE (BENADRYL) 25 MG tablet Take 25 mg by mouth as needed.    Marland Kitchen glucose blood test strip 1 each by Other route daily. One Touch Ultra     . ibuprofen (ADVIL) 200 MG tablet Take 200 mg by mouth every 6 (six) hours as needed.      . Lidocaine, Anorectal, 5 % CREA Apply topically as needed.    . Magnesium Oxide 500 MG (LAX) TABS Take by mouth daily. Take 1-4 tablets daily.    . metFORMIN (GLUCOPHAGE) 500 MG tablet Take 500 mg by mouth 2 (two) times daily with a meal.     . NON FORMULARY Lubricant eye ointment- sterile mineral oil 39.9% White Petroleum 57.7%    . NON FORMULARY Biotene dry mouth oral rinse    . Polyethyl Glycol-Propyl Glycol (SYSTANE) 0.4-0.3 % GEL Apply to eye.    Marland Kitchen PRESCRIPTION MEDICATION prevident 5000- dry mouth toothpaste    . RA SUNSCREEN SPF50 EX Apply topically as needed.      . sitaGLIPtan (JANUVIA) 100 MG tablet Take 100 mg by mouth daily.       No current facility-administered medications on file prior to visit.    Allergies  Allergen Reactions  . Acetazolamide   . Azathioprine   . Ezetimibe   . Latex   . Lisinopril   . Meloxicam   . Penicillins   . Pravastatin Sodium   . Simvastatin   . Sulfonamide Derivatives     REACTION: Swelling of tongue and face    Review of Systems  Review of Systems  Constitutional: Positive for malaise/fatigue. Negative for fever.  HENT: Negative for congestion.   Eyes: Negative for discharge.  Respiratory: Negative for shortness of breath.   Cardiovascular: Negative for chest pain, palpitations and leg swelling.  Gastrointestinal: Negative for nausea, abdominal pain and diarrhea.  Genitourinary: Negative for dysuria.  Musculoskeletal: Positive for myalgias, joint pain and neck pain. Negative for falls.  Skin: Negative for rash.  Neurological: Negative for loss of consciousness and headaches.    Endo/Heme/Allergies: Negative for polydipsia.  Psychiatric/Behavioral: Negative for depression and suicidal ideas. The patient is not nervous/anxious and does not have insomnia.     Objective  BP 104/68 mmHg  Pulse 109  Temp(Src) 98.7 F (37.1 C) (Oral)  Ht 5' 5.5" (1.664 m)  Wt 175 lb 2 oz (79.436 kg)  BMI 28.69 kg/m2  SpO2 96%  Physical Exam  Physical Exam  Constitutional: She is oriented to person, place, and time and well-developed, well-nourished, and in no distress. No distress.  HENT:  Head: Normocephalic and atraumatic.  Eyes: Conjunctivae are normal.  Neck: Neck supple. No thyromegaly present.  Cardiovascular: Normal rate,  regular rhythm and normal heart sounds.   No murmur heard. Pulmonary/Chest: Effort normal and breath sounds normal. She has no wheezes.  Abdominal: She exhibits no distension and no mass.  Musculoskeletal: She exhibits no edema.  Lymphadenopathy:    She has no cervical adenopathy.  Neurological: She is alert and oriented to person, place, and time.  Skin: Skin is warm and dry. No rash noted. She is not diaphoretic.  Psychiatric: Memory, affect and judgment normal.    Lab Results  Component Value Date   TSH 1.067 11/26/2013   Lab Results  Component Value Date   WBC 8.5 09/06/2014   HGB 13.0 09/06/2014   HCT 40.6 09/06/2014   MCV 79.4 09/06/2014   PLT 416.0* 09/06/2014   Lab Results  Component Value Date   CREATININE 0.78 09/06/2014   BUN 9 09/06/2014   NA 140 09/06/2014   K 3.4* 09/06/2014   CL 103 09/06/2014   CO2 27 09/06/2014   Lab Results  Component Value Date   ALT 16 09/06/2014   AST 15 09/06/2014   ALKPHOS 130* 09/06/2014   BILITOT 0.3 09/06/2014   Lab Results  Component Value Date   CHOL 164 09/06/2014   Lab Results  Component Value Date   HDL 45.90 09/06/2014   Lab Results  Component Value Date   LDLCALC 96 09/06/2014   Lab Results  Component Value Date   TRIG 113.0 09/06/2014   Lab Results   Component Value Date   CHOLHDL 4 09/06/2014     Assessment & Plan  Essential hypertension Well controlled, no changes to meds. Encouraged heart healthy diet such as the DASH diet and exercise as tolerated.    Parotid gland enlargement Flared recently, improved presently   Diabetes mellitus type 2 in nonobese hgba1c acceptable, minimize simple carbs. Increase exercise as tolerated. Continue current meds, folllowing with endocrinology   Nontoxic multinodular goiter With thyroid nodules monitoring with endocrinology.   Other iron deficiency anemias Increase leafy greens, consider increased lean red meat and using cast iron cookware. Continue to monitor, report any concerns, is following with hematology

## 2015-01-06 ENCOUNTER — Other Ambulatory Visit: Payer: Self-pay | Admitting: Internal Medicine

## 2015-01-06 DIAGNOSIS — E041 Nontoxic single thyroid nodule: Secondary | ICD-10-CM

## 2015-01-10 NOTE — Assessment & Plan Note (Signed)
hgba1c acceptable, minimize simple carbs. Increase exercise as tolerated. Continue current meds, folllowing with endocrinology

## 2015-01-10 NOTE — Assessment & Plan Note (Addendum)
Increase leafy greens, consider increased lean red meat and using cast iron cookware. Continue to monitor, report any concerns, is following with hematology

## 2015-01-10 NOTE — Assessment & Plan Note (Signed)
With thyroid nodules monitoring with endocrinology.

## 2015-01-13 ENCOUNTER — Ambulatory Visit
Admission: RE | Admit: 2015-01-13 | Discharge: 2015-01-13 | Disposition: A | Discharge status: Home / Self Care | Payer: BC Managed Care – PPO | Source: Ambulatory Visit | Attending: Internal Medicine | Admitting: Internal Medicine

## 2015-01-13 ENCOUNTER — Other Ambulatory Visit (HOSPITAL_COMMUNITY)
Admission: RE | Admit: 2015-01-13 | Discharge: 2015-01-13 | Disposition: A | Discharge status: Home / Self Care | Payer: BC Managed Care – PPO | Source: Ambulatory Visit | Attending: Diagnostic Radiology | Admitting: Diagnostic Radiology

## 2015-01-13 DIAGNOSIS — E041 Nontoxic single thyroid nodule: Secondary | ICD-10-CM

## 2015-02-07 ENCOUNTER — Other Ambulatory Visit: Payer: Self-pay

## 2015-03-13 ENCOUNTER — Other Ambulatory Visit: Payer: Self-pay | Admitting: Family Medicine

## 2015-03-17 ENCOUNTER — Other Ambulatory Visit: Payer: Self-pay | Admitting: Family Medicine

## 2015-03-17 DIAGNOSIS — Z1231 Encounter for screening mammogram for malignant neoplasm of breast: Secondary | ICD-10-CM

## 2015-04-19 ENCOUNTER — Ambulatory Visit (HOSPITAL_BASED_OUTPATIENT_CLINIC_OR_DEPARTMENT_OTHER): Payer: BC Managed Care – PPO

## 2015-04-22 ENCOUNTER — Ambulatory Visit (HOSPITAL_BASED_OUTPATIENT_CLINIC_OR_DEPARTMENT_OTHER)
Admission: RE | Admit: 2015-04-22 | Discharge: 2015-04-22 | Disposition: A | Discharge status: Home / Self Care | Payer: BC Managed Care – PPO | Source: Ambulatory Visit | Attending: Family Medicine | Admitting: Family Medicine

## 2015-04-22 DIAGNOSIS — Z1231 Encounter for screening mammogram for malignant neoplasm of breast: Secondary | ICD-10-CM

## 2015-04-22 LAB — HM MAMMOGRAPHY

## 2015-06-07 ENCOUNTER — Encounter: Payer: Self-pay | Admitting: Family Medicine

## 2015-06-07 ENCOUNTER — Ambulatory Visit (INDEPENDENT_AMBULATORY_CARE_PROVIDER_SITE_OTHER): Payer: BC Managed Care – PPO | Admitting: Family Medicine

## 2015-06-07 VITALS — BP 114/76 | HR 99 | Temp 98.2°F | Ht 65.5 in | Wt 183.1 lb

## 2015-06-07 DIAGNOSIS — R748 Abnormal levels of other serum enzymes: Secondary | ICD-10-CM | POA: Diagnosis not present

## 2015-06-07 DIAGNOSIS — E119 Type 2 diabetes mellitus without complications: Secondary | ICD-10-CM | POA: Diagnosis not present

## 2015-06-07 DIAGNOSIS — K3184 Gastroparesis: Secondary | ICD-10-CM | POA: Diagnosis not present

## 2015-06-07 DIAGNOSIS — D568 Other thalassemias: Secondary | ICD-10-CM

## 2015-06-07 DIAGNOSIS — M359 Systemic involvement of connective tissue, unspecified: Secondary | ICD-10-CM | POA: Diagnosis not present

## 2015-06-07 DIAGNOSIS — K5900 Constipation, unspecified: Secondary | ICD-10-CM

## 2015-06-07 DIAGNOSIS — I10 Essential (primary) hypertension: Secondary | ICD-10-CM

## 2015-06-07 DIAGNOSIS — D508 Other iron deficiency anemias: Secondary | ICD-10-CM

## 2015-06-07 DIAGNOSIS — N289 Disorder of kidney and ureter, unspecified: Secondary | ICD-10-CM

## 2015-06-07 DIAGNOSIS — Z23 Encounter for immunization: Secondary | ICD-10-CM | POA: Diagnosis not present

## 2015-06-07 DIAGNOSIS — E559 Vitamin D deficiency, unspecified: Secondary | ICD-10-CM

## 2015-06-07 DIAGNOSIS — E782 Mixed hyperlipidemia: Secondary | ICD-10-CM

## 2015-06-07 HISTORY — DX: Gastroparesis: K31.84

## 2015-06-07 NOTE — Patient Instructions (Signed)
Consider yellow split pea powder or whey powder   Hypertension Hypertension, commonly called high blood pressure, is when the force of blood pumping through your arteries is too strong. Your arteries are the blood vessels that carry blood from your heart throughout your body. A blood pressure reading consists of a higher number over a lower number, such as 110/72. The higher number (systolic) is the pressure inside your arteries when your heart pumps. The lower number (diastolic) is the pressure inside your arteries when your heart relaxes. Ideally you want your blood pressure below 120/80. Hypertension forces your heart to work harder to pump blood. Your arteries may become narrow or stiff. Having untreated or uncontrolled hypertension can cause heart attack, stroke, kidney disease, and other problems. RISK FACTORS Some risk factors for high blood pressure are controllable. Others are not.  Risk factors you cannot control include:   Race. You may be at higher risk if you are African American.  Age. Risk increases with age.  Gender. Men are at higher risk than women before age 88 years. After age 7, women are at higher risk than men. Risk factors you can control include:  Not getting enough exercise or physical activity.  Being overweight.  Getting too much fat, sugar, calories, or salt in your diet.  Drinking too much alcohol. SIGNS AND SYMPTOMS Hypertension does not usually cause signs or symptoms. Extremely high blood pressure (hypertensive crisis) may cause headache, anxiety, shortness of breath, and nosebleed. DIAGNOSIS To check if you have hypertension, your health care provider will measure your blood pressure while you are seated, with your arm held at the level of your heart. It should be measured at least twice using the same arm. Certain conditions can cause a difference in blood pressure between your right and left arms. A blood pressure reading that is higher than normal on  one occasion does not mean that you need treatment. If it is not clear whether you have high blood pressure, you may be asked to return on a different day to have your blood pressure checked again. Or, you may be asked to monitor your blood pressure at home for 1 or more weeks. TREATMENT Treating high blood pressure includes making lifestyle changes and possibly taking medicine. Living a healthy lifestyle can help lower high blood pressure. You may need to change some of your habits. Lifestyle changes may include:  Following the DASH diet. This diet is high in fruits, vegetables, and whole grains. It is low in salt, red meat, and added sugars.  Keep your sodium intake below 2,300 mg per day.  Getting at least 30-45 minutes of aerobic exercise at least 4 times per week.  Losing weight if necessary.  Not smoking.  Limiting alcoholic beverages.  Learning ways to reduce stress. Your health care provider may prescribe medicine if lifestyle changes are not enough to get your blood pressure under control, and if one of the following is true:  You are 76-77 years of age and your systolic blood pressure is above 140.  You are 58 years of age or older, and your systolic blood pressure is above 150.  Your diastolic blood pressure is above 90.  You have diabetes, and your systolic blood pressure is over 678 or your diastolic blood pressure is over 90.  You have kidney disease and your blood pressure is above 140/90.  You have heart disease and your blood pressure is above 140/90. Your personal target blood pressure may vary depending on your  medical conditions, your age, and other factors. HOME CARE INSTRUCTIONS  Have your blood pressure rechecked as directed by your health care provider.   Take medicines only as directed by your health care provider. Follow the directions carefully. Blood pressure medicines must be taken as prescribed. The medicine does not work as well when you skip doses.  Skipping doses also puts you at risk for problems.  Do not smoke.   Monitor your blood pressure at home as directed by your health care provider. SEEK MEDICAL CARE IF:   You think you are having a reaction to medicines taken.  You have recurrent headaches or feel dizzy.  You have swelling in your ankles.  You have trouble with your vision. SEEK IMMEDIATE MEDICAL CARE IF:  You develop a severe headache or confusion.  You have unusual weakness, numbness, or feel faint.  You have severe chest or abdominal pain.  You vomit repeatedly.  You have trouble breathing. MAKE SURE YOU:   Understand these instructions.  Will watch your condition.  Will get help right away if you are not doing well or get worse.   This information is not intended to replace advice given to you by your health care provider. Make sure you discuss any questions you have with your health care provider.   Document Released: 07/30/2005 Document Revised: 12/14/2014 Document Reviewed: 05/22/2013 Elsevier Interactive Patient Education [employer].

## 2015-06-07 NOTE — Progress Notes (Signed)
Pre visit review using our clinic review tool, if applicable. No additional management support is needed unless otherwise documented below in the visit note. 

## 2015-06-08 LAB — COMPREHENSIVE METABOLIC PANEL
ALT: 16 U/L (ref 0–35)
AST: 17 U/L (ref 0–37)
Albumin: 4.3 g/dL (ref 3.5–5.2)
Alkaline Phosphatase: 120 U/L — ABNORMAL HIGH (ref 39–117)
BILIRUBIN TOTAL: 0.3 mg/dL (ref 0.2–1.2)
BUN: 15 mg/dL (ref 6–23)
CALCIUM: 10.4 mg/dL (ref 8.4–10.5)
CO2: 26 mEq/L (ref 19–32)
Chloride: 105 mEq/L (ref 96–112)
Creatinine, Ser: 0.86 mg/dL (ref 0.40–1.20)
GFR: 87.05 mL/min (ref >60.00)
GLUCOSE: 77 mg/dL (ref 70–99)
Potassium: 4.6 mEq/L (ref 3.5–5.1)
Sodium: 142 mEq/L (ref 135–145)
Total Protein: 7.6 g/dL (ref 6.0–8.3)

## 2015-06-08 LAB — CBC
HCT: 39.3 % (ref 36.0–46.0)
Hemoglobin: 12.3 g/dL (ref 12.0–15.0)
MCHC: 31.4 g/dL (ref 30.0–36.0)
MCV: 78.9 fl (ref 78.0–100.0)
Platelets: 500 10*3/uL — ABNORMAL HIGH (ref 150.0–400.0)
RBC: 4.98 Mil/uL (ref 3.87–5.11)
RDW: 17.3 % — AB (ref 11.5–15.5)
WBC: 10.4 10*3/uL (ref 4.0–10.5)

## 2015-06-08 LAB — LIPID PANEL
CHOLESTEROL: 160 mg/dL (ref 0–200)
HDL: 34.3 mg/dL — ABNORMAL LOW (ref >39.00)
LDL Cholesterol: 95 mg/dL (ref 0–99)
NonHDL: 125.87
TRIGLYCERIDES: 154 mg/dL — AB (ref 0.0–149.0)
Total CHOL/HDL Ratio: 5
VLDL: 30.8 mg/dL (ref 0.0–40.0)

## 2015-06-08 LAB — VITAMIN D 25 HYDROXY (VIT D DEFICIENCY, FRACTURES): VITD: 46.79 ng/mL (ref 30.00–100.00)

## 2015-06-08 LAB — VITAMIN B12: Vitamin B-12: 359 pg/mL (ref 211–911)

## 2015-06-08 LAB — MAGNESIUM: Magnesium: 2.2 mg/dL (ref 1.5–2.5)

## 2015-06-08 LAB — IBC PANEL
IRON: 63 ug/dL (ref 42–145)
Saturation Ratios: 15.9 % — ABNORMAL LOW (ref 20.0–50.0)
TRANSFERRIN: 283 mg/dL (ref 212.0–360.0)

## 2015-06-08 LAB — FOLATE: Folate: 16.4 ng/mL (ref >5.9)

## 2015-06-08 LAB — FERRITIN: Ferritin: 42.4 ng/mL (ref 10.0–291.0)

## 2015-06-11 LAB — ZINC: ZINC: 86 ug/dL (ref 60–130)

## 2015-06-13 ENCOUNTER — Telehealth: Payer: Self-pay | Admitting: Family Medicine

## 2015-06-13 ENCOUNTER — Other Ambulatory Visit: Payer: Self-pay | Admitting: Family Medicine

## 2015-06-13 LAB — VITAMIN B1: VITAMIN B1 (THIAMINE): 10 nmol/L (ref 8–30)

## 2015-06-13 MED ORDER — ATORVASTATIN CALCIUM 20 MG PO TABS: 20.0000 mg | ORAL_TABLET | Freq: Three times a day (TID) | ORAL | Status: DC

## 2015-06-13 NOTE — Telephone Encounter (Signed)
Advise on Atorvastatin refill.  Instructions states to take 1 by mouth three times daily.  Pharmacy states not normally take three times daily.  Has been refilled this way in previous refills.  Advise please.

## 2015-06-13 NOTE — Telephone Encounter (Signed)
Not sure what happened but her sig should read Atorvastatin 20 mg tabs 3 tabs po qhs, disp #270 with 1 rf

## 2015-06-14 NOTE — Telephone Encounter (Signed)
Pharmacy was notified what they received was correct.  Faxed in confirmation to them signed by PCP.

## 2015-06-19 NOTE — Assessment & Plan Note (Signed)
minimize simple carbs. Increase exercise as tolerated.  

## 2015-06-19 NOTE — Assessment & Plan Note (Signed)
Follows with Dr Clydene Laming at [facility]. Is controlled for the most part on current meds.

## 2015-06-19 NOTE — Assessment & Plan Note (Signed)
Tolerating statin, encouraged heart healthy diet, avoid trans fats, minimize simple carbs and saturated fats. Increase exercise as tolerated 

## 2015-06-19 NOTE — Assessment & Plan Note (Signed)
Well controlled, no changes to meds. Encouraged heart healthy diet such as the DASH diet and exercise as tolerated.  °

## 2015-06-19 NOTE — Progress Notes (Signed)
Subjective:    Patient ID: Lori Zamora, female    DOB: Nov 27, 1955, 59 y.o.   MRN: [id number]  Chief Complaint  Patient presents with  . Follow-up    6 month    HPI Patient is in today for follow-up. She continues to follow-up with gastroenterology at [facility] for her gastroparesis. Is doing somewhat better on current medications but struggles daily. Notes that sugars have been well-controlled generally between 80 and 120. Denies polyuria or polydipsia. Did have a thyroid biopsy back in June which was benign. No recent illness. hgba1c acceptable, minimize simple carbs. Increase exercise as tolerated. Continue current meds  Past Medical History  Diagnosis Date  . Unspecified sleep apnea   . Acquired acanthosis nigricans   . Unspecified vitamin D deficiency   . Bipolar disorder, unspecified ([place])   . Degeneration of intervertebral disc, site unspecified   . Other thalassemia ([place])   . Other specified iron deficiency anemias   . Esophageal reflux   . Systemic lupus erythematosus ([place])   . Unspecified constipation   . Migraine with aura, without mention of intractable migraine without mention of status migrainosus   . Anemia, unspecified   . Nontoxic multinodular goiter   . Myalgia and myositis, unspecified   . Other malaise and fatigue   . Unspecified essential hypertension   . Other and unspecified hyperlipidemia   . Type II or unspecified type diabetes mellitus without mention of complication, not stated as uncontrolled   . Depressive disorder, not elsewhere classified   . 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 Sjogren's syndrome ([place])   . CFS (chronic fatigue syndrome)   . Unspecified diffuse connective tissue disease 01/27/2013    Follows at [facility] rheumatology, Dr Alanda Amass Per patient has tested 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 antibody  . Renal insufficiency 01/27/2013  . Pelvic floor  dysfunction 01/27/2013  . Microcytic anemia 01/31/2010    Qualifier: Diagnosis of  By: Fuller Plan CMA (AAMA), [place]    . Bipolar 2 disorder ([place]) 01/31/2010    Qualifier: Diagnosis of  By: Fuller Plan CMA (AAMA), Terri Skains  Follows with Dr Letta Moynahan of psychiatry   . Intertriginous candidiasis 11/29/2013  . Small intestinal bacterial overgrowth 11/29/2013  . 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 Abdominal pain, unspecified site 11/29/2013  . Preventative health care 09/12/2014  . Gastroparesis 06/07/2015    Past Surgical History  Procedure Laterality Date  . Laparoscopic total hysterectomy  2004  . Biopsy thyroid      01/13/2015, 3 previous biopsy reported all benign    Family History  Problem Relation Age of Onset  . Diabetes Mother   . Heart disease Mother     family history  . Allergies Mother   . Obesity Sister   . Alcohol abuse      Family history of alcoholism and addiction  . Hypertension      family history  . Kidney disease      family history  . Stroke      1st degree relative <60  . Alcohol abuse Brother     drug abuse  . Heart disease Maternal Grandmother   . Heart disease Maternal Grandfather     Social History   Social History  . Marital Status: Single    Spouse Name: N/A  . Number of Children: 0  . Years of Education: N/A   Occupational History  . Teacher  Social History Main Topics  . Smoking status: Never Smoker   . Smokeless tobacco: Never Used  . Alcohol Use: No  . Drug Use: No  . Sexual Activity: No     Comment: lives alone, no major dietary restrictions, retired from teaching kindergarten   Other Topics Concern  . 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 amLODipine (NORVASC) 10 MG tablet Take 1 tablet (10 mg total) by mouth daily. 90 tablet 2  . atorvastatin (LIPITOR) 20 MG tablet TAKE 1  TABLET BY MOUTH THREE TIMES DAILY 270 tablet 0  . cholecalciferol (VITAMIN D) 1000 UNITS tablet Take 2,000 Units by mouth daily.     . DESONATE 0.05 % gel APPLY TOPICALLY TWICE DAILY 60 g 0  . diphenhydrAMINE (BENADRYL) 25 MG tablet Take 25 mg by mouth as needed.    Marland Kitchen glucose blood test strip 1 each by Other route daily. One Touch Ultra     . ibuprofen (ADVIL) 200 MG tablet Take 200 mg by mouth every 6 (six) hours as needed.      Marland Kitchen ketoconazole (NIZORAL) 2 % cream Apply 1 application topically daily. 60 g 6  . lansoprazole (PREVACID 24HR) 15 MG capsule TAKE 2 CAPSULES BY MOUTH TWICE DAILY BEFORE A MEAL 360 capsule 2  . Lidocaine, Anorectal, 5 % CREA Apply topically as needed.    . Magnesium Oxide 500 MG (LAX) TABS Take by mouth daily. Take 1-4 tablets daily.    . metFORMIN (GLUCOPHAGE) 500 MG tablet Take 500 mg by mouth 2 (two) times daily with a meal.     . NON FORMULARY Lubricant eye ointment- sterile mineral oil 39.9% White Petroleum 57.7%    . NON FORMULARY Biotene dry mouth oral rinse    . Polyethyl Glycol-Propyl Glycol (SYSTANE) 0.4-0.3 % GEL Apply to eye.    Marland Kitchen PRESCRIPTION MEDICATION prevident 5000- dry mouth toothpaste    . RA SUNSCREEN SPF50 EX Apply topically as needed.      . sitaGLIPtan (JANUVIA) 100 MG tablet Take 100 mg by mouth daily.      . valsartan (DIOVAN) 80 MG tablet Take 1 tablet (80 mg total) by mouth daily. 90 tablet 2  . atorvastatin (LIPITOR) 20 MG tablet Take 1 tablet (20 mg total) by mouth 3 (three) times daily. 270 tablet 2  . Bacillus Coagulans-Inulin (PROBIOTIC-PREBIOTIC) 1-250 BILLION-MG CAPS Take by mouth.     No facility-administered medications prior to visit.    Allergies  Allergen Reactions  . Acetazolamide   . Azathioprine   . Ezetimibe   . Latex   . Lisinopril   . Meloxicam   . Penicillins   . Pravastatin Sodium   . Simvastatin   . Sulfonamide Derivatives     REACTION: Swelling of tongue and face    Review of Systems  Constitutional:  Positive for malaise/fatigue. Negative for fever.  HENT: Negative for congestion.   Eyes: Negative for discharge.  Respiratory: Negative for shortness of breath.   Cardiovascular: Negative for chest pain, palpitations and leg swelling.  Gastrointestinal: Positive for heartburn and nausea. Negative for abdominal pain.  Genitourinary: Negative for dysuria.  Musculoskeletal: Positive for myalgias and joint pain. Negative for falls.  Skin: Negative for rash.  Neurological: Negative for loss of consciousness and headaches.  Endo/Heme/Allergies: Negative for environmental allergies.  Psychiatric/Behavioral: Negative for depression. The  patient is not nervous/anxious.        Objective:    Physical Exam  Constitutional: She is oriented to person, place, and time. She appears well-developed and well-nourished. No distress.  HENT:  Head: Normocephalic and atraumatic.  Nose: Nose normal.  Eyes: Right eye exhibits no discharge. Left eye exhibits no discharge.  Neck: Normal range of motion. Neck supple.  Cardiovascular: Normal rate and regular rhythm.   No murmur heard. Pulmonary/Chest: Effort normal and breath sounds normal.  Abdominal: Soft. Bowel sounds are normal. There is no tenderness.  Musculoskeletal: She exhibits no edema.  Neurological: She is alert and oriented to person, place, and time.  Skin: Skin is warm and dry.  Psychiatric: She has a normal mood and affect.  Nursing note and vitals reviewed.   BP 114/76 mmHg  Pulse 99  Temp(Src) 98.2 F (36.8 C) (Oral)  Ht 5' 5.5" (1.664 m)  Wt 183 lb 2 oz (83.065 kg)  BMI 30.00 kg/m2  SpO2 98% Wt Readings from Last 3 Encounters:  06/07/15 183 lb 2 oz (83.065 kg)  12/30/14 175 lb 2 oz (79.436 kg)  09/06/14 170 lb 12.8 oz (77.474 kg)     Lab Results  Component Value Date   WBC 10.4 06/07/2015   HGB 12.3 06/07/2015   HCT 39.3 06/07/2015   PLT 500.0* 06/07/2015   GLUCOSE 77 06/07/2015   CHOL 160 06/07/2015   TRIG 154.0*  06/07/2015   HDL 34.30* 06/07/2015   LDLDIRECT 95 12/22/2009   LDLCALC 95 06/07/2015   ALT 16 06/07/2015   AST 17 06/07/2015   NA 142 06/07/2015   K 4.6 06/07/2015   CL 105 06/07/2015   CREATININE 0.86 06/07/2015   BUN 15 06/07/2015   CO2 26 06/07/2015   TSH 1.067 11/26/2013   HGBA1C 5.6 01/11/2014   MICROALBUR 4.05* 03/22/2014    Lab Results  Component Value Date   TSH 1.067 11/26/2013   Lab Results  Component Value Date   WBC 10.4 06/07/2015   HGB 12.3 06/07/2015   HCT 39.3 06/07/2015   MCV 78.9 06/07/2015   PLT 500.0* 06/07/2015   Lab Results  Component Value Date   NA 142 06/07/2015   K 4.6 06/07/2015   CO2 26 06/07/2015   GLUCOSE 77 06/07/2015   BUN 15 06/07/2015   CREATININE 0.86 06/07/2015   BILITOT 0.3 06/07/2015   ALKPHOS 120* 06/07/2015   AST 17 06/07/2015   ALT 16 06/07/2015   PROT 7.6 06/07/2015   ALBUMIN 4.3 06/07/2015   CALCIUM 10.4 06/07/2015   ANIONGAP 13.4 12/22/2009   GFR 87.05 06/07/2015   Lab Results  Component Value Date   CHOL 160 06/07/2015   Lab Results  Component Value Date   HDL 34.30* 06/07/2015   Lab Results  Component Value Date   LDLCALC 95 06/07/2015   Lab Results  Component Value Date   TRIG 154.0* 06/07/2015   Lab Results  Component Value Date   CHOLHDL 5 06/07/2015   Lab Results  Component Value Date   HGBA1C 5.6 01/11/2014       Assessment & Plan:   Problem List Items Addressed This Visit    Vitamin D deficiency   Relevant Orders   CBC (Completed)   Vit D  25 hydroxy (rtn osteoporosis monitoring) (Completed)   IBC panel (Completed)   Ferritin (Completed)   Vitamin B12 (Completed)   Vitamin B1 (Completed)   Magnesium (Completed)   Comp Met (CMET) (Completed)   Folate (Completed)  Zinc (Completed)   Lipid panel (Completed)   Renal insufficiency   Relevant Orders   CBC (Completed)   Vit D  25 hydroxy (rtn osteoporosis monitoring) (Completed)   IBC panel (Completed)   Ferritin (Completed)    Vitamin B12 (Completed)   Vitamin B1 (Completed)   Magnesium (Completed)   Comp Met (CMET) (Completed)   Folate (Completed)   Zinc (Completed)   Lipid panel (Completed)   Other thalassemia (HCC)   Relevant Orders   CBC (Completed)   Vit D  25 hydroxy (rtn osteoporosis monitoring) (Completed)   IBC panel (Completed)   Ferritin (Completed)   Vitamin B12 (Completed)   Vitamin B1 (Completed)   Magnesium (Completed)   Comp Met (CMET) (Completed)   Folate (Completed)   Zinc (Completed)   Lipid panel (Completed)   Other iron deficiency anemias   Relevant Orders   CBC (Completed)   Vit D  25 hydroxy (rtn osteoporosis monitoring) (Completed)   IBC panel (Completed)   Ferritin (Completed)   Vitamin B12 (Completed)   Vitamin B1 (Completed)   Magnesium (Completed)   Comp Met (CMET) (Completed)   Folate (Completed)   Zinc (Completed)   Lipid panel (Completed)   Hyperlipidemia, mixed    Tolerating statin, encouraged heart healthy diet, avoid trans fats, minimize simple carbs and saturated fats. Increase exercise as tolerated      Relevant Orders   CBC (Completed)   Vit D  25 hydroxy (rtn osteoporosis monitoring) (Completed)   IBC panel (Completed)   Ferritin (Completed)   Vitamin B12 (Completed)   Vitamin B1 (Completed)   Magnesium (Completed)   Comp Met (CMET) (Completed)   Folate (Completed)   Zinc (Completed)   Lipid panel (Completed)   Gastroparesis    Follows with Dr Clydene Laming at [facility]. Is controlled for the most part on current meds.       Relevant Orders   CBC (Completed)   Vit D  25 hydroxy (rtn osteoporosis monitoring) (Completed)   IBC panel (Completed)   Ferritin (Completed)   Vitamin B12 (Completed)   Vitamin B1 (Completed)   Magnesium (Completed)   Comp Met (CMET) (Completed)   Folate (Completed)   Zinc (Completed)   Lipid panel (Completed)   Essential hypertension    Well controlled, no changes to meds. Encouraged heart healthy diet such as the DASH diet  and exercise as tolerated.       Relevant Orders   CBC (Completed)   Vit D  25 hydroxy (rtn osteoporosis monitoring) (Completed)   IBC panel (Completed)   Ferritin (Completed)   Vitamin B12 (Completed)   Vitamin B1 (Completed)   Magnesium (Completed)   Comp Met (CMET) (Completed)   Folate (Completed)   Zinc (Completed)   Lipid panel (Completed)   Elevated alkaline phosphatase level   Relevant Orders   CBC (Completed)   Vit D  25 hydroxy (rtn osteoporosis monitoring) (Completed)   IBC panel (Completed)   Ferritin (Completed)   Vitamin B12 (Completed)   Vitamin B1 (Completed)   Magnesium (Completed)   Comp Met (CMET) (Completed)   Folate (Completed)   Zinc (Completed)   Lipid panel (Completed)   Diffuse connective tissue disease (HCC)   Relevant Orders   CBC (Completed)   Vit D  25 hydroxy (rtn osteoporosis monitoring) (Completed)   IBC panel (Completed)   Ferritin (Completed)   Vitamin B12 (Completed)   Vitamin B1 (Completed)   Magnesium (Completed)   Comp Met (CMET) (Completed)   Folate (Completed)  Zinc (Completed)   Lipid panel (Completed)   Diabetes mellitus type 2 in nonobese (HCC)     minimize simple carbs. Increase exercise as tolerated.       Relevant Orders   CBC (Completed)   Vit D  25 hydroxy (rtn osteoporosis monitoring) (Completed)   IBC panel (Completed)   Ferritin (Completed)   Vitamin B12 (Completed)   Vitamin B1 (Completed)   Magnesium (Completed)   Comp Met (CMET) (Completed)   Folate (Completed)   Zinc (Completed)   Lipid panel (Completed)   Constipation   Relevant Orders   CBC (Completed)   Vit D  25 hydroxy (rtn osteoporosis monitoring) (Completed)   IBC panel (Completed)   Ferritin (Completed)   Vitamin B12 (Completed)   Vitamin B1 (Completed)   Magnesium (Completed)   Comp Met (CMET) (Completed)   Folate (Completed)   Zinc (Completed)   Lipid panel (Completed)    Other Visit Diagnoses    Encounter for immunization    -   Primary       I have discontinued Ms. Shanholtzer's PROBIOTIC-PREBIOTIC. I am also having her maintain her sitaGLIPtin, metFORMIN, cholecalciferol, glucose blood, RA SUNSCREEN SPF50 EX, ibuprofen, Magnesium Oxide, diphenhydrAMINE, Lidocaine (Anorectal), DESONATE, PRESCRIPTION MEDICATION, NON FORMULARY, NON FORMULARY, Polyethyl Glycol-Propyl Glycol, amLODipine, valsartan, lansoprazole, ketoconazole, atorvastatin, and Probiotic Product (DIGESTIVE ADVANTAGE GUMMIES PO).  Meds ordered this encounter  Medications  . Probiotic Product (DIGESTIVE ADVANTAGE GUMMIES PO)    Sig: Take by mouth 2 (two) times daily.     [name], MD

## 2015-07-04 ENCOUNTER — Ambulatory Visit: Payer: BC Managed Care – PPO | Admitting: Family Medicine

## 2015-07-26 ENCOUNTER — Telehealth: Payer: Self-pay | Admitting: Family Medicine

## 2015-07-26 NOTE — Telephone Encounter (Signed)
Updated her medication list.  Called the patient and did inform of PCP instructions.  Did not send in new amlodipine 5 as she has plenty of the 10 mg and will take half. The patient did verbally understand and agree to all instructions.

## 2015-07-26 NOTE — Telephone Encounter (Signed)
Dr. Buddy Duty took her off Januvia and started her on Invokana, he also increase the Metformin to 2000 mg daily.  Dr. Buddy Duty instructed she go off her BP medication Norvasc and she did, He thought the Invokana may decrease her BP too much.  But since being off norvasc her BP has increased and today having a headache all day and BP was 144/79 and previous 134/89.  Patient thinks she should go back on Norvasc but needs PCP;s instructions. She said DR. Buddy Duty was concerned her BP would be too low on the norvasc and infokana.

## 2015-07-26 NOTE — Telephone Encounter (Signed)
Have her start back on Amlodipine but just 5 mg po daily, disp #30 with 1 rf then let us check her BP next month with RN visit

## 2015-07-26 NOTE — Telephone Encounter (Signed)
Caller name: Kacee  Relationship to patient: Self   Can be reached: [phone]   Reason for call: Pt says that she has a question about her medication and is requesting a call back directly.    Please advise further.

## 2015-08-03 ENCOUNTER — Encounter: Payer: Self-pay | Admitting: Skilled Nursing Facility1

## 2015-08-03 ENCOUNTER — Encounter: Payer: BC Managed Care – PPO | Attending: Internal Medicine | Admitting: Skilled Nursing Facility1

## 2015-08-03 VITALS — Ht 65.0 in | Wt 180.0 lb

## 2015-08-03 DIAGNOSIS — Z713 Dietary counseling and surveillance: Secondary | ICD-10-CM | POA: Diagnosis not present

## 2015-08-03 DIAGNOSIS — E119 Type 2 diabetes mellitus without complications: Secondary | ICD-10-CM | POA: Insufficient documentation

## 2015-08-03 LAB — HM DIABETES EYE EXAM

## 2015-08-03 NOTE — Patient Instructions (Signed)
-  Try deep breathing exercises every day for 5 minutes

## 2015-08-03 NOTE — Progress Notes (Signed)
Physical activity hurt her Try Deep breatthing exercises every day for Diabetes Self-Management Education  Visit Type: First/Initial  Appt. Start Time: 2:00 Appt. End Time: 3:30  08/03/2015  Lori Zamora, identified by name and date of birth, is a 59 y.o. female with a diagnosis of Diabetes: Type 2.   ASSESSMENT  Height 5\' 5"  (1.651 m), weight 180 lb (81.647 kg). Body mass index is 29.95 kg/(m^2). Pt states she is not here for diabetes management but rather some ideas on how she should deal with her gastroparesis. Pt was extremely talkative and explained in detail her symptoms and many different diseases. Pts gastroparesis is hindering her from eating adequate nutrition and only allowing her to tolerate carbohydrate rich foods. Dietitian listened to her concerns and congratulated her on her research into her diseases and utilizing legitimate/trustworthy resources for her information. Dietitian worked with the pt to create a menu she felt she could tolerate and was nutrient dense/diabetes friendly. Pt states she stays positive but has trouble accepting her friends lack of support. Pt states she is doing the best she can for her quality of life. Pt states she cannot conduct any kind of physical exercising including: walking, biking, swimming, elliptical, yoga, weight bearing, etc. Or she will be in bed for the next 3 days due to extreme fatigue Pt states she does not necessarily have to be physically active to feel this way just emotion can make her feel this way such as being happy to see an old friend or getting bad news. Pt states she gained 20 pounds in 2 weeks and also states she has never had a fluid problem/it was not fluid. Pt states she weighs herself every day.      Diabetes Self-Management Education - 08/03/15 1419    Visit Information   Visit Type First/Initial   Initial Visit   Diabetes Type Type 2   Are you currently following a meal plan? No   Are you taking your  medications as prescribed? Yes   Date Diagnosed january 1999   Health Coping   How would you rate your overall health? Fair   Psychosocial Assessment   Patient Belief/Attitude about Diabetes Motivated to manage diabetes   Self-care barriers None   Self-management support Friends   Patient Concerns Nutrition/Meal planning   Special Needs None   Preferred Learning Style Auditory   Learning Readiness Contemplating   Complications   Last HgB A1C per patient/outside source 6.5 %   How often do you check your blood sugar? 1-2 times/day   Fasting Blood glucose range (mg/dL) 70-129   Number of hypoglycemic episodes per month 0   Number of hyperglycemic episodes per week 0   Have you had a dilated eye exam in the past 12 months? Yes   Have you had a dental exam in the past 12 months? Yes   Are you checking your feet? Yes   How many days per week are you checking your feet? 4   Dietary Intake   Breakfast cereal with lactaid   Snack (morning) cheetos, chips   Lunch cooked spinach, bread, potatoes   Snack (afternoon) rice crispy treats, tortilla chips   Dinner none   Snack (evening) cheetos   Exercise   Exercise Type ADL's   How many days per week to you exercise? 0   How many minutes per day do you exercise? 0   Total minutes per week of exercise 0   Patient Education   Previous Diabetes Education  Yes (please comment)  rembers it well from 1999   Outcomes   Expected Outcomes Demonstrated interest in learning. Expect positive outcomes   Program Status Completed      Individualized Plan for Diabetes Self-Management Training:   Learning Objective:  Patient will have a greater understanding of diabetes self-management. Patient education plan is to attend individual and/or group sessions per assessed needs and concerns.   Plan:   Patient Instructions  -Try deep breathing exercises every day for 5 minutes    Expected Outcomes:  Demonstrated interest in learning. Expect positive  outcomes  Education material provided: My Plate  If problems or questions, patient to contact team via:  Phone  Future DSME appointment:

## 2015-08-09 ENCOUNTER — Encounter: Payer: Self-pay | Admitting: Family Medicine

## 2015-09-09 ENCOUNTER — Encounter: Payer: BC Managed Care – PPO | Admitting: Family Medicine

## 2015-09-20 ENCOUNTER — Ambulatory Visit (INDEPENDENT_AMBULATORY_CARE_PROVIDER_SITE_OTHER): Payer: BC Managed Care – PPO | Admitting: Family Medicine

## 2015-09-20 ENCOUNTER — Encounter: Payer: Self-pay | Admitting: Family Medicine

## 2015-09-20 VITALS — BP 118/72 | HR 94 | Temp 98.7°F | Ht 65.0 in | Wt 176.4 lb

## 2015-09-20 DIAGNOSIS — I359 Nonrheumatic aortic valve disorder, unspecified: Secondary | ICD-10-CM

## 2015-09-20 DIAGNOSIS — Z0000 Encounter for general adult medical examination without abnormal findings: Secondary | ICD-10-CM

## 2015-09-20 DIAGNOSIS — K3184 Gastroparesis: Secondary | ICD-10-CM

## 2015-09-20 DIAGNOSIS — E119 Type 2 diabetes mellitus without complications: Secondary | ICD-10-CM

## 2015-09-20 DIAGNOSIS — F3181 Bipolar II disorder: Secondary | ICD-10-CM

## 2015-09-20 DIAGNOSIS — I10 Essential (primary) hypertension: Secondary | ICD-10-CM

## 2015-09-20 NOTE — Assessment & Plan Note (Signed)
Stable on Lansoprazole but insurance no longer covers, will switch to Omeprazole 20 mg capsules per formulary when her supply of Prevacid is gone she will notify us when it is time

## 2015-09-20 NOTE — Patient Instructions (Addendum)
Increase Amlodipine 5 mg twice daily. If BP goes too low let us know, if pulse is still too high let us know   Preventive Care for Adults, Female A healthy lifestyle and preventive care can promote health and wellness. Preventive health guidelines for women include the following key practices.  A routine yearly physical is a good way to check with your health care provider about your health and preventive screening. It is a chance to share any concerns and updates on your health and to receive a thorough exam.  Visit your dentist for a routine exam and preventive care every 6 months. Brush your teeth twice a day and floss once a day. Good oral hygiene prevents tooth decay and gum disease.  The frequency of eye exams is based on your age, health, family medical history, use of contact lenses, and other factors. Follow your health care provider's recommendations for frequency of eye exams.  Eat a healthy diet. Foods like vegetables, fruits, whole grains, low-fat dairy products, and lean protein foods contain the nutrients you need without too many calories. Decrease your intake of foods high in solid fats, added sugars, and salt. Eat the right amount of calories for you.Get information about a proper diet from your health care provider, if necessary.  Regular physical exercise is one of the most important things you can do for your health. Most adults should get at least 150 minutes of moderate-intensity exercise (any activity that increases your heart rate and causes you to sweat) each week. In addition, most adults need muscle-strengthening exercises on 2 or more days a week.  Maintain a healthy weight. The body mass index (BMI) is a screening tool to identify possible weight problems. It provides an estimate of body fat based on height and weight. Your health care provider can find your BMI and can help you achieve or maintain a healthy weight.For adults 20 years and older:  A BMI below 18.5 is  considered underweight.  A BMI of 18.5 to 24.9 is normal.  A BMI of 25 to 29.9 is considered overweight.  A BMI of 30 and above is considered obese.  Maintain normal blood lipids and cholesterol levels by exercising and minimizing your intake of saturated fat. Eat a balanced diet with plenty of fruit and vegetables. Blood tests for lipids and cholesterol should begin at age 78 and be repeated every 5 years. If your lipid or cholesterol levels are high, you are over 50, or you are at high risk for heart disease, you may need your cholesterol levels checked more frequently.Ongoing high lipid and cholesterol levels should be treated with medicines if diet and exercise are not working.  If you smoke, find out from your health care provider how to quit. If you do not use tobacco, do not start.  Lung cancer screening is recommended for adults aged 91-80 years who are at high risk for developing lung cancer because of a history of smoking. A yearly low-dose CT scan of the lungs is recommended for people who have at least a 30-pack-year history of smoking and are a current smoker or have quit within the past 15 years. A pack year of smoking is smoking an average of 1 pack of cigarettes a day for 1 year (for example: 1 pack a day for 30 years or 2 packs a day for 15 years). Yearly screening should continue until the smoker has stopped smoking for at least 15 years. Yearly screening should be stopped for people who  develop a health problem that would prevent them from having lung cancer treatment.  If you are pregnant, do not drink alcohol. If you are breastfeeding, be very cautious about drinking alcohol. If you are not pregnant and choose to drink alcohol, do not have more than 1 drink per day. One drink is considered to be 12 ounces (355 mL) of beer, 5 ounces (148 mL) of wine, or 1.5 ounces (44 mL) of liquor.  Avoid use of street drugs. Do not share needles with anyone. Ask for help if you need support or  instructions about stopping the use of drugs.  High blood pressure causes heart disease and increases the risk of stroke. Your blood pressure should be checked at least every 1 to 2 years. Ongoing high blood pressure should be treated with medicines if weight loss and exercise do not work.  If you are 74-3 years old, ask your health care provider if you should take aspirin to prevent strokes.  Diabetes screening is done by taking a blood sample to check your blood glucose level after you have not eaten for a certain period of time (fasting). If you are not overweight and you do not have risk factors for diabetes, you should be screened once every 3 years starting at age 27. If you are overweight or obese and you are 57-14 years of age, you should be screened for diabetes every year as part of your cardiovascular risk assessment.  Breast cancer screening is essential preventive care for women. You should practice "breast self-awareness." This means understanding the normal appearance and feel of your breasts and may include breast self-examination. Any changes detected, no matter how small, should be reported to a health care provider. Women in their 18s and 30s should have a clinical breast exam (CBE) by a health care provider as part of a regular health exam every 1 to 3 years. After age 12, women should have a CBE every year. Starting at age 68, women should consider having a mammogram (breast X-ray test) every year. Women who have a family history of breast cancer should talk to their health care provider about genetic screening. Women at a high risk of breast cancer should talk to their health care providers about having an MRI and a mammogram every year.  Breast cancer gene (BRCA)-related cancer risk assessment is recommended for women who have family members with BRCA-related cancers. BRCA-related cancers include breast, ovarian, tubal, and peritoneal cancers. Having family members with these  cancers may be associated with an increased risk for harmful changes (mutations) in the breast cancer genes BRCA1 and BRCA2. Results of the assessment will determine the need for genetic counseling and BRCA1 and BRCA2 testing.  Your health care provider may recommend that you be screened regularly for cancer of the pelvic organs (ovaries, uterus, and vagina). This screening involves a pelvic examination, including checking for microscopic changes to the surface of your cervix (Pap test). You may be encouraged to have this screening done every 3 years, beginning at age 39.  For women ages 15-65, health care providers may recommend pelvic exams and Pap testing every 3 years, or they may recommend the Pap and pelvic exam, combined with testing for human papilloma virus (HPV), every 5 years. Some types of HPV increase your risk of cervical cancer. Testing for HPV may also be done on women of any age with unclear Pap test results.  Other health care providers may not recommend any screening for nonpregnant women who are  considered low risk for pelvic cancer and who do not have symptoms. Ask your health care provider if a screening pelvic exam is right for you.  If you have had past treatment for cervical cancer or a condition that could lead to cancer, you need Pap tests and screening for cancer for at least 20 years after your treatment. If Pap tests have been discontinued, your risk factors (such as having a new sexual partner) need to be reassessed to determine if screening should resume. Some women have medical problems that increase the chance of getting cervical cancer. In these cases, your health care provider may recommend more frequent screening and Pap tests.  Colorectal cancer can be detected and often prevented. Most routine colorectal cancer screening begins at the age of 62 years and continues through age 14 years. However, your health care provider may recommend screening at an earlier age if you  have risk factors for colon cancer. On a yearly basis, your health care provider may provide home test kits to check for hidden blood in the stool. Use of a small camera at the end of a tube, to directly examine the colon (sigmoidoscopy or colonoscopy), can detect the earliest forms of colorectal cancer. Talk to your health care provider about this at age 39, when routine screening begins. Direct exam of the colon should be repeated every 5-10 years through age 3 years, unless early forms of precancerous polyps or small growths are found.  People who are at an increased risk for hepatitis B should be screened for this virus. You are considered at high risk for hepatitis B if:  You were born in a country where hepatitis B occurs often. Talk with your health care provider about which countries are considered high risk.  Your parents were born in a high-risk country and you have not received a shot to protect against hepatitis B (hepatitis B vaccine).  You have HIV or AIDS.  You use needles to inject street drugs.  You live with, or have sex with, someone who has hepatitis B.  You get hemodialysis treatment.  You take certain medicines for conditions like cancer, organ transplantation, and autoimmune conditions.  Hepatitis C blood testing is recommended for all people born from [birth year] through 1965 and any individual with known risks for hepatitis C.  Practice safe sex. Use condoms and avoid high-risk sexual practices to reduce the spread of sexually transmitted infections (STIs). STIs include gonorrhea, chlamydia, syphilis, trichomonas, herpes, HPV, and human immunodeficiency virus (HIV). Herpes, HIV, and HPV are viral illnesses that have no cure. They can result in disability, cancer, and death.  You should be screened for sexually transmitted illnesses (STIs) including gonorrhea and chlamydia if:  You are sexually active and are younger than 24 years.  You are older than 24 years and your  health care provider tells you that you are at risk for this type of infection.  Your sexual activity has changed since you were last screened and you are at an increased risk for chlamydia or gonorrhea. Ask your health care provider if you are at risk.  If you are at risk of being infected with HIV, it is recommended that you take a prescription medicine daily to prevent HIV infection. This is called preexposure prophylaxis (PrEP). You are considered at risk if:  You are sexually active and do not regularly use condoms or know the HIV status of your partner(s).  You take drugs by injection.  You are sexually active with  a partner who has HIV.  Talk with your health care provider about whether you are at high risk of being infected with HIV. If you choose to begin PrEP, you should first be tested for HIV. You should then be tested every 3 months for as long as you are taking PrEP.  Osteoporosis is a disease in which the bones lose minerals and strength with aging. This can result in serious bone fractures or breaks. The risk of osteoporosis can be identified using a bone density scan. Women ages 71 years and over and women at risk for fractures or osteoporosis should discuss screening with their health care providers. Ask your health care provider whether you should take a calcium supplement or vitamin D to reduce the rate of osteoporosis.  Menopause can be associated with physical symptoms and risks. Hormone replacement therapy is available to decrease symptoms and risks. You should talk to your health care provider about whether hormone replacement therapy is right for you.  Use sunscreen. Apply sunscreen liberally and repeatedly throughout the day. You should seek shade when your shadow is shorter than you. Protect yourself by wearing long sleeves, pants, a wide-brimmed hat, and sunglasses year round, whenever you are outdoors.  Once a month, do a whole body skin exam, using a mirror to look  at the skin on your back. Tell your health care provider of new moles, moles that have irregular borders, moles that are larger than a pencil eraser, or moles that have changed in shape or color.  Stay current with required vaccines (immunizations).  Influenza vaccine. All adults should be immunized every year.  Tetanus, diphtheria, and acellular pertussis (Td, Tdap) vaccine. Pregnant women should receive 1 dose of Tdap vaccine during each pregnancy. The dose should be obtained regardless of the length of time since the last dose. Immunization is preferred during the 27th-36th week of gestation. An adult who has not previously received Tdap or who does not know her vaccine status should receive 1 dose of Tdap. This initial dose should be followed by tetanus and diphtheria toxoids (Td) booster doses every 10 years. Adults with an unknown or incomplete history of completing a 3-dose immunization series with Td-containing vaccines should begin or complete a primary immunization series including a Tdap dose. Adults should receive a Td booster every 10 years.  Varicella vaccine. An adult without evidence of immunity to varicella should receive 2 doses or a second dose if she has previously received 1 dose. Pregnant females who do not have evidence of immunity should receive the first dose after pregnancy. This first dose should be obtained before leaving the health care facility. The second dose should be obtained 4-8 weeks after the first dose.  Human papillomavirus (HPV) vaccine. Females aged 13-26 years who have not received the vaccine previously should obtain the 3-dose series. The vaccine is not recommended for use in pregnant females. However, pregnancy testing is not needed before receiving a dose. If a female is found to be pregnant after receiving a dose, no treatment is needed. In that case, the remaining doses should be delayed until after the pregnancy. Immunization is recommended for any person  with an immunocompromised condition through the age of 48 years if she did not get any or all doses earlier. During the 3-dose series, the second dose should be obtained 4-8 weeks after the first dose. The third dose should be obtained 24 weeks after the first dose and 16 weeks after the second dose.  Zoster vaccine.  One dose is recommended for adults aged 41 years or older unless certain conditions are present.  Measles, mumps, and rubella (MMR) vaccine. Adults born before [birth year] generally are considered immune to measles and mumps. Adults born in [birth year] or later should have 1 or more doses of MMR vaccine unless there is a contraindication to the vaccine or there is laboratory evidence of immunity to each of the three diseases. A routine second dose of MMR vaccine should be obtained at least 28 days after the first dose for students attending postsecondary schools, health care workers, or international travelers. People who received inactivated measles vaccine or an unknown type of measles vaccine during 1963-1967 should receive 2 doses of MMR vaccine. People who received inactivated mumps vaccine or an unknown type of mumps vaccine before 1979 and are at high risk for mumps infection should consider immunization with 2 doses of MMR vaccine. For females of childbearing age, rubella immunity should be determined. If there is no evidence of immunity, females who are not pregnant should be vaccinated. If there is no evidence of immunity, females who are pregnant should delay immunization until after pregnancy. Unvaccinated health care workers born before [birth year] who lack laboratory evidence of measles, mumps, or rubella immunity or laboratory confirmation of disease should consider measles and mumps immunization with 2 doses of MMR vaccine or rubella immunization with 1 dose of MMR vaccine.  Pneumococcal 13-valent conjugate (PCV13) vaccine. When indicated, a person who is uncertain of his immunization history and has  no record of immunization should receive the PCV13 vaccine. All adults 64 years of age and older should receive this vaccine. An adult aged 26 years or older who has certain medical conditions and has not been previously immunized should receive 1 dose of PCV13 vaccine. This PCV13 should be followed with a dose of pneumococcal polysaccharide (PPSV23) vaccine. Adults who are at high risk for pneumococcal disease should obtain the PPSV23 vaccine at least 8 weeks after the dose of PCV13 vaccine. Adults older than 59 years of age who have normal immune system function should obtain the PPSV23 vaccine dose at least 1 year after the dose of PCV13 vaccine.  Pneumococcal polysaccharide (PPSV23) vaccine. When PCV13 is also indicated, PCV13 should be obtained first. All adults aged 7 years and older should be immunized. An adult younger than age 25 years who has certain medical conditions should be immunized. Any person who resides in a nursing home or long-term care facility should be immunized. An adult smoker should be immunized. People with an immunocompromised condition and certain other conditions should receive both PCV13 and PPSV23 vaccines. People with human immunodeficiency virus (HIV) infection should be immunized as soon as possible after diagnosis. Immunization during chemotherapy or radiation therapy should be avoided. Routine use of PPSV23 vaccine is not recommended for American Indians, [place] Natives, or people younger than 65 years unless there are medical conditions that require PPSV23 vaccine. When indicated, people who have unknown immunization and have no record of immunization should receive PPSV23 vaccine. One-time revaccination 5 years after the first dose of PPSV23 is recommended for people aged 19-64 years who have chronic kidney failure, nephrotic syndrome, asplenia, or immunocompromised conditions. People who received 1-2 doses of PPSV23 before age 45 years should receive another dose of PPSV23  vaccine at age 18 years or later if at least 5 years have passed since the previous dose. Doses of PPSV23 are not needed for people immunized with PPSV23 at or after age 90 years.  Meningococcal vaccine. Adults with asplenia or persistent complement component deficiencies should receive 2 doses of quadrivalent meningococcal conjugate (MenACWY-D) vaccine. The doses should be obtained at least 2 months apart. Microbiologists working with certain meningococcal bacteria, [place] recruits, people at risk during an outbreak, and people who travel to or live in countries with a high rate of meningitis should be immunized. A first-year college student up through age 68 years who is living in a residence hall should receive a dose if she did not receive a dose on or after her 16th birthday. Adults who have certain high-risk conditions should receive one or more doses of vaccine.  Hepatitis A vaccine. Adults who wish to be protected from this disease, have certain high-risk conditions, work with hepatitis A-infected animals, work in hepatitis A research labs, or travel to or work in countries with a high rate of hepatitis A should be immunized. Adults who were previously unvaccinated and who anticipate close contact with an international adoptee during the first 60 days after arrival in the [place] from a country with a high rate of hepatitis A should be immunized.  Hepatitis B vaccine. Adults who wish to be protected from this disease, have certain high-risk conditions, may be exposed to blood or other infectious body fluids, are household contacts or sex partners of hepatitis B positive people, are clients or workers in certain care facilities, or travel to or work in countries with a high rate of hepatitis B should be immunized.  Haemophilus influenzae type b (Hib) vaccine. A previously unvaccinated person with asplenia or sickle cell disease or having a scheduled splenectomy should receive 1 dose of Hib  vaccine. Regardless of previous immunization, a recipient of a hematopoietic stem cell transplant should receive a 3-dose series 6-12 months after her successful transplant. Hib vaccine is not recommended for adults with HIV infection. Preventive Services / Frequency Ages 70 to 15 years  Blood pressure check.** / Every 3-5 years.  Lipid and cholesterol check.** / Every 5 years beginning at age 75.  Clinical breast exam.** / Every 3 years for women in their 55s and 12s.  BRCA-related cancer risk assessment.** / For women who have family members with a BRCA-related cancer (breast, ovarian, tubal, or peritoneal cancers).  Pap test.** / Every 2 years from ages 56 through 61. Every 3 years starting at age 74 through age 63 or 62 with a history of 3 consecutive normal Pap tests.  HPV screening.** / Every 3 years from ages 93 through ages 57 to 76 with a history of 3 consecutive normal Pap tests.  Hepatitis C blood test.** / For any individual with known risks for hepatitis C.  Skin self-exam. / Monthly.  Influenza vaccine. / Every year.  Tetanus, diphtheria, and acellular pertussis (Tdap, Td) vaccine.** / Consult your health care provider. Pregnant women should receive 1 dose of Tdap vaccine during each pregnancy. 1 dose of Td every 10 years.  Varicella vaccine.** / Consult your health care provider. Pregnant females who do not have evidence of immunity should receive the first dose after pregnancy.  HPV vaccine. / 3 doses over 6 months, if 66 and younger. The vaccine is not recommended for use in pregnant females. However, pregnancy testing is not needed before receiving a dose.  Measles, mumps, rubella (MMR) vaccine.** / You need at least 1 dose of MMR if you were born in 1957 or later. You may also need a 2nd dose. For females of childbearing age, rubella immunity should be determined.  If there is no evidence of immunity, females who are not pregnant should be vaccinated. If there is no  evidence of immunity, females who are pregnant should delay immunization until after pregnancy.  Pneumococcal 13-valent conjugate (PCV13) vaccine.** / Consult your health care provider.  Pneumococcal polysaccharide (PPSV23) vaccine.** / 1 to 2 doses if you smoke cigarettes or if you have certain conditions.  Meningococcal vaccine.** / 1 dose if you are age 76 to 82 years and a Market researcher living in a residence hall, or have one of several medical conditions, you need to get vaccinated against meningococcal disease. You may also need additional booster doses.  Hepatitis A vaccine.** / Consult your health care provider.  Hepatitis B vaccine.** / Consult your health care provider.  Haemophilus influenzae type b (Hib) vaccine.** / Consult your health care provider. Ages 26 to 48 years  Blood pressure check.** / Every year.  Lipid and cholesterol check.** / Every 5 years beginning at age 63 years.  Lung cancer screening. / Every year if you are aged 29-80 years and have a 30-pack-year history of smoking and currently smoke or have quit within the past 15 years. Yearly screening is stopped once you have quit smoking for at least 15 years or develop a health problem that would prevent you from having lung cancer treatment.  Clinical breast exam.** / Every year after age 45 years.  BRCA-related cancer risk assessment.** / For women who have family members with a BRCA-related cancer (breast, ovarian, tubal, or peritoneal cancers).  Mammogram.** / Every year beginning at age 72 years and continuing for as long as you are in good health. Consult with your health care provider.  Pap test.** / Every 3 years starting at age 54 years through age 78 or 51 years with a history of 3 consecutive normal Pap tests.  HPV screening.** / Every 3 years from ages 65 years through ages 56 to 70 years with a history of 3 consecutive normal Pap tests.  Fecal occult blood test (FOBT) of stool. /  Every year beginning at age 51 years and continuing until age 65 years. You may not need to do this test if you get a colonoscopy every 10 years.  Flexible sigmoidoscopy or colonoscopy.** / Every 5 years for a flexible sigmoidoscopy or every 10 years for a colonoscopy beginning at age 49 years and continuing until age 17 years.  Hepatitis C blood test.** / For all people born from [birth year] through 1964 and any individual with known risks for hepatitis C.  Skin self-exam. / Monthly.  Influenza vaccine. / Every year.  Tetanus, diphtheria, and acellular pertussis (Tdap/Td) vaccine.** / Consult your health care provider. Pregnant women should receive 1 dose of Tdap vaccine during each pregnancy. 1 dose of Td every 10 years.  Varicella vaccine.** / Consult your health care provider. Pregnant females who do not have evidence of immunity should receive the first dose after pregnancy.  Zoster vaccine.** / 1 dose for adults aged 26 years or older.  Measles, mumps, rubella (MMR) vaccine.** / You need at least 1 dose of MMR if you were born in 1957 or later. You may also need a second dose. For females of childbearing age, rubella immunity should be determined. If there is no evidence of immunity, females who are not pregnant should be vaccinated. If there is no evidence of immunity, females who are pregnant should delay immunization until after pregnancy.  Pneumococcal 13-valent conjugate (PCV13) vaccine.** / Consult your health care  provider.  Pneumococcal polysaccharide (PPSV23) vaccine.** / 1 to 2 doses if you smoke cigarettes or if you have certain conditions.  Meningococcal vaccine.** / Consult your health care provider.  Hepatitis A vaccine.** / Consult your health care provider.  Hepatitis B vaccine.** / Consult your health care provider.  Haemophilus influenzae type b (Hib) vaccine.** / Consult your health care provider. Ages 65 years and over  Blood pressure check.** / Every year.  Lipid  and cholesterol check.** / Every 5 years beginning at age 27 years.  Lung cancer screening. / Every year if you are aged 55-80 years and have a 30-pack-year history of smoking and currently smoke or have quit within the past 15 years. Yearly screening is stopped once you have quit smoking for at least 15 years or develop a health problem that would prevent you from having lung cancer treatment.  Clinical breast exam.** / Every year after age 47 years.  BRCA-related cancer risk assessment.** / For women who have family members with a BRCA-related cancer (breast, ovarian, tubal, or peritoneal cancers).  Mammogram.** / Every year beginning at age 50 years and continuing for as long as you are in good health. Consult with your health care provider.  Pap test.** / Every 3 years starting at age 39 years through age 71 or 46 years with 3 consecutive normal Pap tests. Testing can be stopped between 65 and 70 years with 3 consecutive normal Pap tests and no abnormal Pap or HPV tests in the past 10 years.  HPV screening.** / Every 3 years from ages 48 years through ages 22 or 58 years with a history of 3 consecutive normal Pap tests. Testing can be stopped between 65 and 70 years with 3 consecutive normal Pap tests and no abnormal Pap or HPV tests in the past 10 years.  Fecal occult blood test (FOBT) of stool. / Every year beginning at age 40 years and continuing until age 98 years. You may not need to do this test if you get a colonoscopy every 10 years.  Flexible sigmoidoscopy or colonoscopy.** / Every 5 years for a flexible sigmoidoscopy or every 10 years for a colonoscopy beginning at age 59 years and continuing until age 29 years.  Hepatitis C blood test.** / For all people born from [birth year] through 1964 and any individual with known risks for hepatitis C.  Osteoporosis screening.** / A one-time screening for women ages 66 years and over and women at risk for fractures or osteoporosis.  Skin  self-exam. / Monthly.  Influenza vaccine. / Every year.  Tetanus, diphtheria, and acellular pertussis (Tdap/Td) vaccine.** / 1 dose of Td every 10 years.  Varicella vaccine.** / Consult your health care provider.  Zoster vaccine.** / 1 dose for adults aged 65 years or older.  Pneumococcal 13-valent conjugate (PCV13) vaccine.** / Consult your health care provider.  Pneumococcal polysaccharide (PPSV23) vaccine.** / 1 dose for all adults aged 77 years and older.  Meningococcal vaccine.** / Consult your health care provider.  Hepatitis A vaccine.** / Consult your health care provider.  Hepatitis B vaccine.** / Consult your health care provider.  Haemophilus influenzae type b (Hib) vaccine.** / Consult your health care provider. ** Family history and personal history of risk and conditions may change your health care provider's recommendations.   This information is not intended to replace advice given to you by your health care provider. Make sure you discuss any questions you have with your health care provider.   Document Released: 09/25/2001  Document Revised: 08/20/2014 Document Reviewed: 12/25/2010 Elsevier Interactive Patient Education [employer].

## 2015-09-20 NOTE — Progress Notes (Signed)
Subjective:    Patient ID: Lori Zamora, female    DOB: 02-14-1956, 60 y.o.   MRN: [id number]  Chief Complaint  Patient presents with  . Annual Exam    HPI Patient is in today for annual exam. Is following with numerous specialists. Sees endocrinology with [facility] Dr. Buddy Duty. Reports good response to a switch to Jardine's medication. Denies polyuria or polydipsia. Continues to follow with Dr. Clydene Laming at [facility] gastroenterology and still has episodes of gastroparesis at times. Follows with Dr. Matthew Saras of gynecology for paps every third year. Continues to have some heartburn at times. Denies CP/palp/SOB/HA/congestion/fevers/GI or GU c/o. Taking meds as prescribed  Past Medical History  Diagnosis Date  . Unspecified sleep apnea   . Acquired acanthosis nigricans   . Unspecified vitamin D deficiency   . Bipolar disorder, unspecified ([place])   . Degeneration of intervertebral disc, site unspecified   . Other thalassemia ([place])   . Other specified iron deficiency anemias   . Esophageal reflux   . Systemic lupus erythematosus ([place])   . Unspecified constipation   . Migraine with aura, without mention of intractable migraine without mention of status migrainosus   . Anemia, unspecified   . Nontoxic multinodular goiter   . Myalgia and myositis, unspecified   . Other malaise and fatigue   . Unspecified essential hypertension   . Other and unspecified hyperlipidemia   . Type II or unspecified type diabetes mellitus without mention of complication, not stated as uncontrolled   . Depressive disorder, not elsewhere classified   . 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 Sjogren's syndrome ([place])   . CFS (chronic fatigue syndrome)   . Unspecified diffuse connective tissue disease 01/27/2013    Follows at [facility] rheumatology, Dr Alanda Amass Per patient has tested 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 antibody  . Renal insufficiency  01/27/2013  . Pelvic floor dysfunction 01/27/2013  . Microcytic anemia 01/31/2010    Qualifier: Diagnosis of  By: Fuller Plan CMA (AAMA), [place]    . Bipolar 2 disorder ([place]) 01/31/2010    Qualifier: Diagnosis of  By: Fuller Plan CMA (AAMA), Terri Skains  Follows with Dr Letta Moynahan of psychiatry   . Intertriginous candidiasis 11/29/2013  . Small intestinal bacterial overgrowth 11/29/2013  . 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 Abdominal pain, unspecified site 11/29/2013  . Preventative health care 09/12/2014  . Gastroparesis 06/07/2015    Past Surgical History  Procedure Laterality Date  . Laparoscopic total hysterectomy  2004  . Biopsy thyroid      01/13/2015, 3 previous biopsy reported all benign    Family History  Problem Relation Age of Onset  . Diabetes Mother   . Heart disease Mother     family history  . Allergies Mother   . Obesity Sister   . Alcohol abuse      Family history of alcoholism and addiction  . Hypertension      family history  . Kidney disease      family history  . Stroke      1st degree relative <60  . Alcohol abuse Brother     drug abuse  . Heart disease Maternal Grandmother   . Heart disease Maternal Grandfather     Social History   Social History  . Marital Status: Single    Spouse Name: N/A  . Number of Children: 0  . Years of Education: N/A   Occupational History  .  Teacher    Social History Main Topics  . Smoking status: Never Smoker   . Smokeless tobacco: Never Used  . Alcohol Use: No  . Drug Use: No  . Sexual Activity: No     Comment: lives alone, no major dietary restrictions, retired from teaching kindergarten   Other Topics Concern  . 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 amLODipine (NORVASC) 5 MG tablet Take 5 mg by mouth daily.    Marland Kitchen atorvastatin (LIPITOR) 20 MG tablet TAKE 1 TABLET  BY MOUTH THREE TIMES DAILY 270 tablet 0  . atorvastatin (LIPITOR) 20 MG tablet Take 1 tablet (20 mg total) by mouth 3 (three) times daily. 270 tablet 2  . cholecalciferol (VITAMIN D) 1000 UNITS tablet Take 2,000 Units by mouth daily.     . diphenhydrAMINE (BENADRYL) 25 MG tablet Take 25 mg by mouth as needed.    Marland Kitchen glucose blood test strip 1 each by Other route daily. One Touch Ultra     . ibuprofen (ADVIL) 200 MG tablet Take 200 mg by mouth every 6 (six) hours as needed.      Marland Kitchen ketoconazole (NIZORAL) 2 % cream Apply 1 application topically daily. 60 g 6  . lansoprazole (PREVACID 24HR) 15 MG capsule TAKE 2 CAPSULES BY MOUTH TWICE DAILY BEFORE A MEAL (Patient taking differently: TAKE 2 CAPSULES BY MOUTH ONCE DAILY BEFORE A MEAL) 360 capsule 2  . Lidocaine, Anorectal, 5 % CREA Apply topically as needed.    . Magnesium Oxide 500 MG (LAX) TABS Take by mouth daily. Take 1-4 tablets daily.    . metFORMIN (GLUCOPHAGE) 500 MG tablet Take 1,000 mg by mouth 2 (two) times daily with a meal.     . NON FORMULARY Lubricant eye ointment- sterile mineral oil 39.9% White Petroleum 57.7%    . NON FORMULARY Biotene dry mouth oral rinse    . Polyethyl Glycol-Propyl Glycol (SYSTANE) 0.4-0.3 % GEL Apply to eye.    Marland Kitchen PRESCRIPTION MEDICATION prevident 5000- dry mouth toothpaste    . Probiotic Product (DIGESTIVE ADVANTAGE GUMMIES PO) Take by mouth 2 (two) times daily.    Marland Kitchen RA SUNSCREEN SPF50 EX Apply topically as needed.      . valsartan (DIOVAN) 80 MG tablet Take 1 tablet (80 mg total) by mouth daily. 90 tablet 2  . canagliflozin (INVOKANA) 100 MG TABS tablet Take 100 mg by mouth daily before breakfast.    . DESONATE 0.05 % gel APPLY TOPICALLY TWICE DAILY 60 g 0   No facility-administered medications prior to visit.    Allergies  Allergen Reactions  . Acetazolamide   . Azathioprine   . Ezetimibe   . Latex   . Lisinopril   . Meloxicam   . Penicillins   . Pravastatin Sodium   . Simvastatin   . Sulfonamide  Derivatives     REACTION: Swelling of tongue and face    Review of Systems  Constitutional: Positive for malaise/fatigue. Negative for fever and chills.  HENT: Negative for congestion and hearing loss.   Eyes: Negative for discharge.  Respiratory: Negative for cough, sputum production and shortness of breath.   Cardiovascular: Negative for chest pain, palpitations and leg swelling.  Gastrointestinal: Positive for nausea and vomiting. Negative for heartburn, abdominal pain, diarrhea, constipation and blood in stool.  Genitourinary: Negative for dysuria, urgency, frequency and  hematuria.  Musculoskeletal: Negative for myalgias, back pain and falls.  Skin: Negative for rash.  Neurological: Negative for dizziness, sensory change, loss of consciousness, weakness and headaches.  Endo/Heme/Allergies: Negative for environmental allergies. Does not bruise/bleed easily.  Psychiatric/Behavioral: Negative for depression and suicidal ideas. The patient is nervous/anxious. The patient does not have insomnia.        Objective:    Physical Exam  Constitutional: She is oriented to person, place, and time. She appears well-developed and well-nourished. No distress.  HENT:  Head: Normocephalic and atraumatic.  Eyes: Conjunctivae are normal.  Neck: Neck supple. No thyromegaly present.  Cardiovascular: Regular rhythm and normal heart sounds.   No murmur heard. tachycardia  Pulmonary/Chest: Effort normal and breath sounds normal. No respiratory distress.  Abdominal: Soft. Bowel sounds are normal. She exhibits no distension and no mass. There is no tenderness.  Musculoskeletal: She exhibits no edema.  Lymphadenopathy:    She has no cervical adenopathy.  Neurological: She is alert and oriented to person, place, and time.  Skin: Skin is warm and dry.  Psychiatric: She has a normal mood and affect. Her behavior is normal.    BP 118/72 mmHg  Pulse 94  Temp(Src) 98.7 F (37.1 C) (Oral)  Ht 5\' 5"   (1.651 m)  Wt 176 lb 6 oz (80.003 kg)  BMI 29.35 kg/m2  SpO2 93% Wt Readings from Last 3 Encounters:  09/20/15 176 lb 6 oz (80.003 kg)  08/03/15 180 lb (81.647 kg)  06/07/15 183 lb 2 oz (83.065 kg)     Lab Results  Component Value Date   WBC 10.4 06/07/2015   HGB 12.3 06/07/2015   HCT 39.3 06/07/2015   PLT 500.0* 06/07/2015   GLUCOSE 77 06/07/2015   CHOL 160 06/07/2015   TRIG 154.0* 06/07/2015   HDL 34.30* 06/07/2015   LDLDIRECT 95 12/22/2009   LDLCALC 95 06/07/2015   ALT 16 06/07/2015   AST 17 06/07/2015   NA 142 06/07/2015   K 4.6 06/07/2015   CL 105 06/07/2015   CREATININE 0.86 06/07/2015   BUN 15 06/07/2015   CO2 26 06/07/2015   TSH 1.067 11/26/2013   HGBA1C 5.6 01/11/2014   MICROALBUR 4.05* 03/22/2014    Lab Results  Component Value Date   TSH 1.067 11/26/2013   Lab Results  Component Value Date   WBC 10.4 06/07/2015   HGB 12.3 06/07/2015   HCT 39.3 06/07/2015   MCV 78.9 06/07/2015   PLT 500.0* 06/07/2015   Lab Results  Component Value Date   NA 142 06/07/2015   K 4.6 06/07/2015   CO2 26 06/07/2015   GLUCOSE 77 06/07/2015   BUN 15 06/07/2015   CREATININE 0.86 06/07/2015   BILITOT 0.3 06/07/2015   ALKPHOS 120* 06/07/2015   AST 17 06/07/2015   ALT 16 06/07/2015   PROT 7.6 06/07/2015   ALBUMIN 4.3 06/07/2015   CALCIUM 10.4 06/07/2015   ANIONGAP 13.4 12/22/2009   GFR 87.05 06/07/2015   Lab Results  Component Value Date   CHOL 160 06/07/2015   Lab Results  Component Value Date   HDL 34.30* 06/07/2015   Lab Results  Component Value Date   LDLCALC 95 06/07/2015   Lab Results  Component Value Date   TRIG 154.0* 06/07/2015   Lab Results  Component Value Date   CHOLHDL 5 06/07/2015   Lab Results  Component Value Date   HGBA1C 5.6 01/11/2014       Assessment & Plan:   Problem List Items Addressed  This Visit    Bipolar 2 disorder ([place])    Stable follows with Dr Tomasita Crumble      Diabetes mellitus type 2 in nonobese [facility])     Follows with Dr Buddy Duty at [facility] endocrinology, doing well      Relevant Medications   empagliflozin (JARDIANCE) 10 MG TABS tablet   Essential hypertension    Well controlled, no changes to meds. Encouraged heart healthy diet such as the DASH diet and exercise as tolerated.       Gastroparesis    Stable on Lansoprazole but insurance no longer covers, will switch to Omeprazole 20 mg capsules per formulary when her supply of Prevacid is gone she will notify us when it is time      Preventative health care    Patient encouraged to maintain heart healthy diet, regular exercise, adequate sleep. Consider daily probiotics. Take medications as prescribed. Reviewed external labs.        Other Visit Diagnoses    Aortic valve disorder    -  Primary    Relevant Orders    Echocardiogram       I have discontinued Ms. Engebretsen's DESONATE and canagliflozin. I am also having her maintain her metFORMIN, cholecalciferol, glucose blood, RA SUNSCREEN SPF50 EX, ibuprofen, Magnesium Oxide, diphenhydrAMINE, Lidocaine (Anorectal), PRESCRIPTION MEDICATION, NON FORMULARY, NON FORMULARY, Polyethyl Glycol-Propyl Glycol, valsartan, lansoprazole, ketoconazole, atorvastatin, Probiotic Product (DIGESTIVE ADVANTAGE GUMMIES PO), atorvastatin, amLODipine, and empagliflozin.  Meds ordered this encounter  Medications  . empagliflozin (JARDIANCE) 10 MG TABS tablet    Sig: Take 10 mg by mouth daily.     [name], MD

## 2015-09-21 ENCOUNTER — Encounter: Payer: Self-pay | Admitting: Family Medicine

## 2015-09-25 NOTE — Assessment & Plan Note (Signed)
Stable follows with Dr Tomasita Crumble

## 2015-09-25 NOTE — Assessment & Plan Note (Signed)
Patient encouraged to maintain heart healthy diet, regular exercise, adequate sleep. Consider daily probiotics. Take medications as prescribed. Reviewed external labs.

## 2015-09-25 NOTE — Assessment & Plan Note (Signed)
Follows with Dr Buddy Duty at [facility] endocrinology, doing well

## 2015-09-25 NOTE — Assessment & Plan Note (Signed)
Well controlled, no changes to meds. Encouraged heart healthy diet such as the DASH diet and exercise as tolerated.  °

## 2015-09-26 ENCOUNTER — Telehealth: Payer: Self-pay | Admitting: Family Medicine

## 2015-09-26 DIAGNOSIS — D75839 Thrombocytosis, unspecified: Secondary | ICD-10-CM

## 2015-09-26 DIAGNOSIS — D473 Essential (hemorrhagic) thrombocythemia: Secondary | ICD-10-CM

## 2015-09-26 NOTE — Telephone Encounter (Signed)
Called the patient and she would prefer for PCP to order further blood test.

## 2015-09-26 NOTE — Telephone Encounter (Signed)
Please order labs: ferritin, crp, sed rate, cbc with diff, peripheral blood smear and plasma fibrinogen for thrombocytosis

## 2015-09-26 NOTE — Telephone Encounter (Signed)
-----   Message from Mosie Lukes, MD sent at 09/25/2015  9:55 PM EST ----- I was reviewing her labs and her platelets have gone up some I would like to refer her to hematology for further evaluation if she is OK with that. If not i at least need to run some further labs.

## 2015-09-27 ENCOUNTER — Other Ambulatory Visit (INDEPENDENT_AMBULATORY_CARE_PROVIDER_SITE_OTHER): Payer: BC Managed Care – PPO

## 2015-09-27 ENCOUNTER — Telehealth: Payer: Self-pay | Admitting: *Deleted

## 2015-09-27 DIAGNOSIS — D473 Essential (hemorrhagic) thrombocythemia: Secondary | ICD-10-CM | POA: Diagnosis not present

## 2015-09-27 DIAGNOSIS — D75839 Thrombocytosis, unspecified: Secondary | ICD-10-CM

## 2015-09-27 LAB — FERRITIN: Ferritin: 22.1 ng/mL (ref 10.0–291.0)

## 2015-09-27 LAB — CBC WITH DIFFERENTIAL/PLATELET
BASOS PCT: 0.4 % (ref 0.0–3.0)
Basophils Absolute: 0 10*3/uL (ref 0.0–0.1)
EOS PCT: 2 % (ref 0.0–5.0)
Eosinophils Absolute: 0.1 10*3/uL (ref 0.0–0.7)
HEMATOCRIT: 36.8 % (ref 36.0–46.0)
HEMOGLOBIN: 11.8 g/dL — AB (ref 12.0–15.0)
LYMPHS PCT: 32.3 % (ref 12.0–46.0)
Lymphs Abs: 2.2 10*3/uL (ref 0.7–4.0)
MCHC: 32 g/dL (ref 30.0–36.0)
MCV: 77.4 fl — ABNORMAL LOW (ref 78.0–100.0)
Monocytes Absolute: 0.3 10*3/uL (ref 0.1–1.0)
Monocytes Relative: 4.8 % (ref 3.0–12.0)
Neutro Abs: 4.1 10*3/uL (ref 1.4–7.7)
Neutrophils Relative %: 60.5 % (ref 43.0–77.0)
Platelets: 473 10*3/uL — ABNORMAL HIGH (ref 150.0–400.0)
RBC: 4.75 Mil/uL (ref 3.87–5.11)
RDW: 17.5 % — ABNORMAL HIGH (ref 11.5–15.5)
WBC: 6.8 10*3/uL (ref 4.0–10.5)

## 2015-09-27 LAB — SEDIMENTATION RATE: SED RATE: 36 mm/h — AB (ref 0–22)

## 2015-09-27 LAB — C-REACTIVE PROTEIN: CRP: 0.5 mg/dL (ref 0.5–20.0)

## 2015-09-27 NOTE — Telephone Encounter (Signed)
Labs ordered.

## 2015-09-27 NOTE — Telephone Encounter (Signed)
[facility] lab reporting results for save smear --- normal . Unable to result in epic.

## 2015-09-27 NOTE — Telephone Encounter (Signed)
Notify peripheral blood smear was normal this is good

## 2015-09-27 NOTE — Telephone Encounter (Signed)
Patient aware labs ordered and she scheduled lab appt. For today at 8:45 am 09/27/2015

## 2015-09-28 LAB — FIBRINOGEN: Fibrinogen: 549 mg/dL — ABNORMAL HIGH (ref 204–475)

## 2015-09-28 NOTE — Telephone Encounter (Signed)
Called pt, phone busy.  Will try again later.

## 2015-09-29 NOTE — Telephone Encounter (Signed)
Patient informed of results.  

## 2015-10-04 ENCOUNTER — Telehealth (HOSPITAL_COMMUNITY): Payer: Self-pay | Admitting: Family Medicine

## 2015-10-05 ENCOUNTER — Ambulatory Visit (HOSPITAL_BASED_OUTPATIENT_CLINIC_OR_DEPARTMENT_OTHER)
Admission: RE | Admit: 2015-10-05 | Discharge: 2015-10-05 | Disposition: A | Discharge status: Home / Self Care | Payer: BC Managed Care – PPO | Source: Ambulatory Visit | Attending: Family Medicine | Admitting: Family Medicine

## 2015-10-05 DIAGNOSIS — I358 Other nonrheumatic aortic valve disorders: Secondary | ICD-10-CM | POA: Diagnosis not present

## 2015-10-05 DIAGNOSIS — E119 Type 2 diabetes mellitus without complications: Secondary | ICD-10-CM | POA: Diagnosis not present

## 2015-10-05 DIAGNOSIS — I359 Nonrheumatic aortic valve disorder, unspecified: Secondary | ICD-10-CM

## 2015-10-05 DIAGNOSIS — I340 Nonrheumatic mitral (valve) insufficiency: Secondary | ICD-10-CM | POA: Insufficient documentation

## 2015-10-05 DIAGNOSIS — I10 Essential (primary) hypertension: Secondary | ICD-10-CM | POA: Diagnosis not present

## 2015-10-05 NOTE — Progress Notes (Signed)
  Echocardiogram 2D Echocardiogram has been performed.  Donata Clay 10/05/2015, 11:26 AM

## 2015-10-11 LAB — HEMOGLOBIN A1C: HEMOGLOBIN A1C: 6.1

## 2015-11-23 ENCOUNTER — Encounter: Payer: Self-pay | Admitting: Family Medicine

## 2015-11-23 MED ORDER — OMEPRAZOLE 20 MG PO CPDR: 20.0000 mg | DELAYED_RELEASE_CAPSULE | Freq: Every day | ORAL | Status: DC

## 2015-11-23 MED ORDER — AMLODIPINE BESYLATE 5 MG PO TABS: ORAL_TABLET | ORAL | Status: DC

## 2015-11-23 NOTE — Telephone Encounter (Signed)
Rx was sent to correct pharmacy by Mackie Pai.

## 2015-11-23 NOTE — Telephone Encounter (Signed)
[name]. I sent the almodipine and omeprazole to wrong pharmacy. Will you resend. Thanks.

## 2015-11-23 NOTE — Telephone Encounter (Signed)
Sent in rx amlodipine and omeprazole.

## 2015-12-04 ENCOUNTER — Other Ambulatory Visit: Payer: Self-pay | Admitting: Family Medicine

## 2015-12-05 ENCOUNTER — Other Ambulatory Visit: Payer: Self-pay | Admitting: Family Medicine

## 2015-12-05 MED ORDER — ATORVASTATIN CALCIUM 20 MG PO TABS: 20.0000 mg | ORAL_TABLET | Freq: Three times a day (TID) | ORAL | Status: DC

## 2016-01-20 ENCOUNTER — Ambulatory Visit (INDEPENDENT_AMBULATORY_CARE_PROVIDER_SITE_OTHER): Payer: BC Managed Care – PPO | Admitting: Family Medicine

## 2016-01-20 VITALS — BP 102/68 | HR 92 | Temp 98.7°F | Ht 65.0 in | Wt 177.0 lb

## 2016-01-20 DIAGNOSIS — L299 Pruritus, unspecified: Secondary | ICD-10-CM

## 2016-01-20 DIAGNOSIS — K3184 Gastroparesis: Secondary | ICD-10-CM

## 2016-01-20 DIAGNOSIS — E119 Type 2 diabetes mellitus without complications: Secondary | ICD-10-CM | POA: Diagnosis not present

## 2016-01-20 DIAGNOSIS — I10 Essential (primary) hypertension: Secondary | ICD-10-CM

## 2016-01-20 DIAGNOSIS — E782 Mixed hyperlipidemia: Secondary | ICD-10-CM

## 2016-01-20 LAB — COMPREHENSIVE METABOLIC PANEL
ALBUMIN: 4.5 g/dL (ref 3.5–5.2)
ALT: 14 U/L (ref 0–35)
AST: 13 U/L (ref 0–37)
Alkaline Phosphatase: 116 U/L (ref 39–117)
BILIRUBIN TOTAL: 0.3 mg/dL (ref 0.2–1.2)
BUN: 15 mg/dL (ref 6–23)
CHLORIDE: 102 meq/L (ref 96–112)
CO2: 28 meq/L (ref 19–32)
CREATININE: 0.92 mg/dL (ref 0.40–1.20)
Calcium: 10.2 mg/dL (ref 8.4–10.5)
GFR: 80.36 mL/min (ref >60.00)
Glucose, Bld: 96 mg/dL (ref 70–99)
Potassium: 4.3 mEq/L (ref 3.5–5.1)
SODIUM: 139 meq/L (ref 135–145)
Total Protein: 8 g/dL (ref 6.0–8.3)

## 2016-01-20 LAB — CBC
HEMATOCRIT: 39.1 % (ref 36.0–46.0)
Hemoglobin: 12.4 g/dL (ref 12.0–15.0)
MCHC: 31.6 g/dL (ref 30.0–36.0)
MCV: 75.9 fl — AB (ref 78.0–100.0)
Platelets: 542 10*3/uL — ABNORMAL HIGH (ref 150.0–400.0)
RBC: 5.15 Mil/uL — ABNORMAL HIGH (ref 3.87–5.11)
RDW: 18.1 % — AB (ref 11.5–15.5)
WBC: 8.3 10*3/uL (ref 4.0–10.5)

## 2016-01-20 LAB — LIPID PANEL
CHOL/HDL RATIO: 4
CHOLESTEROL: 158 mg/dL (ref 0–200)
HDL: 39 mg/dL — ABNORMAL LOW (ref >39.00)
LDL CALC: 97 mg/dL (ref 0–99)
NonHDL: 118.5
TRIGLYCERIDES: 110 mg/dL (ref 0.0–149.0)
VLDL: 22 mg/dL (ref 0.0–40.0)

## 2016-01-20 MED ORDER — OMEPRAZOLE 20 MG PO CPDR: 20.0000 mg | DELAYED_RELEASE_CAPSULE | Freq: Every day | ORAL | Status: DC

## 2016-01-20 MED ORDER — VALSARTAN 40 MG PO TABS
40.0000 mg | ORAL_TABLET | Freq: Every day | ORAL | Status: DC
Start: 2016-01-20 — End: 2016-03-13

## 2016-01-20 MED ORDER — AMLODIPINE BESYLATE 5 MG PO TABS: ORAL_TABLET | ORAL | Status: DC

## 2016-01-20 MED ORDER — METOPROLOL SUCCINATE ER 25 MG PO TB24
25.0000 mg | ORAL_TABLET | Freq: Every day | ORAL | Status: DC
Start: 2016-01-20 — End: 2016-02-21

## 2016-01-20 NOTE — Assessment & Plan Note (Addendum)
Well controlled, no changes to meds. Encouraged heart healthy diet such as the DASH diet and exercise as tolerated. Metoprolol XL 25 mg daily, Amlodipine 5 mg po bid.

## 2016-01-20 NOTE — Patient Instructions (Addendum)
Consider Jason's or Alba lotions for sensitive skin without perfumes   Nonspecific Tachycardia Tachycardia is a faster than normal heartbeat (more than 100 beats per minute). In adults, the heart normally beats between 60 and 100 times a minute. A fast heartbeat may be a normal response to exercise or stress. It does not necessarily mean that something is wrong. However, sometimes when your heart beats too fast it may not be able to pump enough blood to the rest of your body. This can result in chest pain, shortness of breath, dizziness, and even fainting. Nonspecific tachycardia means that the specific cause or pattern of your tachycardia is unknown. CAUSES  Tachycardia may be harmless or it may be due to a more serious underlying cause. Possible causes of tachycardia include:  Exercise or exertion.  Fever.  Pain or injury.  Infection.  Loss of body fluids (dehydration).  Overactive thyroid.  Lack of red blood cells (anemia).  Anxiety and stress.  Alcohol.  Caffeine.  Tobacco products.  Diet pills.  Illegal drugs.  Heart disease. SYMPTOMS  Rapid or irregular heartbeat (palpitations).  Suddenly feeling your heart beating (cardiac awareness).  Dizziness.  Tiredness (fatigue).  Shortness of breath.  Chest pain.  Nausea.  Fainting. DIAGNOSIS  Your caregiver will perform a physical exam and take your medical history. In some cases, a heart specialist (cardiologist) may be consulted. Your caregiver may also order:  Blood tests.  Electrocardiography. This test records the electrical activity of your heart.  A heart monitoring test. TREATMENT  Treatment will depend on the likely cause of your tachycardia. The goal is to treat the underlying cause of your tachycardia. Treatment methods may include:  Replacement of fluids or blood through an intravenous (IV) tube for moderate to severe dehydration or anemia.  New medicines or changes in your current  medicines.  Diet and lifestyle changes.  Treatment for certain infections.  Stress relief or relaxation methods. HOME CARE INSTRUCTIONS   Rest.  Drink enough fluids to keep your urine clear or pale yellow.  Do not smoke.  Avoid:  Caffeine.  Tobacco.  Alcohol.  Chocolate.  Stimulants such as over-the-counter diet pills or pills that help you stay awake.  Situations that cause anxiety or stress.  Illegal drugs such as marijuana, phencyclidine (PCP), and cocaine.  Only take medicine as directed by your caregiver.  Keep all follow-up appointments as directed by your caregiver. SEEK IMMEDIATE MEDICAL CARE IF:   You have pain in your chest, upper arms, jaw, or neck.  You become weak, dizzy, or feel faint.  You have palpitations that will not go away.  You vomit, have diarrhea, or pass blood in your stool.  Your skin is cool, pale, and wet.  You have a fever that will not go away with rest, fluids, and medicine. MAKE SURE YOU:   Understand these instructions.  Will watch your condition.  Will get help right away if you are not doing well or get worse.   This information is not intended to replace advice given to you by your health care provider. Make sure you discuss any questions you have with your health care provider.   Document Released: 09/06/2004 Document Revised: 10/22/2011 Document Reviewed: 02/11/2015 Elsevier Interactive Patient Education [employer].

## 2016-01-20 NOTE — Progress Notes (Signed)
Pre visit review using our clinic review tool, if applicable. No additional management support is needed unless otherwise documented below in the visit note. 

## 2016-01-20 NOTE — Assessment & Plan Note (Signed)
February 2017 hgba1c 6.1. Follows with [facility] endocrinology, Dr Buddy Duty

## 2016-01-20 NOTE — Assessment & Plan Note (Signed)
Continues to follow with GI at [facility]. Continues to struggle

## 2016-01-20 NOTE — Assessment & Plan Note (Signed)
Anal itching and irritation. Try new probiotics, try Rowe with each BM and may use steroid cream after that.

## 2016-01-26 ENCOUNTER — Other Ambulatory Visit: Payer: BC Managed Care – PPO

## 2016-01-29 NOTE — Assessment & Plan Note (Signed)
Tolerating statin, encouraged heart healthy diet, avoid trans fats, minimize simple carbs and saturated fats. Increase exercise as tolerated 

## 2016-01-29 NOTE — Progress Notes (Signed)
Patient ID: Lori Zamora, female   DOB: [birth year], 60 y.o.   MRN: [id number]   Subjective:    Patient ID: Lori Zamora, female    DOB: 06/27/1956, 60 y.o.   MRN: [id number]  Chief Complaint  Patient presents with  . Follow-up    HPI Patient is in today for follow up. Is feeling well today. Notes her pulse has generally been between 80 and 120. No other recent illness or acute concern.  Denies CP/SOB/HA/congestion/fevers/GI or GU c/o. Taking meds as prescribed  Past Medical History  Diagnosis Date  . Unspecified sleep apnea   . Acquired acanthosis nigricans   . Unspecified vitamin D deficiency   . Bipolar disorder, unspecified ([place])   . Degeneration of intervertebral disc, site unspecified   . Other thalassemia ([place])   . Other specified iron deficiency anemias   . Esophageal reflux   . Systemic lupus erythematosus ([place])   . Unspecified constipation   . Migraine with aura, without mention of intractable migraine without mention of status migrainosus   . Anemia, unspecified   . Nontoxic multinodular goiter   . Myalgia and myositis, unspecified   . Other malaise and fatigue   . Unspecified essential hypertension   . Other and unspecified hyperlipidemia   . Type II or unspecified type diabetes mellitus without mention of complication, not stated as uncontrolled   . Depressive disorder, not elsewhere classified   . 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 Sjogren's syndrome ([place])   . CFS (chronic fatigue syndrome)   . Unspecified diffuse connective tissue disease 01/27/2013    Follows at [facility] rheumatology, Dr Alanda Amass Per patient has tested 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 antibody  . Renal insufficiency 01/27/2013  . Pelvic floor dysfunction 01/27/2013  . Microcytic anemia 01/31/2010    Qualifier: Diagnosis of  By: Fuller Plan CMA (AAMA), [place]    . Bipolar 2 disorder ([place]) 01/31/2010    Qualifier:  Diagnosis of  By: Fuller Plan CMA (AAMA), Terri Skains  Follows with Dr Letta Moynahan of psychiatry   . Intertriginous candidiasis 11/29/2013  . Small intestinal bacterial overgrowth 11/29/2013  . 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 Abdominal pain, unspecified site 11/29/2013  . Preventative health care 09/12/2014  . Gastroparesis 06/07/2015    Past Surgical History  Procedure Laterality Date  . Laparoscopic total hysterectomy  2004  . Biopsy thyroid      01/13/2015, 3 previous biopsy reported all benign    Family History  Problem Relation Age of Onset  . Diabetes Mother   . Heart disease Mother     family history  . Allergies Mother   . Obesity Sister   . Alcohol abuse      Family history of alcoholism and addiction  . Hypertension      family history  . Kidney disease      family history  . Stroke      1st degree relative <60  . Alcohol abuse Brother     drug abuse  . Heart disease Maternal Grandmother   . Heart disease Maternal Grandfather     Social History   Social History  . Marital Status: Single    Spouse Name: N/A  . Number of Children: 0  . Years of Education: N/A   Occupational History  . Teacher    Social History Main Topics  . Smoking status: Never Smoker   . Smokeless tobacco: Never  Used  . Alcohol Use: No  . Drug Use: No  . Sexual Activity: No     Comment: lives alone, no major dietary restrictions, retired from teaching kindergarten   Other Topics Concern  . 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 atorvastatin (LIPITOR) 20 MG tablet Take 1 tablet (20 mg total) by mouth 3 (three) times daily. 270 tablet 0  . cholecalciferol (VITAMIN D) 1000 UNITS tablet Take 2,000 Units by mouth daily.     . diphenhydrAMINE (BENADRYL) 25 MG tablet Take 25 mg by mouth as needed.    . empagliflozin (JARDIANCE) 10 MG  TABS tablet Take 10 mg by mouth daily.    Marland Kitchen glucose blood test strip 1 each by Other route daily. One Touch Ultra     . ibuprofen (ADVIL) 200 MG tablet Take 200 mg by mouth every 6 (six) hours as needed.      Marland Kitchen ketoconazole (NIZORAL) 2 % cream Apply 1 application topically daily. 60 g 6  . Lidocaine, Anorectal, 5 % CREA Apply topically as needed.    . Magnesium Oxide 500 MG (LAX) TABS Take by mouth daily. Take 1-4 tablets daily.    . metFORMIN (GLUCOPHAGE) 500 MG tablet Take 1,000 mg by mouth 2 (two) times daily with a meal.     . NON FORMULARY Lubricant eye ointment- sterile mineral oil 39.9% White Petroleum 57.7%    . NON FORMULARY Biotene dry mouth oral rinse    . Polyethyl Glycol-Propyl Glycol (SYSTANE) 0.4-0.3 % GEL Apply to eye.    Marland Kitchen PRESCRIPTION MEDICATION prevident 5000- dry mouth toothpaste    . Probiotic Product (DIGESTIVE ADVANTAGE GUMMIES PO) Take by mouth 2 (two) times daily.    Marland Kitchen RA SUNSCREEN SPF50 EX Apply topically as needed.      Marland Kitchen amLODipine (NORVASC) 5 MG tablet 1 tab po bid 180 tablet 3  . omeprazole (PRILOSEC) 20 MG capsule Take 1 capsule (20 mg total) by mouth daily. 90 capsule 0  . valsartan (DIOVAN) 80 MG tablet TAKE 1 TABLET(80 MG) BY MOUTH DAILY 90 tablet 0  . amLODipine (NORVASC) 5 MG tablet Take 5 mg by mouth daily.    Marland Kitchen atorvastatin (LIPITOR) 20 MG tablet Take 1 tablet (20 mg total) by mouth 3 (three) times daily. 270 tablet 2  . lansoprazole (PREVACID 24HR) 15 MG capsule TAKE 2 CAPSULES BY MOUTH TWICE DAILY BEFORE A MEAL (Patient taking differently: TAKE 2 CAPSULES BY MOUTH ONCE DAILY BEFORE A MEAL) 360 capsule 2   No facility-administered medications prior to visit.    Allergies  Allergen Reactions  . Acetazolamide   . Azathioprine   . Ezetimibe   . Latex   . Lisinopril   . Meloxicam   . Penicillins   . Pravastatin Sodium   . Simvastatin   . Sulfonamide Derivatives     REACTION: Swelling of tongue and face    Review of Systems  Constitutional:  Negative for fever and malaise/fatigue.  HENT: Negative for congestion.   Eyes: Negative for blurred vision.  Respiratory: Negative for shortness of breath.   Cardiovascular: Negative for chest pain, palpitations and leg swelling.  Gastrointestinal: Negative for nausea, abdominal pain and blood in stool.  Genitourinary: Negative for dysuria and frequency.  Musculoskeletal: Negative for falls.  Skin: Negative for rash.  Neurological: Negative for dizziness, loss of  consciousness and headaches.  Endo/Heme/Allergies: Negative for environmental allergies.  Psychiatric/Behavioral: Negative for depression. The patient is not nervous/anxious.        Objective:    Physical Exam  Constitutional: She is oriented to person, place, and time. She appears well-developed and well-nourished. No distress.  HENT:  Head: Normocephalic and atraumatic.  Nose: Nose normal.  Eyes: Right eye exhibits no discharge. Left eye exhibits no discharge.  Neck: Normal range of motion. Neck supple.  Cardiovascular: Normal rate and regular rhythm.   No murmur heard. Pulmonary/Chest: Effort normal and breath sounds normal.  Abdominal: Soft. Bowel sounds are normal. There is no tenderness.  Musculoskeletal: She exhibits no edema.  Neurological: She is alert and oriented to person, place, and time.  Skin: Skin is warm and dry.  Psychiatric: She has a normal mood and affect.  Nursing note and vitals reviewed.   BP 102/68 mmHg  Pulse 92  Temp(Src) 98.7 F (37.1 C) (Oral)  Ht '5\' 5"'  (1.651 m)  Wt 177 lb (80.287 kg)  BMI 29.45 kg/m2  SpO2 96% Wt Readings from Last 3 Encounters:  01/20/16 177 lb (80.287 kg)  09/20/15 176 lb 6 oz (80.003 kg)  08/03/15 180 lb (81.647 kg)     Lab Results  Component Value Date   WBC 8.3 01/20/2016   HGB 12.4 01/20/2016   HCT 39.1 01/20/2016   PLT 542.0* 01/20/2016   GLUCOSE 96 01/20/2016   CHOL 158 01/20/2016   TRIG 110.0 01/20/2016   HDL 39.00* 01/20/2016   LDLDIRECT 95  12/22/2009   LDLCALC 97 01/20/2016   ALT 14 01/20/2016   AST 13 01/20/2016   NA 139 01/20/2016   K 4.3 01/20/2016   CL 102 01/20/2016   CREATININE 0.92 01/20/2016   BUN 15 01/20/2016   CO2 28 01/20/2016   TSH 1.067 11/26/2013   HGBA1C 5.6 01/11/2014   MICROALBUR 4.05* 03/22/2014    Lab Results  Component Value Date   TSH 1.067 11/26/2013   Lab Results  Component Value Date   WBC 8.3 01/20/2016   HGB 12.4 01/20/2016   HCT 39.1 01/20/2016   MCV 75.9* 01/20/2016   PLT 542.0* 01/20/2016   Lab Results  Component Value Date   NA 139 01/20/2016   K 4.3 01/20/2016   CO2 28 01/20/2016   GLUCOSE 96 01/20/2016   BUN 15 01/20/2016   CREATININE 0.92 01/20/2016   BILITOT 0.3 01/20/2016   ALKPHOS 116 01/20/2016   AST 13 01/20/2016   ALT 14 01/20/2016   PROT 8.0 01/20/2016   ALBUMIN 4.5 01/20/2016   CALCIUM 10.2 01/20/2016   ANIONGAP 13.4 12/22/2009   GFR 80.36 01/20/2016   Lab Results  Component Value Date   CHOL 158 01/20/2016   Lab Results  Component Value Date   HDL 39.00* 01/20/2016   Lab Results  Component Value Date   LDLCALC 97 01/20/2016   Lab Results  Component Value Date   TRIG 110.0 01/20/2016   Lab Results  Component Value Date   CHOLHDL 4 01/20/2016   Lab Results  Component Value Date   HGBA1C 5.6 01/11/2014       Assessment & Plan:   Problem List Items Addressed This Visit    Itching    Anal itching and irritation. Try new probiotics, try [place] with each BM and may use steroid cream after that.       Relevant Orders   Lipid panel (Completed)   Comp Met (CMET) (Completed)  CBC (Completed)   Hyperlipidemia, mixed    Tolerating statin, encouraged heart healthy diet, avoid trans fats, minimize simple carbs and saturated fats. Increase exercise as tolerated      Relevant Medications   amLODipine (NORVASC) 5 MG tablet   valsartan (DIOVAN) 40 MG tablet   metoprolol succinate (TOPROL-XL) 25 MG 24 hr tablet   Other  Relevant Orders   Lipid panel (Completed)   Comp Met (CMET) (Completed)   CBC (Completed)   Gastroparesis    Continues to follow with GI at [facility]. Continues to struggle      Relevant Orders   Lipid panel (Completed)   Comp Met (CMET) (Completed)   CBC (Completed)   Essential hypertension - Primary    Well controlled, no changes to meds. Encouraged heart healthy diet such as the DASH diet and exercise as tolerated. Metoprolol XL 25 mg daily, Amlodipine 5 mg po bid.       Relevant Medications   amLODipine (NORVASC) 5 MG tablet   valsartan (DIOVAN) 40 MG tablet   metoprolol succinate (TOPROL-XL) 25 MG 24 hr tablet   Other Relevant Orders   Lipid panel (Completed)   Comp Met (CMET) (Completed)   CBC (Completed)   Diabetes mellitus type 2 in nonobese [facility])    February 2017 hgba1c 6.1. Follows with [facility] endocrinology, Dr Buddy Duty      Relevant Medications   valsartan (DIOVAN) 40 MG tablet   Other Relevant Orders   Lipid panel (Completed)   Comp Met (CMET) (Completed)   CBC (Completed)      I have discontinued Ms. Pannone's lansoprazole and valsartan. I am also having her start on valsartan and metoprolol succinate. Additionally, I am having her maintain her metFORMIN, cholecalciferol, glucose blood, RA SUNSCREEN SPF50 EX, ibuprofen, Magnesium Oxide, diphenhydrAMINE, Lidocaine (Anorectal), PRESCRIPTION MEDICATION, NON FORMULARY, NON FORMULARY, Polyethyl Glycol-Propyl Glycol, ketoconazole, Probiotic Product (DIGESTIVE ADVANTAGE GUMMIES PO), empagliflozin, atorvastatin, amLODipine, and omeprazole.  Meds ordered this encounter  Medications  . amLODipine (NORVASC) 5 MG tablet    Sig: 1 tab po bid    Dispense:  180 tablet    Refill:  3  . omeprazole (PRILOSEC) 20 MG capsule    Sig: Take 1 capsule (20 mg total) by mouth daily.    Dispense:  90 capsule    Refill:  2  . valsartan (DIOVAN) 40 MG tablet    Sig: Take 1 tablet (40 mg total) by mouth daily.    Dispense:  30 tablet     Refill:  3  . metoprolol succinate (TOPROL-XL) 25 MG 24 hr tablet    Sig: Take 1 tablet (25 mg total) by mouth daily.    Dispense:  30 tablet    Refill:  3     [name], MD

## 2016-01-31 ENCOUNTER — Other Ambulatory Visit (INDEPENDENT_AMBULATORY_CARE_PROVIDER_SITE_OTHER): Payer: BC Managed Care – PPO

## 2016-01-31 DIAGNOSIS — K3184 Gastroparesis: Secondary | ICD-10-CM

## 2016-01-31 DIAGNOSIS — E119 Type 2 diabetes mellitus without complications: Secondary | ICD-10-CM

## 2016-01-31 DIAGNOSIS — I10 Essential (primary) hypertension: Secondary | ICD-10-CM

## 2016-01-31 DIAGNOSIS — L299 Pruritus, unspecified: Secondary | ICD-10-CM

## 2016-01-31 DIAGNOSIS — E782 Mixed hyperlipidemia: Secondary | ICD-10-CM

## 2016-01-31 LAB — FECAL OCCULT BLOOD, IMMUNOCHEMICAL: Fecal Occult Bld: NEGATIVE

## 2016-01-31 NOTE — Addendum Note (Signed)
Addended by: Peggyann Shoals on: 01/31/2016 12:15 PM   Modules accepted: Orders

## 2016-02-21 ENCOUNTER — Ambulatory Visit (INDEPENDENT_AMBULATORY_CARE_PROVIDER_SITE_OTHER): Payer: BC Managed Care – PPO | Admitting: Family Medicine

## 2016-02-21 VITALS — BP 108/70 | HR 86

## 2016-02-21 DIAGNOSIS — I10 Essential (primary) hypertension: Secondary | ICD-10-CM

## 2016-02-21 MED ORDER — CARVEDILOL 3.125 MG PO TABS: 3.1250 mg | ORAL_TABLET | Freq: Two times a day (BID) | ORAL | Status: DC

## 2016-02-21 NOTE — Patient Instructions (Signed)
Per Dr. Charlett Blake: Stop taking metoprolol. Start taking carvedilol twice daily. Follow-up for blood pressure check in 2-4 weeks. Use a few drops of peroxide in your ear after you shower and call us if no improvement.

## 2016-02-21 NOTE — Progress Notes (Signed)
Pre visit review using our clinic review tool, if applicable. No additional management support is needed unless otherwise documented below in the visit note.  Per 01/20/16 AVS: 1 mn RN visit for bp.  Pt reports compliance w/ BP medications and bring home BP/P log in today for review (sent for scanning). She reports she has increased fatigue, lethargy, and daytime sleepiness and associated difficulty sleeping at night since starting metoprolol. She reports decrease in overall function as a result.   She also c/o of R ear pain for a few days. She has h/o of cerumen impaction.  Ears: normal TM's and external ear canals both ears. Moderate amount of cerumen in R ear, not occluding TM. No redness, swelling, or pain in either ear.  Per Dr. Charlett Blake: Stop taking metoprolol. Start taking carvedilol twice daily. Follow-up for blood pressure check in 2-4 weeks. Use a few drops of peroxide in your ear after you shower and call us if no improvement.  Pt verbalized understanding of instructions. Medication filled to pharmacy as requested.   Dorrene German, RN   RN note reviewed. Agree with documention and plan.

## 2016-02-21 NOTE — Progress Notes (Signed)
RN note reviewed. Agree with documention and plan. 

## 2016-02-24 ENCOUNTER — Telehealth: Payer: Self-pay | Admitting: Family Medicine

## 2016-02-24 NOTE — Telephone Encounter (Signed)
Carvedilol was the new start and was the problem  She states she will stop it and double on the valsartan. She would like to know if she needs appt. To discuss.

## 2016-02-24 NOTE — Telephone Encounter (Signed)
Will have Scheduler call to schedule patient.

## 2016-02-24 NOTE — Telephone Encounter (Signed)
Have her hold the metoprolol and try doubling up her valsartan to 80 mg daily and see how she does with that.

## 2016-02-24 NOTE — Telephone Encounter (Signed)
°  Relationship to patient: Self Can be reached:[phone]   Reason for call: Patient states that the Beta Blocker she was given on Tuesday is causing reactions. States she felt dizzy and lightheaded when she took it. States she had tingling all over. She took it yesterday morning but did not take it anymore and wants advise before she takes any more.

## 2016-02-24 NOTE — Telephone Encounter (Signed)
Sure get her an appt in 2-4 weeks to check and review

## 2016-03-05 ENCOUNTER — Other Ambulatory Visit: Payer: Self-pay | Admitting: Family Medicine

## 2016-03-07 LAB — HM PAP SMEAR

## 2016-03-13 ENCOUNTER — Ambulatory Visit (INDEPENDENT_AMBULATORY_CARE_PROVIDER_SITE_OTHER): Payer: BC Managed Care – PPO | Admitting: Family Medicine

## 2016-03-13 ENCOUNTER — Encounter: Payer: Self-pay | Admitting: Family Medicine

## 2016-03-13 VITALS — BP 102/60 | HR 95 | Temp 98.4°F | Ht 65.0 in | Wt 180.2 lb

## 2016-03-13 DIAGNOSIS — R000 Tachycardia, unspecified: Secondary | ICD-10-CM | POA: Diagnosis not present

## 2016-03-13 DIAGNOSIS — E1169 Type 2 diabetes mellitus with other specified complication: Secondary | ICD-10-CM

## 2016-03-13 DIAGNOSIS — I10 Essential (primary) hypertension: Secondary | ICD-10-CM | POA: Diagnosis not present

## 2016-03-13 DIAGNOSIS — E782 Mixed hyperlipidemia: Secondary | ICD-10-CM

## 2016-03-13 DIAGNOSIS — K5900 Constipation, unspecified: Secondary | ICD-10-CM

## 2016-03-13 DIAGNOSIS — E669 Obesity, unspecified: Secondary | ICD-10-CM

## 2016-03-13 DIAGNOSIS — E119 Type 2 diabetes mellitus without complications: Secondary | ICD-10-CM | POA: Diagnosis not present

## 2016-03-13 NOTE — Progress Notes (Signed)
Pre visit review using our clinic review tool, if applicable. No additional management support is needed unless otherwise documented below in the visit note. 

## 2016-03-13 NOTE — Patient Instructions (Signed)
Nonspecific Tachycardia Tachycardia is a faster than normal heartbeat (more than 100 beats per minute). In adults, the heart normally beats between 60 and 100 times a minute. A fast heartbeat may be a normal response to exercise or stress. It does not necessarily mean that something is wrong. However, sometimes when your heart beats too fast it may not be able to pump enough blood to the rest of your body. This can result in chest pain, shortness of breath, dizziness, and even fainting. Nonspecific tachycardia means that the specific cause or pattern of your tachycardia is unknown. CAUSES  Tachycardia may be harmless or it may be due to a more serious underlying cause. Possible causes of tachycardia include:  Exercise or exertion.  Fever.  Pain or injury.  Infection.  Loss of body fluids (dehydration).  Overactive thyroid.  Lack of red blood cells (anemia).  Anxiety and stress.  Alcohol.  Caffeine.  Tobacco products.  Diet pills.  Illegal drugs.  Heart disease. SYMPTOMS  Rapid or irregular heartbeat (palpitations).  Suddenly feeling your heart beating (cardiac awareness).  Dizziness.  Tiredness (fatigue).  Shortness of breath.  Chest pain.  Nausea.  Fainting. DIAGNOSIS  Your caregiver will perform a physical exam and take your medical history. In some cases, a heart specialist (cardiologist) may be consulted. Your caregiver may also order:  Blood tests.  Electrocardiography. This test records the electrical activity of your heart.  A heart monitoring test. TREATMENT  Treatment will depend on the likely cause of your tachycardia. The goal is to treat the underlying cause of your tachycardia. Treatment methods may include:  Replacement of fluids or blood through an intravenous (IV) tube for moderate to severe dehydration or anemia.  New medicines or changes in your current medicines.  Diet and lifestyle changes.  Treatment for certain  infections.  Stress relief or relaxation methods. HOME CARE INSTRUCTIONS   Rest.  Drink enough fluids to keep your urine clear or pale yellow.  Do not smoke.  Avoid:  Caffeine.  Tobacco.  Alcohol.  Chocolate.  Stimulants such as over-the-counter diet pills or pills that help you stay awake.  Situations that cause anxiety or stress.  Illegal drugs such as marijuana, phencyclidine (PCP), and cocaine.  Only take medicine as directed by your caregiver.  Keep all follow-up appointments as directed by your caregiver. SEEK IMMEDIATE MEDICAL CARE IF:   You have pain in your chest, upper arms, jaw, or neck.  You become weak, dizzy, or feel faint.  You have palpitations that will not go away.  You vomit, have diarrhea, or pass blood in your stool.  Your skin is cool, pale, and wet.  You have a fever that will not go away with rest, fluids, and medicine. MAKE SURE YOU:   Understand these instructions.  Will watch your condition.  Will get help right away if you are not doing well or get worse.   This information is not intended to replace advice given to you by your health care provider. Make sure you discuss any questions you have with your health care provider.   Document Released: 09/06/2004 Document Revised: 10/22/2011 Document Reviewed: 02/11/2015 Elsevier Interactive Patient Education 2016 [employer]  

## 2016-03-19 ENCOUNTER — Encounter: Payer: Self-pay | Admitting: Family Medicine

## 2016-03-19 ENCOUNTER — Other Ambulatory Visit: Payer: Self-pay | Admitting: Internal Medicine

## 2016-03-19 DIAGNOSIS — E049 Nontoxic goiter, unspecified: Secondary | ICD-10-CM

## 2016-03-25 NOTE — Assessment & Plan Note (Signed)
hgba1c acceptable, minimize simple carbs. Increase exercise as tolerated. Continue current meds. Declines pneumonia shot due to hi fever with last shot.

## 2016-03-25 NOTE — Assessment & Plan Note (Signed)
Encouraged heart healthy diet, increase exercise 

## 2016-03-25 NOTE — Assessment & Plan Note (Signed)
Encouraged increased hydration and fiber in diet. Daily probiotics. If bowels not moving can use MOM 2 tbls po in 4 oz of warm prune juice by mouth every 2-3 days. If no results then repeat in 4 hours with  Dulcolax suppository pr, may repeat again in 4 more hours as needed. Seek care if symptoms worsen. Consider daily Miralax and/or Dulcolax if symptoms persist. C/o some bloating at times. Encouraged to avoid offending foods.

## 2016-03-25 NOTE — Progress Notes (Signed)
Patient ID: Lori Zamora, female   DOB: September 30, 1955, 60 y.o.   MRN: [id number]   Subjective:    Patient ID: Lori Zamora, female    DOB: 1955/12/22, 60 y.o.   MRN: [id number]  Chief Complaint  Patient presents with  . Follow-up    blood pressure    HPI Patient is in today for follow up. She is struggling with not tolerating Beta blockers, Metoprolol and Carvedilol both caused nausea, fatigue, light headedness. She has not taken today and feels well. She is noting some abdominal bloating again and she notes her sugars trend up slightly when this happens. Denies CP/palp/SOB/HA/congestion/fevers or GU c/o. Taking meds as prescribed  Past Medical History:  Diagnosis Date  . Abdominal pain, unspecified site 11/29/2013  . Acquired acanthosis nigricans   . Anemia, unspecified   . 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 Bipolar 2 disorder ([place]) 01/31/2010   Qualifier: Diagnosis of  By: Fuller Plan CMA (AAMA), Terri Skains  Follows with Dr Letta Moynahan of psychiatry   . Bipolar disorder, unspecified ([place])   . CFS (chronic fatigue syndrome)   . Degeneration of intervertebral disc, site unspecified   . Depressive disorder, not elsewhere classified   . Esophageal reflux   . Gastroparesis 06/07/2015  . Intertriginous candidiasis 11/29/2013  . Microcytic anemia 01/31/2010   Qualifier: Diagnosis of  By: Fuller Plan CMA (AAMA), [place]    . Migraine with aura, without mention of intractable migraine without mention of status migrainosus   . Myalgia and myositis, unspecified   . Nontoxic multinodular goiter   . Other and unspecified hyperlipidemia   . Other malaise and fatigue   . Other specified iron deficiency anemias   . Other thalassemia ([place])   . Parotid gland enlargement 2011   related to connective tissue syndrome  . Pelvic floor dysfunction 01/27/2013  . Preventative health care 09/12/2014  . Renal insufficiency 01/27/2013  . Sjogren's syndrome ([place])   . Small intestinal bacterial  overgrowth 11/29/2013  . Systemic lupus erythematosus ([place])   . Type II or unspecified type diabetes mellitus without mention of complication, not stated as uncontrolled   . Unspecified constipation   . Unspecified diffuse connective tissue disease 01/27/2013   Follows at [facility] rheumatology, Dr Alanda Amass Per patient has tested 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 antibody  . Unspecified essential hypertension   . Unspecified sleep apnea   . Unspecified vitamin D deficiency     Past Surgical History:  Procedure Laterality Date  . BIOPSY THYROID     01/13/2015, 3 previous biopsy reported all benign  . LAPAROSCOPIC TOTAL HYSTERECTOMY  2004    Family History  Problem Relation Age of Onset  . Diabetes Mother   . Heart disease Mother     family history  . Allergies Mother   . Obesity Sister   . Alcohol abuse      Family history of alcoholism and addiction  . Hypertension      family history  . Kidney disease      family history  . Stroke      1st degree relative <60  . Alcohol abuse Brother     drug abuse  . Heart disease Maternal Grandmother   . Heart disease Maternal Grandfather     Social History   Social History  . Marital status: Single    Spouse name: N/A  . Number of children: 0  . Years of education: N/A   Occupational History  .  Teacher    Social History Main Topics  . Smoking status: Never Smoker  . Smokeless tobacco: Never Used  . Alcohol use No  . Drug use: No  . Sexual activity: No     Comment: lives alone, no major dietary restrictions, retired from teaching kindergarten   Other Topics Concern  . 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 amLODipine (NORVASC) 5 MG tablet 1 tab po bid 180 tablet 3  . atorvastatin (LIPITOR) 20 MG tablet TAKE 1 TABLET(20 MG) BY MOUTH THREE TIMES  DAILY 270 tablet 2  . cholecalciferol (VITAMIN D) 1000 UNITS tablet Take 2,000 Units by mouth daily.     . diphenhydrAMINE (BENADRYL) 25 MG tablet Take 25 mg by mouth as needed.    . empagliflozin (JARDIANCE) 10 MG TABS tablet Take 10 mg by mouth daily.    Marland Kitchen glucose blood test strip 1 each by Other route daily. One Touch Ultra     . ibuprofen (ADVIL) 200 MG tablet Take 200 mg by mouth every 6 (six) hours as needed.      Marland Kitchen ketoconazole (NIZORAL) 2 % cream Apply 1 application topically daily. 60 g 6  . Lidocaine, Anorectal, 5 % CREA Apply topically as needed.    . Magnesium Oxide 500 MG (LAX) TABS Take by mouth daily. Take 1-4 tablets daily.    . metFORMIN (GLUCOPHAGE) 500 MG tablet Take 1,000 mg by mouth 2 (two) times daily with a meal.     . NON FORMULARY Lubricant eye ointment- sterile mineral oil 39.9% White Petroleum 57.7%    . NON FORMULARY Biotene dry mouth oral rinse    . omeprazole (PRILOSEC) 20 MG capsule Take 1 capsule (20 mg total) by mouth daily. 90 capsule 2  . Polyethyl Glycol-Propyl Glycol (SYSTANE) 0.4-0.3 % GEL Apply to eye.    Marland Kitchen PRESCRIPTION MEDICATION prevident 5000- dry mouth toothpaste    . Probiotic Product (DIGESTIVE ADVANTAGE GUMMIES PO) Take by mouth 2 (two) times daily.    Marland Kitchen RA SUNSCREEN SPF50 EX Apply topically as needed.      . valsartan (DIOVAN) 80 MG tablet TAKE 1 TABLET(80 MG) BY MOUTH DAILY 90 tablet 2  . carvedilol (COREG) 3.125 MG tablet Take 1 tablet (3.125 mg total) by mouth 2 (two) times daily with a meal. 60 tablet 1  . valsartan (DIOVAN) 40 MG tablet Take 1 tablet (40 mg total) by mouth daily. 30 tablet 3   No facility-administered medications prior to visit.     Allergies  Allergen Reactions  . Penicillins Swelling    Swelling of tongue.  . Sulfonamide Derivatives Swelling    REACTION: Swelling of tongue and face  . Acetazolamide   . Azathioprine Other (See Comments)    Unknown  . Ezetimibe Other (See Comments)    Unknown  . Lisinopril Other  (See Comments)    Unknown  . Meloxicam Other (See Comments)    Unknown  . Pravastatin Sodium Other (See Comments)    Unknown  . Simvastatin Other (See Comments)    Unknown  . Latex Hives and Rash    Review of Systems  Constitutional: Positive for malaise/fatigue. Negative for fever.  HENT: Negative for congestion.   Eyes: Negative for blurred vision.  Respiratory: Negative for shortness of breath.   Cardiovascular: Negative for chest pain, palpitations and leg swelling.  Gastrointestinal: Positive for nausea. Negative for abdominal pain and blood in stool.  Genitourinary: Negative for dysuria and frequency.  Musculoskeletal: Positive for myalgias. Negative for falls.  Skin: Negative for rash.  Neurological: Positive for dizziness and tingling. Negative for loss of consciousness and headaches.  Endo/Heme/Allergies: Negative for environmental allergies.  Psychiatric/Behavioral: Negative for depression. The patient is not nervous/anxious.        Objective:    Physical Exam  Constitutional: She is oriented to person, place, and time. She appears well-developed and well-nourished. No distress.  HENT:  Head: Normocephalic and atraumatic.  Nose: Nose normal.  Eyes: Right eye exhibits no discharge. Left eye exhibits no discharge.  Neck: Normal range of motion. Neck supple.  Cardiovascular: Normal rate and regular rhythm.   No murmur heard. Pulmonary/Chest: Effort normal and breath sounds normal.  Abdominal: Soft. Bowel sounds are normal. There is no tenderness.  Musculoskeletal: She exhibits no edema.  Neurological: She is alert and oriented to person, place, and time.  Skin: Skin is warm and dry.  Psychiatric: She has a normal mood and affect.  Nursing note and vitals reviewed.   BP 102/60 (BP Location: Left Arm, Patient Position: Sitting, Cuff Size: Large)   Pulse 95   Temp 98.4 F (36.9 C) (Oral)   Ht 5\' 5"  (1.651 m)   Wt 180 lb 4 oz (81.8 kg)   SpO2 98%   BMI 30.00  kg/m  Wt Readings from Last 3 Encounters:  03/13/16 180 lb 4 oz (81.8 kg)  01/20/16 177 lb (80.3 kg)  09/20/15 176 lb 6 oz (80 kg)     Lab Results  Component Value Date   WBC 8.3 01/20/2016   HGB 12.4 01/20/2016   HCT 39.1 01/20/2016   PLT 542.0 (H) 01/20/2016   GLUCOSE 96 01/20/2016   CHOL 158 01/20/2016   TRIG 110.0 01/20/2016   HDL 39.00 (L) 01/20/2016   LDLDIRECT 95 12/22/2009   LDLCALC 97 01/20/2016   ALT 14 01/20/2016   AST 13 01/20/2016   NA 139 01/20/2016   K 4.3 01/20/2016   CL 102 01/20/2016   CREATININE 0.92 01/20/2016   BUN 15 01/20/2016   CO2 28 01/20/2016   TSH 1.067 11/26/2013   HGBA1C 6.1 10/11/2015   MICROALBUR 4.05 (H) 03/22/2014    Lab Results  Component Value Date   TSH 1.067 11/26/2013   Lab Results  Component Value Date   WBC 8.3 01/20/2016   HGB 12.4 01/20/2016   HCT 39.1 01/20/2016   MCV 75.9 (L) 01/20/2016   PLT 542.0 (H) 01/20/2016   Lab Results  Component Value Date   NA 139 01/20/2016   K 4.3 01/20/2016   CO2 28 01/20/2016   GLUCOSE 96 01/20/2016   BUN 15 01/20/2016   CREATININE 0.92 01/20/2016   BILITOT 0.3 01/20/2016   ALKPHOS 116 01/20/2016   AST 13 01/20/2016   ALT 14 01/20/2016   PROT 8.0 01/20/2016   ALBUMIN 4.5 01/20/2016   CALCIUM 10.2 01/20/2016   ANIONGAP 13.4 12/22/2009   GFR 80.36 01/20/2016   Lab Results  Component Value Date   CHOL 158 01/20/2016   Lab Results  Component Value Date   HDL 39.00 (L) 01/20/2016   Lab Results  Component Value Date   LDLCALC 97 01/20/2016   Lab Results  Component Value Date   TRIG 110.0 01/20/2016   Lab Results  Component Value Date   CHOLHDL 4 01/20/2016   Lab Results  Component Value Date  HGBA1C 6.1 10/11/2015       Assessment & Plan:   Problem List Items Addressed This Visit    Diabetes mellitus type 2 in nonobese ([place])    hgba1c acceptable, minimize simple carbs. Increase exercise as tolerated. Continue current meds. Declines pneumonia shot due to  hi fever with last shot.       Hyperlipidemia, mixed    Encouraged heart healthy diet, increase exercise      Essential hypertension    Well controlled, no changes to meds. Encouraged heart healthy diet such as the DASH diet and exercise as tolerated. Has not tolerated beta blockers so far. Metoprolol and Carvedilol both caused fatigue, dizziness, nausea. Will hold for now since BP is good today      Relevant Orders   Ambulatory referral to Cardiology   Constipation    Encouraged increased hydration and fiber in diet. Daily probiotics. If bowels not moving can use MOM 2 tbls po in 4 oz of warm prune juice by mouth every 2-3 days. If no results then repeat in 4 hours with  Dulcolax suppository pr, may repeat again in 4 more hours as needed. Seek care if symptoms worsen. Consider daily Miralax and/or Dulcolax if symptoms persist. C/o some bloating at times. Encouraged to avoid offending foods.        Other Visit Diagnoses    Tachycardia    -  Primary   Relevant Orders   Ambulatory referral to Cardiology   Diabetes mellitus type 2 in obese [facility])       Relevant Orders   Ambulatory referral to Cardiology      I have discontinued Ms. Elenes's carvedilol. I am also having her maintain her metFORMIN, cholecalciferol, glucose blood, RA SUNSCREEN SPF50 EX, ibuprofen, Magnesium Oxide, diphenhydrAMINE, Lidocaine (Anorectal), PRESCRIPTION MEDICATION, NON FORMULARY, NON FORMULARY, Polyethyl Glycol-Propyl Glycol, ketoconazole, Probiotic Product (DIGESTIVE ADVANTAGE GUMMIES PO), empagliflozin, amLODipine, omeprazole, atorvastatin, and valsartan.  No orders of the defined types were placed in this encounter.    [name], MD

## 2016-03-25 NOTE — Assessment & Plan Note (Addendum)
Well controlled, no changes to meds. Encouraged heart healthy diet such as the DASH diet and exercise as tolerated. Has not tolerated beta blockers so far. Metoprolol and Carvedilol both caused fatigue, dizziness, nausea. Will hold for now since BP is good today

## 2016-03-26 ENCOUNTER — Ambulatory Visit
Admission: RE | Admit: 2016-03-26 | Discharge: 2016-03-26 | Disposition: A | Discharge status: Home / Self Care | Payer: BC Managed Care – PPO | Source: Ambulatory Visit | Attending: Internal Medicine | Admitting: Internal Medicine

## 2016-03-26 DIAGNOSIS — E049 Nontoxic goiter, unspecified: Secondary | ICD-10-CM

## 2016-03-28 ENCOUNTER — Other Ambulatory Visit (HOSPITAL_BASED_OUTPATIENT_CLINIC_OR_DEPARTMENT_OTHER): Payer: Self-pay | Admitting: Obstetrics and Gynecology

## 2016-03-28 DIAGNOSIS — Z1231 Encounter for screening mammogram for malignant neoplasm of breast: Secondary | ICD-10-CM

## 2016-03-28 NOTE — Progress Notes (Signed)
Electrophysiology Office Note   Date:  03/29/2016   ID:  Lori Zamora, Lori Zamora June 12, 1956, MRN [id number]  PCP:  [name], MD   Primary Electrophysiologist:  [name], MD    Chief Complaint  Patient presents with  . Tachycardia     History of Present Illness: Lori Zamora is a 60 y.o. female who presents today for electrophysiology evaluation.   She's been having palpitations for the last year or so. She says that there is no rhinorrhea recent when the palpitations occur. They occur on most days, but she has been fairly sedentary over the past few days and has had less episodes of palpitations. That being said they do happen with both exertion and with rest. She says that there are no exacerbating or alleviating factors for her palpitations. Her primary physician has tried her on 2 separate beta blockers possibly metoprolol and carvedilol, which have given her side effects of weakness, fatigue, and dizziness. She does have a history of gastroparesis and in discussion with her primary gastroenterologist, he brought up the possibility that she could have some sort of autonomic dysfunction.   Today, she denies symptoms of , chest pain, orthopnea, PND, lower extremity edema, claudication, presyncope, syncope, bleeding, or neurologic sequela. The patient is tolerating medications without difficulties and is otherwise without complaint today.    Past Medical History:  Diagnosis Date  . Abdominal pain, unspecified site 11/29/2013  . Acquired acanthosis nigricans   . Anemia, unspecified   . 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 Bipolar 2 disorder ([place]) 01/31/2010   Qualifier: Diagnosis of  By: Fuller Plan CMA (AAMA), Terri Skains  Follows with Dr Letta Moynahan of psychiatry   . Bipolar disorder, unspecified ([place])   . CFS (chronic fatigue syndrome)   . Degeneration of intervertebral disc, site unspecified   . Depressive disorder, not elsewhere classified   .  Esophageal reflux   . Gastroparesis 06/07/2015  . Intertriginous candidiasis 11/29/2013  . Microcytic anemia 01/31/2010   Qualifier: Diagnosis of  By: Fuller Plan CMA (AAMA), Lug[place]    . Migraine with aura, without mention of intractable migraine without mention of status migrainosus   . Myalgia and myositis, unspecified   . Nontoxic multinodular goiter   . Other and unspecified hyperlipidemia   . Other malaise and fatigue   . Other specified iron deficiency anemias   . Other thalassemia ([place])   . Parotid gland enlargement 2011   related to connective tissue syndrome  . Pelvic floor dysfunction 01/27/2013  . Preventative health care 09/12/2014  . Renal insufficiency 01/27/2013  . Sjogren's syndrome ([place])   . Small intestinal bacterial overgrowth 11/29/2013  . Systemic lupus erythematosus ([place])   . Type II or unspecified type diabetes mellitus without mention of complication, not stated as uncontrolled   . Unspecified constipation   . Unspecified diffuse connective tissue disease 01/27/2013   Follows at [facility]ology, Dr Alanda Amass Per patient has tested 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 antibody  . Unspecified essential hypertension   . Unspecified sleep apnea   . Unspecified vitamin D deficiency    Past Surgical History:  Procedure Laterality Date  . BIOPSY THYROID     01/13/2015, 3 previous biopsy reported all benign  . LAPAROSCOPIC TOTAL HYSTERECTOMY  2004     Current Outpatient Prescriptions  Medication Sig Dispense Refill  . amLODipine (NORVASC) 5 MG tablet 1 tab po bid 180 tablet 3  . atorvastatin (LIPITOR) 20 MG tablet  TAKE 1 TABLET(20 MG) BY MOUTH THREE TIMES DAILY 270 tablet 2  . cholecalciferol (VITAMIN D) 1000 UNITS tablet Take 2,000 Units by mouth daily.     . diphenhydrAMINE (BENADRYL) 25 MG tablet Take 25 mg by mouth as needed.    . empagliflozin (JARDIANCE) 10 MG TABS tablet Take 10 mg by mouth daily.    Marland Kitchen glucose blood test  strip 1 each by Other route daily. One Touch Ultra     . ibuprofen (ADVIL) 200 MG tablet Take 200 mg by mouth every 6 (six) hours as needed.      Marland Kitchen ketoconazole (NIZORAL) 2 % cream Apply 1 application topically daily. 60 g 6  . Lidocaine, Anorectal, 5 % CREA Apply topically as needed.    . Magnesium Oxide 500 MG (LAX) TABS Take by mouth daily. Take 1-4 tablets daily.    . metFORMIN (GLUCOPHAGE) 500 MG tablet Take 1,000 mg by mouth 2 (two) times daily with a meal.     . NON FORMULARY Lubricant eye ointment- sterile mineral oil 39.9% White Petroleum 57.7%    . NON FORMULARY Biotene dry mouth oral rinse    . omeprazole (PRILOSEC) 20 MG capsule Take 1 capsule (20 mg total) by mouth daily. 90 capsule 2  . Polyethyl Glycol-Propyl Glycol (SYSTANE) 0.4-0.3 % GEL Apply to eye.    Marland Kitchen PRESCRIPTION MEDICATION prevident 5000- dry mouth toothpaste    . Probiotic Product (DIGESTIVE ADVANTAGE GUMMIES PO) Take by mouth 2 (two) times daily.    Marland Kitchen RA SUNSCREEN SPF50 EX Apply topically as needed.      . valsartan (DIOVAN) 80 MG tablet TAKE 1 TABLET(80 MG) BY MOUTH DAILY 90 tablet 2   No current facility-administered medications for this visit.     Allergies:   Penicillins; Sulfonamide derivatives; Acetazolamide; Azathioprine; Ezetimibe; Lisinopril; Meloxicam; Pravastatin sodium; Simvastatin; and Latex   Social History:  The patient  reports that she has never smoked. She has never used smokeless tobacco. She reports that she does not drink alcohol or use drugs.   Family History:  The patient's family history includes Alcohol abuse in her brother; Allergies in her mother; Diabetes in her mother; Heart disease in her maternal grandfather, maternal grandmother, and mother; Obesity in her sister.    ROS:  Please see the history of present illness.   Otherwise, review of systems is positive for palpitations, weight change, DOE, abdominal pain, constipation, muscle pain, dizziness, easy bruising.   All other systems  are reviewed and negative.    PHYSICAL EXAM: VS:  BP 118/62   Pulse 73   Ht 5' 5.5" (1.664 m)   Wt 174 lb 12.8 oz (79.3 kg)   SpO2 97%   BMI 28.65 kg/m  , BMI Body mass index is 28.65 kg/m. GEN: Well nourished, well developed, in no acute distress  HEENT: normal  Neck: no JVD, carotid bruits, or masses Cardiac: RRR; no murmurs, rubs, or gallops,no edema  Respiratory:  clear to auscultation bilaterally, normal work of breathing GI: soft, nontender, nondistended, + BS MS: no deformity or atrophy  Skin: warm and dry Neuro:  Strength and sensation are intact Psych: euthymic mood, full affect  EKG:  EKG is ordered today. Personal review of the ekg ordered shows sinus rhythm, rate 73  Recent Labs: 06/07/2015: Magnesium 2.2 01/20/2016: ALT 14; BUN 15; Creatinine, Ser 0.92; Hemoglobin 12.4; Platelets 542.0; Potassium 4.3; Sodium 139    Lipid Panel     Component Value Date/Time   CHOL 158 01/20/2016  0846   TRIG 110.0 01/20/2016 0846   HDL 39.00 (L) 01/20/2016 0846   CHOLHDL 4 01/20/2016 0846   VLDL 22.0 01/20/2016 0846   LDLCALC 97 01/20/2016 0846   LDLDIRECT 95 12/22/2009     Wt Readings from Last 3 Encounters:  03/29/16 174 lb 12.8 oz (79.3 kg)  03/13/16 180 lb 4 oz (81.8 kg)  01/20/16 177 lb (80.3 kg)      Other studies Reviewed: Additional studies/ records that were reviewed today include: TTE 10/05/15  Review of the above records today demonstrates:  - Left ventricle: The cavity size was normal. Wall thickness was   normal. Systolic function was normal. The estimated ejection   fraction was in the range of 55% to 60%. Wall motion was normal;   there were no regional wall motion abnormalities. Doppler   parameters are consistent with abnormal left ventricular   relaxation (grade 1 diastolic dysfunction). - Aortic valve: Trileaflet; mildly thickened, mildly calcified   leaflets. - Aortic root: The aortic root was normal in size. - Mitral valve: There was mild  regurgitation. - Atrial septum: The interatrial septum was hypermobile.    ASSESSMENT AND PLAN:  1.  Hypertension: Currently well controlled  2. Hyperlipidemia: On atorvastatin  3. Tachycardia: At this time it is difficult to say what the cause of tachycardias. She does bring in blood pressure and pulse readings with heart rates that averaged between the 80s to the low 100s. She has tried deep breathing maneuvers that have not helped with her tachycardia. Due to the unknown diagnosis, we'll order a 30 day monitor to further evaluate causes of her tachycardia. We'll see her back in 6 weeks to discuss the results.    Current medicines are reviewed at length with the patient today.   The patient does not have concerns regarding her medicines.  The following changes were made today:  none  Labs/ tests ordered today include:  No orders of the defined types were placed in this encounter.    Disposition:   FU with Owenn Rothermel 6 weeks  Signed, Johanna Stafford Meredith Leeds, MD  03/29/2016 8:37 AM     [facility] [address] [place] [address] [phone] (office) [phone] (fax)

## 2016-03-29 ENCOUNTER — Encounter: Payer: Self-pay | Admitting: Cardiology

## 2016-03-29 ENCOUNTER — Ambulatory Visit (INDEPENDENT_AMBULATORY_CARE_PROVIDER_SITE_OTHER): Payer: BC Managed Care – PPO | Admitting: Cardiology

## 2016-03-29 VITALS — BP 118/62 | HR 73 | Ht 65.5 in | Wt 174.8 lb

## 2016-03-29 DIAGNOSIS — R000 Tachycardia, unspecified: Secondary | ICD-10-CM | POA: Diagnosis not present

## 2016-03-29 NOTE — Patient Instructions (Signed)
Medication Instructions:  Your physician recommends that you continue on your current medications as directed. Please refer to the Current Medication list given to you today.   Labwork: None ordered   Testing/Procedures: Your physician has recommended that you wear an event monitor. Event monitors are medical devices that record the heart's electrical activity. Doctors most often [place] these monitors to diagnose arrhythmias. Arrhythmias are problems with the speed or rhythm of the heartbeat. The monitor is a small, portable device. You can wear one while you do your normal daily activities. This is usually used to diagnose what is causing palpitations/syncope (passing out).     Follow-Up: Your physician recommends that you schedule a follow-up appointment in: 6 weeks in [facility] with Dr Curt Bears   Any Other Special Instructions Will Be Listed Below (If Applicable).     If you need a refill on your cardiac medications before your next appointment, please call your pharmacy.

## 2016-03-30 ENCOUNTER — Ambulatory Visit (INDEPENDENT_AMBULATORY_CARE_PROVIDER_SITE_OTHER): Payer: BC Managed Care – PPO

## 2016-03-30 DIAGNOSIS — R000 Tachycardia, unspecified: Secondary | ICD-10-CM | POA: Diagnosis not present

## 2016-04-18 LAB — HEMOGLOBIN A1C
HEMOGLOBIN A1C: 5.9
HEMOGLOBIN A1C: 6.1

## 2016-04-24 ENCOUNTER — Ambulatory Visit (HOSPITAL_BASED_OUTPATIENT_CLINIC_OR_DEPARTMENT_OTHER): Payer: BC Managed Care – PPO

## 2016-04-26 ENCOUNTER — Encounter: Payer: Self-pay | Admitting: Family Medicine

## 2016-05-01 ENCOUNTER — Ambulatory Visit (HOSPITAL_BASED_OUTPATIENT_CLINIC_OR_DEPARTMENT_OTHER)
Admission: RE | Admit: 2016-05-01 | Discharge: 2016-05-01 | Disposition: A | Discharge status: Home / Self Care | Payer: BC Managed Care – PPO | Source: Ambulatory Visit | Attending: Obstetrics and Gynecology | Admitting: Obstetrics and Gynecology

## 2016-05-01 DIAGNOSIS — Z1231 Encounter for screening mammogram for malignant neoplasm of breast: Secondary | ICD-10-CM | POA: Diagnosis not present

## 2016-05-17 ENCOUNTER — Other Ambulatory Visit: Payer: Self-pay | Admitting: Family Medicine

## 2016-05-17 ENCOUNTER — Encounter: Payer: Self-pay | Admitting: Family Medicine

## 2016-05-17 DIAGNOSIS — L989 Disorder of the skin and subcutaneous tissue, unspecified: Secondary | ICD-10-CM

## 2016-06-05 NOTE — Progress Notes (Signed)
Electrophysiology Office Note   Date:  06/06/2016   ID:  [name], Lori Zamora 1955-11-18, MRN [id number]  PCP:  Lori Homans, MD   Primary Electrophysiologist:  Lori Haw, MD    Chief Complaint  Patient presents with  . Palpitations     History of Present Illness: Lori Zamora is a 60 y.o. female who presents today for electrophysiology evaluation.   She has been feeling much better since her last evaluation. She did wear a 30 day monitor that showed no acute evidence of arrhythmia, but she did have sinus tachycardia. She says that she has occasionally felt palpitations. She brings in a record of her heart rates while at home over the last few days which have ranged from the high 80s to low 100s. She is worried about possible saying that she feels like her heart rate goes up significantly when she stands.   Today, she denies symptoms of , chest pain, orthopnea, PND, lower extremity edema, claudication, presyncope, syncope, bleeding, or neurologic sequela. The patient is tolerating medications without difficulties and is otherwise without complaint today.    Past Medical History:  Diagnosis Date  . Abdominal pain, unspecified site 11/29/2013  . Acquired acanthosis nigricans   . Anemia, unspecified   . 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 Bipolar 2 disorder ([place]) 01/31/2010   Qualifier: Diagnosis of  By: Fuller Plan CMA (AAMA), Lori Zamora  Follows with Dr Letta Moynahan of psychiatry   . Bipolar disorder, unspecified   . CFS (chronic fatigue syndrome)   . Degeneration of intervertebral disc, site unspecified   . Depressive disorder, not elsewhere classified   . Esophageal reflux   . Gastroparesis 06/07/2015  . Intertriginous candidiasis 11/29/2013  . Microcytic anemia 01/31/2010   Qualifier: Diagnosis of  By: Fuller Plan CMA (AAMA), Lori Zamora    . Migraine with aura, without mention of intractable migraine without mention of status migrainosus   . Myalgia and  myositis, unspecified   . Nontoxic multinodular goiter   . Other and unspecified hyperlipidemia   . Other malaise and fatigue   . Other specified iron deficiency anemias   . Other thalassemia ([place])   . Parotid gland enlargement 2011   related to connective tissue syndrome  . Pelvic floor dysfunction 01/27/2013  . Preventative health care 09/12/2014  . Renal insufficiency 01/27/2013  . Sjogren's syndrome ([place])   . Small intestinal bacterial overgrowth 11/29/2013  . Systemic lupus erythematosus (Lori Zamora)   . Type II or unspecified type diabetes mellitus without mention of complication, not stated as uncontrolled   . Unspecified constipation   . Unspecified diffuse connective tissue disease 01/27/2013   Follows at [facility] rheumatology, Dr Lori Zamora Per patient has tested 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 antibody  . Unspecified essential hypertension   . Unspecified sleep apnea   . Unspecified vitamin D deficiency    Past Surgical History:  Procedure Laterality Date  . BIOPSY THYROID     01/13/2015, 3 previous biopsy reported all benign  . LAPAROSCOPIC TOTAL HYSTERECTOMY  2004     Current Outpatient Prescriptions  Medication Sig Dispense Refill  . alclomethasone (ACLOVATE) 0.05 % cream Apply 1 application topically 2 (two) times daily.    Marland Kitchen amLODipine (NORVASC) 5 MG tablet 1 tab po bid 180 tablet 3  . atorvastatin (LIPITOR) 20 MG tablet TAKE 1 TABLET(20 MG) BY MOUTH THREE TIMES DAILY 270 tablet 2  . betamethasone dipropionate 0.05 % lotion Apply 1 application topically 2 (  two) times daily.    . cholecalciferol (VITAMIN D) 1000 UNITS tablet Take 2,000 Units by mouth daily.     . diphenhydrAMINE (BENADRYL) 25 MG tablet Take 25 mg by mouth as needed.    . empagliflozin (JARDIANCE) 10 MG TABS tablet Take 10 mg by mouth daily.    Marland Kitchen glucose blood test strip 1 each by Other route daily. One Touch Ultra     . ibuprofen (ADVIL) 200 MG tablet Take 200 mg by  mouth every 6 (six) hours as needed.      Marland Kitchen ketoconazole (NIZORAL) 2 % cream Apply 1 application topically daily. 60 g 6  . Lidocaine, Anorectal, 5 % CREA Apply topically as needed.    . Magnesium Oxide 500 MG (LAX) TABS Take by mouth daily. Take 1-4 tablets daily.    . metFORMIN (GLUCOPHAGE) 500 MG tablet Take 1,000 mg by mouth 2 (two) times daily with a meal.     . NON FORMULARY Lubricant eye ointment- sterile mineral oil 39.9% White Petroleum 57.7%    . NON FORMULARY Biotene dry mouth oral rinse    . omeprazole (PRILOSEC) 20 MG capsule Take 1 capsule (20 mg total) by mouth daily. 90 capsule 2  . Polyethyl Glycol-Propyl Glycol (SYSTANE) 0.4-0.3 % GEL Apply to eye.    Marland Kitchen PRESCRIPTION MEDICATION prevident 5000- dry mouth toothpaste    . Probiotic Product (DIGESTIVE ADVANTAGE GUMMIES PO) Take by mouth 2 (two) times daily.    Marland Kitchen RA SUNSCREEN SPF50 EX Apply topically as needed.      . valsartan (DIOVAN) 80 MG tablet TAKE 1 TABLET(80 MG) BY MOUTH DAILY 90 tablet 2   No current facility-administered medications for this visit.     Allergies:   Penicillins; Sulfonamide derivatives; Acetazolamide; Azathioprine; Ezetimibe; Lisinopril; Meloxicam; Pravastatin sodium; Simvastatin; and Latex   Social History:  The patient  reports that she has never smoked. She has never used smokeless tobacco. She reports that she does not drink alcohol or use drugs.   Family History:  The patient's family history includes Alcohol abuse in her brother; Allergies in her mother; Diabetes in her mother; Heart disease in her maternal grandfather, maternal grandmother, and mother; Obesity in her sister.    ROS:  Please see the history of present illness.   Otherwise, review of systems is positive for hearing loss, rash.   All other systems are reviewed and negative.    PHYSICAL EXAM: VS:  BP 114/68   Pulse 80   Ht 5\' 5"  (1.651 m)   Wt 173 lb 6.4 oz (78.7 kg)   SpO2 99%   BMI 28.86 kg/m  , BMI Body mass index is 28.86  kg/m. GEN: Well nourished, well developed, in no acute distress  HEENT: normal  Neck: no JVD, carotid bruits, or masses Cardiac: RRR; no murmurs, rubs, or gallops,no edema  Respiratory:  clear to auscultation bilaterally, normal work of breathing GI: soft, nontender, nondistended, + BS MS: no deformity or atrophy  Skin: warm and dry Neuro:  Strength and sensation are intact Psych: euthymic mood, full affect  EKG:  EKG is not ordered today. Personal review of the ekg ordered 8/17 shows sinus rhythm, rate 73  Orthostatic VS for the past 24 hrs:  BP- Lying Pulse- Lying BP- Sitting Pulse- Sitting BP- Standing at 0 minutes Pulse- Standing at 0 minutes  06/06/16 0857 112/72 75 116/74 81 124/78 88       Recent Labs: 06/07/2015: Magnesium 2.2 01/20/2016: ALT 14; BUN 15; Creatinine,  Ser 0.92; Hemoglobin 12.4; Platelets 542.0; Potassium 4.3; Sodium 139    Lipid Panel     Component Value Date/Time   CHOL 158 01/20/2016 0846   TRIG 110.0 01/20/2016 0846   HDL 39.00 (L) 01/20/2016 0846   CHOLHDL 4 01/20/2016 0846   VLDL 22.0 01/20/2016 0846   LDLCALC 97 01/20/2016 0846   LDLDIRECT 95 12/22/2009     Wt Readings from Last 3 Encounters:  06/06/16 173 lb 6.4 oz (78.7 kg)  03/29/16 174 lb 12.8 oz (79.3 kg)  03/13/16 180 lb 4 oz (81.8 kg)      Other studies Reviewed: Additional studies/ records that were reviewed today include: TTE 10/05/15  Review of the above records today demonstrates:  - Left ventricle: The cavity size was normal. Wall thickness was   normal. Systolic function was normal. The estimated ejection   fraction was in the range of 55% to 60%. Wall motion was normal;   there were no regional wall motion abnormalities. Doppler   parameters are consistent with abnormal left ventricular   relaxation (grade 1 diastolic dysfunction). - Aortic valve: Trileaflet; mildly thickened, mildly calcified   leaflets. - Aortic root: The aortic root was normal in size. - Mitral  valve: There was mild regurgitation. - Atrial septum: The interatrial septum was hypermobile.  Personal review of Telemetry 03/30/16 The initial Baseline transmission shows SINUS RHYTHM The high heart rate seen for the monitored period was 150 BPM. The low heart rate seen for the monitored period was 90 BPM. 19 manually-triggered recording(s) posted with symptoms including Rapid HR/Palp/Flutter associated with sinus rhythm and sinus tachycardia  ASSESSMENT AND PLAN:  1.  Hypertension: Currently well controlled  2. Hyperlipidemia: On atorvastatin  3. Tachycardia: No major abnormalities seen on her event monitor. Event consistent with sinus tachycardia. I have offered her medical management for this, but she has declined. She is currently not orthostatic today in clinic and therefore it is unlikely that she has pots, as she did not have a tachycardic response.     Current medicines are reviewed at length with the patient today.   The patient does not have concerns regarding her medicines.  The following changes were made today:  none  Labs/ tests ordered today include:  No orders of the defined types were placed in this encounter.    Disposition:   FU with Jomayra Novitsky as needed  Signed, Laray Corbit Meredith Leeds, MD  06/06/2016 8:37 AM     [facility] [address] [place] [address] [phone] (office) [phone] (fax)

## 2016-06-06 ENCOUNTER — Ambulatory Visit: Payer: BC Managed Care – PPO | Admitting: Cardiology

## 2016-06-06 ENCOUNTER — Encounter: Payer: Self-pay | Admitting: Cardiology

## 2016-06-06 ENCOUNTER — Ambulatory Visit (INDEPENDENT_AMBULATORY_CARE_PROVIDER_SITE_OTHER): Payer: BC Managed Care – PPO | Admitting: Cardiology

## 2016-06-06 VITALS — BP 114/68 | HR 80 | Ht 65.0 in | Wt 173.4 lb

## 2016-06-06 DIAGNOSIS — R000 Tachycardia, unspecified: Secondary | ICD-10-CM

## 2016-06-06 DIAGNOSIS — R002 Palpitations: Secondary | ICD-10-CM

## 2016-06-06 NOTE — Patient Instructions (Signed)
Medication Instructions:  Your physician recommends that you continue on your current medications as directed. Please refer to the Current Medication list given to you today.  * If you need a refill on your cardiac medications before your next appointment, please call your pharmacy.   Labwork: None ordered  Testing/Procedures: None ordered  Follow-Up: No follow up is needed at this time with Dr. Camnitz.  He will see you on an as needed basis.   Thank you for choosing [facility]!!   Wyatt Galvan, RN [phone]     

## 2016-06-07 ENCOUNTER — Encounter: Payer: Self-pay | Admitting: Family Medicine

## 2016-06-07 ENCOUNTER — Ambulatory Visit (INDEPENDENT_AMBULATORY_CARE_PROVIDER_SITE_OTHER): Payer: BC Managed Care – PPO | Admitting: Family Medicine

## 2016-06-07 DIAGNOSIS — K5900 Constipation, unspecified: Secondary | ICD-10-CM

## 2016-06-07 DIAGNOSIS — Z23 Encounter for immunization: Secondary | ICD-10-CM | POA: Diagnosis not present

## 2016-06-07 DIAGNOSIS — D473 Essential (hemorrhagic) thrombocythemia: Secondary | ICD-10-CM

## 2016-06-07 DIAGNOSIS — I10 Essential (primary) hypertension: Secondary | ICD-10-CM

## 2016-06-07 DIAGNOSIS — E559 Vitamin D deficiency, unspecified: Secondary | ICD-10-CM | POA: Diagnosis not present

## 2016-06-07 DIAGNOSIS — E782 Mixed hyperlipidemia: Secondary | ICD-10-CM

## 2016-06-07 DIAGNOSIS — E119 Type 2 diabetes mellitus without complications: Secondary | ICD-10-CM

## 2016-06-07 DIAGNOSIS — D75839 Thrombocytosis, unspecified: Secondary | ICD-10-CM

## 2016-06-07 HISTORY — DX: Thrombocytosis, unspecified: D75.839

## 2016-06-07 LAB — COMPREHENSIVE METABOLIC PANEL
ALK PHOS: 112 U/L (ref 39–117)
ALT: 15 U/L (ref 0–35)
AST: 13 U/L (ref 0–37)
Albumin: 4.6 g/dL (ref 3.5–5.2)
BILIRUBIN TOTAL: 0.4 mg/dL (ref 0.2–1.2)
BUN: 13 mg/dL (ref 6–23)
CALCIUM: 10.3 mg/dL (ref 8.4–10.5)
CO2: 27 meq/L (ref 19–32)
CREATININE: 0.88 mg/dL (ref 0.40–1.20)
Chloride: 103 mEq/L (ref 96–112)
GFR: 84.48 mL/min (ref >60.00)
Glucose, Bld: 99 mg/dL (ref 70–99)
Potassium: 4 mEq/L (ref 3.5–5.1)
Sodium: 141 mEq/L (ref 135–145)
TOTAL PROTEIN: 8.2 g/dL (ref 6.0–8.3)

## 2016-06-07 LAB — LIPID PANEL
CHOL/HDL RATIO: 4
Cholesterol: 177 mg/dL (ref 0–200)
HDL: 41.4 mg/dL (ref >39.00)
LDL Cholesterol: 106 mg/dL — ABNORMAL HIGH (ref 0–99)
NONHDL: 135.94
Triglycerides: 149 mg/dL (ref 0.0–149.0)
VLDL: 29.8 mg/dL (ref 0.0–40.0)

## 2016-06-07 LAB — CBC
HCT: 40.2 % (ref 36.0–46.0)
Hemoglobin: 12.7 g/dL (ref 12.0–15.0)
MCHC: 31.6 g/dL (ref 30.0–36.0)
MCV: 77.1 fl — ABNORMAL LOW (ref 78.0–100.0)
PLATELETS: 499 10*3/uL — AB (ref 150.0–400.0)
RBC: 5.21 Mil/uL — ABNORMAL HIGH (ref 3.87–5.11)
RDW: 18.2 % — AB (ref 11.5–15.5)
WBC: 7.2 10*3/uL (ref 4.0–10.5)

## 2016-06-07 LAB — TSH: TSH: 1.34 u[IU]/mL (ref 0.35–4.50)

## 2016-06-07 LAB — VITAMIN D 25 HYDROXY (VIT D DEFICIENCY, FRACTURES): VITD: 44.83 ng/mL (ref 30.00–100.00)

## 2016-06-07 NOTE — Assessment & Plan Note (Signed)
Well controlled, no changes to meds. Encouraged heart healthy diet such as the DASH diet and exercise as tolerated.  °

## 2016-06-07 NOTE — Assessment & Plan Note (Signed)
Mild, asymptomatic,

## 2016-06-07 NOTE — Patient Instructions (Signed)

## 2016-06-07 NOTE — Assessment & Plan Note (Addendum)
Checked today, now WNL

## 2016-06-07 NOTE — Assessment & Plan Note (Signed)
Encouraged heart healthy diet, increase exercise, avoid trans fats, consider a krill oil cap daily 

## 2016-06-07 NOTE — Progress Notes (Signed)
Pre visit review using our clinic review tool, if applicable. No additional management support is needed unless otherwise documented below in the visit note. 

## 2016-06-10 NOTE — Assessment & Plan Note (Signed)
hgba1c acceptable, minimize simple carbs. Increase exercise as tolerated. Continue current meds 

## 2016-06-10 NOTE — Assessment & Plan Note (Addendum)
Encouraged increased hydration and fiber in diet. Daily probiotics.  

## 2016-06-10 NOTE — Progress Notes (Signed)
Patient ID: Lori Zamora, female   DOB: March 05, 1956, 60 y.o.   MRN: [id number]   Subjective:    Patient ID: Lori Zamora, female    DOB: 1956/04/23, 60 y.o.   MRN: [id number]  Chief Complaint  Patient presents with  . Follow-up    HPI Patient is in today for follow up. She denies any acute illness or complaint but has been seen by numerous specialists since her last visit. She notes she saw dermatology and they were able to give her some stroid cream which helped. No recent hospitalization or acute illness. She has bee seen by her endocrinologist and her most recent hgba1c was 5.9. No polyuria or polydipsia. Denies CP/palp/SOB/HA/congestion/fevers or GU c/o. Taking meds as prescribed  Past Medical History:  Diagnosis Date  . Abdominal pain, unspecified site 11/29/2013  . Acquired acanthosis nigricans   . Anemia, unspecified   . 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 Bipolar 2 disorder ([place]) 01/31/2010   Qualifier: Diagnosis of  By: Fuller Plan CMA (AAMA), Terri Skains  Follows with Dr Letta Moynahan of psychiatry   . Bipolar disorder, unspecified   . CFS (chronic fatigue syndrome)   . Degeneration of intervertebral disc, site unspecified   . Depressive disorder, not elsewhere classified   . Esophageal reflux   . Gastroparesis 06/07/2015  . Intertriginous candidiasis 11/29/2013  . Microcytic anemia 01/31/2010   Qualifier: Diagnosis of  By: Fuller Plan CMA (AAMA), [place]    . Migraine with aura, without mention of intractable migraine without mention of status migrainosus   . Myalgia and myositis, unspecified   . Nontoxic multinodular goiter   . Other and unspecified hyperlipidemia   . Other malaise and fatigue   . Other specified iron deficiency anemias   . Other thalassemia ([place])   . Parotid gland enlargement 2011   related to connective tissue syndrome  . Pelvic floor dysfunction 01/27/2013  . Preventative health care 09/12/2014  . Renal insufficiency 01/27/2013  . Sjogren's  syndrome ([place])   . Small intestinal bacterial overgrowth 11/29/2013  . Systemic lupus erythematosus ([place])   . Thrombocytosis ([place]) 06/07/2016  . Type II or unspecified type diabetes mellitus without mention of complication, not stated as uncontrolled   . Unspecified constipation   . Unspecified diffuse connective tissue disease 01/27/2013   Follows at [facility]ology, Dr Alanda Amass Per patient has tested 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 antibody  . Unspecified essential hypertension   . Unspecified sleep apnea   . Unspecified vitamin D deficiency     Past Surgical History:  Procedure Laterality Date  . BIOPSY THYROID     01/13/2015, 3 previous biopsy reported all benign  . LAPAROSCOPIC TOTAL HYSTERECTOMY  2004    Family History  Problem Relation Age of Onset  . Diabetes Mother   . Heart disease Mother     family history  . Allergies Mother   . Alcohol abuse      Family history of alcoholism and addiction  . Alcohol abuse Brother     drug abuse  . Heart disease Maternal Grandmother   . Heart disease Maternal Grandfather   . Obesity Sister   . Hypertension      family history  . Kidney disease      family history  . Stroke      1st degree relative <60    Social History   Social History  . Marital status: Single    Spouse name: N/A  .  Number of children: 0  . Years of education: N/A   Occupational History  . Teacher    Social History Main Topics  . Smoking status: Never Smoker  . Smokeless tobacco: Never Used  . Alcohol use No  . Drug use: No  . Sexual activity: No     Comment: lives alone, no major dietary restrictions, retired from teaching kindergarten   Other Topics Concern  . 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 alclomethasone (ACLOVATE) 0.05 % cream Apply 1  application topically 2 (two) times daily.    Marland Kitchen amLODipine (NORVASC) 5 MG tablet 1 tab po bid 180 tablet 3  . atorvastatin (LIPITOR) 20 MG tablet TAKE 1 TABLET(20 MG) BY MOUTH THREE TIMES DAILY 270 tablet 2  . betamethasone dipropionate 0.05 % lotion Apply 1 application topically 2 (two) times daily.    . cholecalciferol (VITAMIN D) 1000 UNITS tablet Take 2,000 Units by mouth daily.     . diphenhydrAMINE (BENADRYL) 25 MG tablet Take 25 mg by mouth as needed.    . empagliflozin (JARDIANCE) 10 MG TABS tablet Take 10 mg by mouth daily.    Marland Kitchen glucose blood test strip 1 each by Other route daily. One Touch Ultra     . ibuprofen (ADVIL) 200 MG tablet Take 200 mg by mouth every 6 (six) hours as needed.      Marland Kitchen ketoconazole (NIZORAL) 2 % cream Apply 1 application topically daily. 60 g 6  . Lidocaine, Anorectal, 5 % CREA Apply topically as needed.    . Magnesium Oxide 500 MG (LAX) TABS Take by mouth daily. Take 1-4 tablets daily.    . metFORMIN (GLUCOPHAGE) 500 MG tablet Take 1,000 mg by mouth 2 (two) times daily with a meal.     . NON FORMULARY Lubricant eye ointment- sterile mineral oil 39.9% White Petroleum 57.7%    . NON FORMULARY Biotene dry mouth oral rinse    . omeprazole (PRILOSEC) 20 MG capsule Take 1 capsule (20 mg total) by mouth daily. 90 capsule 2  . Polyethyl Glycol-Propyl Glycol (SYSTANE) 0.4-0.3 % GEL Apply to eye.    Marland Kitchen PRESCRIPTION MEDICATION prevident 5000- dry mouth toothpaste    . Probiotic Product (DIGESTIVE ADVANTAGE GUMMIES PO) Take by mouth 2 (two) times daily.    Marland Kitchen RA SUNSCREEN SPF50 EX Apply topically as needed.      . valsartan (DIOVAN) 80 MG tablet TAKE 1 TABLET(80 MG) BY MOUTH DAILY 90 tablet 2   No facility-administered medications prior to visit.     Allergies  Allergen Reactions  . Penicillins Swelling    Swelling of tongue.  . Sulfonamide Derivatives Swelling    REACTION: Swelling of tongue and face  . Acetazolamide   . Azathioprine Other (See Comments)     Unknown  . Ezetimibe Other (See Comments)    Unknown  . Lisinopril Other (See Comments)    Unknown  . Meloxicam Other (See Comments)    Unknown  . Pravastatin Sodium Other (See Comments)    Unknown  . Simvastatin Other (See Comments)    Unknown  . Latex Hives and Rash    Review of Systems  Constitutional: Negative for fever and malaise/fatigue.  HENT: Negative for congestion.   Eyes: Negative for blurred vision.  Respiratory: Negative for shortness of breath.   Cardiovascular: Negative for chest  pain, palpitations and leg swelling.  Gastrointestinal: Negative for abdominal pain, blood in stool and nausea.  Genitourinary: Negative for dysuria and frequency.  Musculoskeletal: Negative for falls.  Skin: Negative for rash.  Neurological: Negative for dizziness, loss of consciousness and headaches.  Endo/Heme/Allergies: Negative for environmental allergies.  Psychiatric/Behavioral: Negative for depression. The patient is not nervous/anxious.        Objective:    Physical Exam  Constitutional: She is oriented to person, place, and time. She appears well-developed and well-nourished. No distress.  HENT:  Head: Normocephalic and atraumatic.  Nose: Nose normal.  Eyes: Right eye exhibits no discharge. Left eye exhibits no discharge.  Neck: Normal range of motion. Neck supple.  Cardiovascular: Normal rate and regular rhythm.   No murmur heard. Pulmonary/Chest: Effort normal and breath sounds normal.  Abdominal: Soft. Bowel sounds are normal. There is no tenderness.  Musculoskeletal: She exhibits no edema.  Neurological: She is alert and oriented to person, place, and time.  Skin: Skin is warm and dry.  Psychiatric: She has a normal mood and affect.  Nursing note and vitals reviewed.   BP 120/68 (BP Location: Left Arm, Patient Position: Sitting, Cuff Size: Normal)   Pulse 93   Temp 98.2 F (36.8 C) (Oral)   Ht 5\' 5"  (1.651 m)   Wt 172 lb (78 kg)   SpO2 97%   BMI 28.62  kg/m  Wt Readings from Last 3 Encounters:  06/07/16 172 lb (78 kg)  06/06/16 173 lb 6.4 oz (78.7 kg)  03/29/16 174 lb 12.8 oz (79.3 kg)     Lab Results  Component Value Date   WBC 7.2 06/07/2016   HGB 12.7 06/07/2016   HCT 40.2 06/07/2016   PLT 499.0 (H) 06/07/2016   GLUCOSE 99 06/07/2016   CHOL 177 06/07/2016   TRIG 149.0 06/07/2016   HDL 41.40 06/07/2016   LDLDIRECT 95 12/22/2009   LDLCALC 106 (H) 06/07/2016   ALT 15 06/07/2016   AST 13 06/07/2016   NA 141 06/07/2016   K 4.0 06/07/2016   CL 103 06/07/2016   CREATININE 0.88 06/07/2016   BUN 13 06/07/2016   CO2 27 06/07/2016   TSH 1.34 06/07/2016   HGBA1C 6.1 04/18/2016   HGBA1C 5.9 04/18/2016   MICROALBUR 4.05 (H) 03/22/2014    Lab Results  Component Value Date   TSH 1.34 06/07/2016   Lab Results  Component Value Date   WBC 7.2 06/07/2016   HGB 12.7 06/07/2016   HCT 40.2 06/07/2016   MCV 77.1 (L) 06/07/2016   PLT 499.0 (H) 06/07/2016   Lab Results  Component Value Date   NA 141 06/07/2016   K 4.0 06/07/2016   CO2 27 06/07/2016   GLUCOSE 99 06/07/2016   BUN 13 06/07/2016   CREATININE 0.88 06/07/2016   BILITOT 0.4 06/07/2016   ALKPHOS 112 06/07/2016   AST 13 06/07/2016   ALT 15 06/07/2016   PROT 8.2 06/07/2016   ALBUMIN 4.6 06/07/2016   CALCIUM 10.3 06/07/2016   ANIONGAP 13.4 12/22/2009   GFR 84.48 06/07/2016   Lab Results  Component Value Date   CHOL 177 06/07/2016   Lab Results  Component Value Date   HDL 41.40 06/07/2016   Lab Results  Component Value Date   LDLCALC 106 (H) 06/07/2016   Lab Results  Component Value Date   TRIG 149.0 06/07/2016   Lab Results  Component Value Date   CHOLHDL 4 06/07/2016   Lab Results  Component Value Date  HGBA1C 6.1 04/18/2016   HGBA1C 5.9 04/18/2016       Assessment & Plan:   Problem List Items Addressed This Visit    Diabetes mellitus type 2 in nonobese (HCC)    hgba1c acceptable, minimize simple carbs. Increase exercise as tolerated.  Continue current meds      Vitamin D deficiency    Checked today, now WNL      Relevant Orders   Vitamin D (25 hydroxy) (Completed)   Hyperlipidemia, mixed    Encouraged heart healthy diet, increase exercise, avoid trans fats, consider a krill oil cap daily      Relevant Orders   Lipid panel (Completed)   Essential hypertension    Well controlled, no changes to meds. Encouraged heart healthy diet such as the DASH diet and exercise as tolerated.       Relevant Orders   TSH (Completed)   CBC (Completed)   Comprehensive metabolic panel (Completed)   Constipation    Encouraged increased hydration and fiber in diet. Daily probiotics.       Thrombocytosis (HCC)    Mild, asymptomatic,        Other Visit Diagnoses    Encounter for immunization       Relevant Orders   Flu Vaccine QUAD 36+ mos IM (Completed)      I am having Ms. Lovena Le maintain her metFORMIN, cholecalciferol, glucose blood, RA SUNSCREEN SPF50 EX, ibuprofen, Magnesium Oxide, diphenhydrAMINE, Lidocaine (Anorectal), PRESCRIPTION MEDICATION, NON FORMULARY, NON FORMULARY, Polyethyl Glycol-Propyl Glycol, ketoconazole, Probiotic Product (DIGESTIVE ADVANTAGE GUMMIES PO), empagliflozin, amLODipine, omeprazole, atorvastatin, valsartan, alclomethasone, and betamethasone dipropionate.  No orders of the defined types were placed in this encounter.    [name], MD

## 2016-07-27 IMAGING — MG MM DIGITAL SCREENING BILATERAL
4 series · 4 of 4 positions shown · non-contrast
Comparison: Previous exam(s).

CLINICAL DATA: Screening.

EXAM:
DIGITAL SCREENING BILATERAL MAMMOGRAM WITH CAD

[L CC]
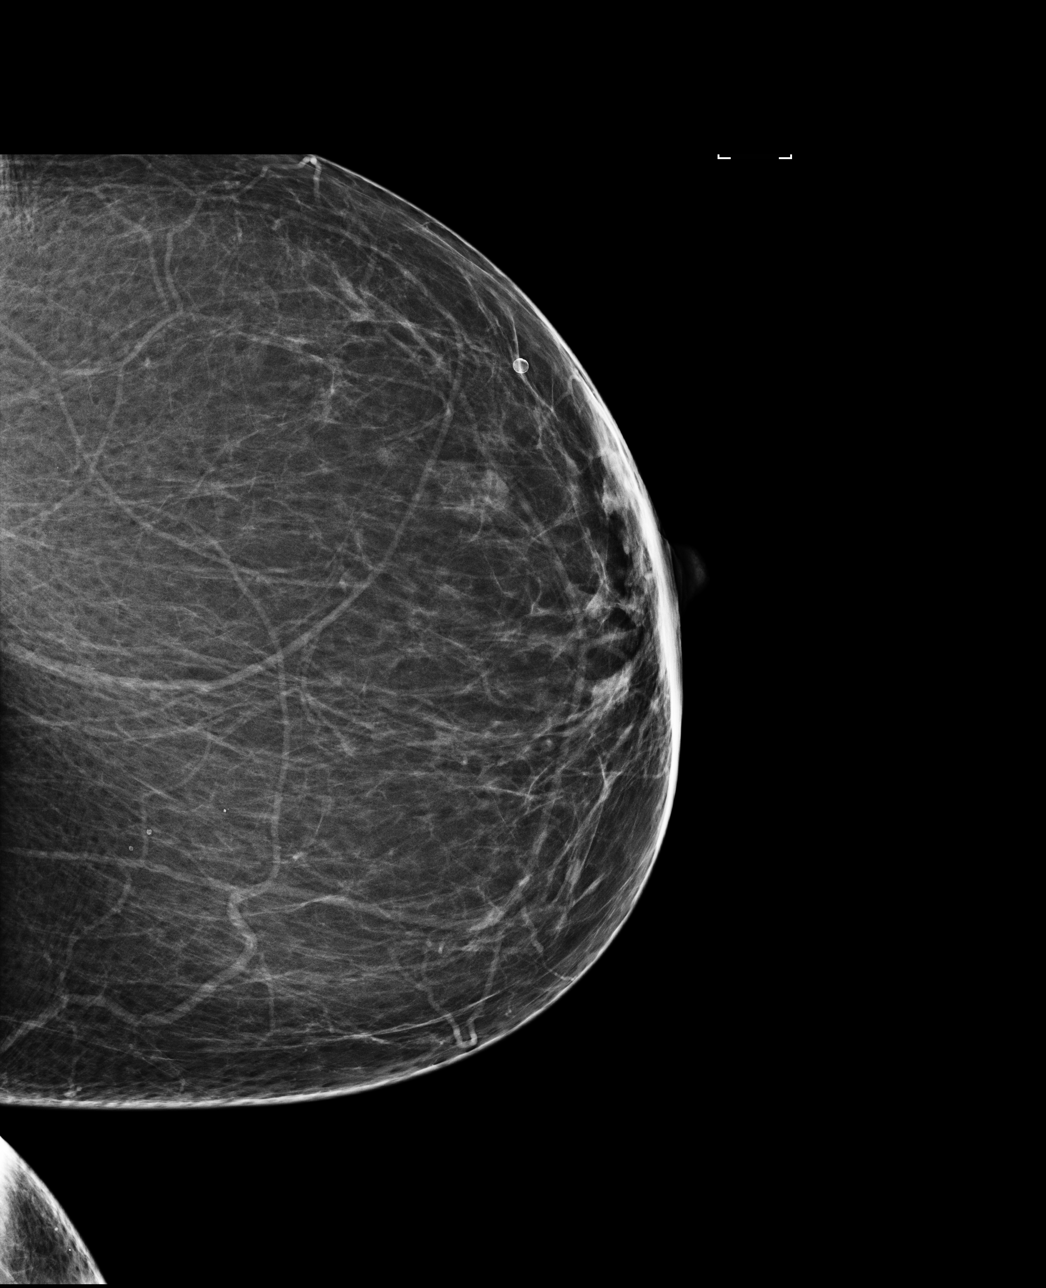

[R CC]
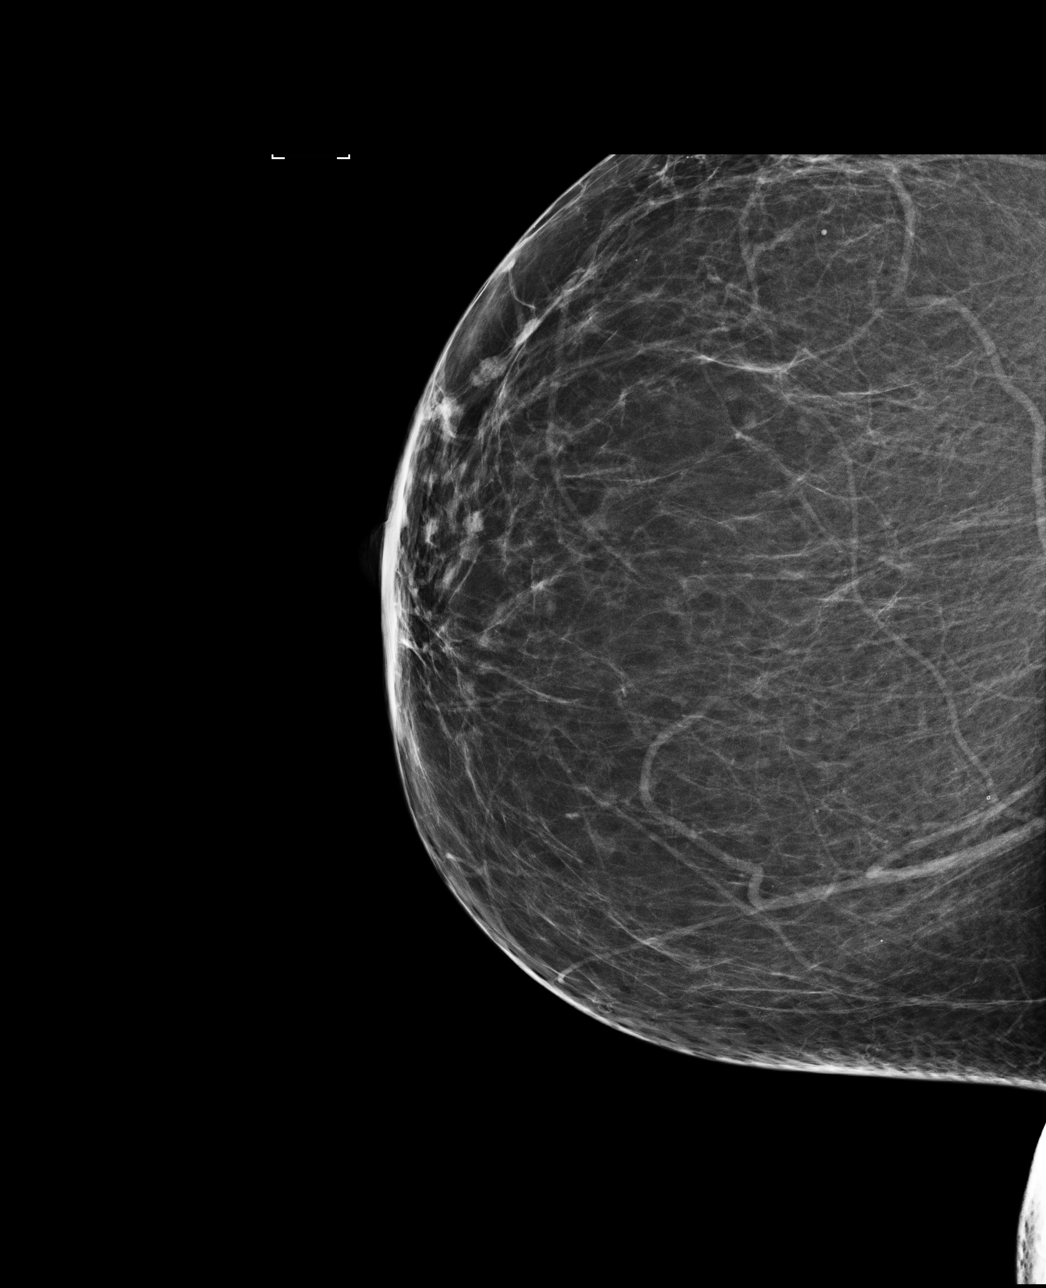

[L MLO]
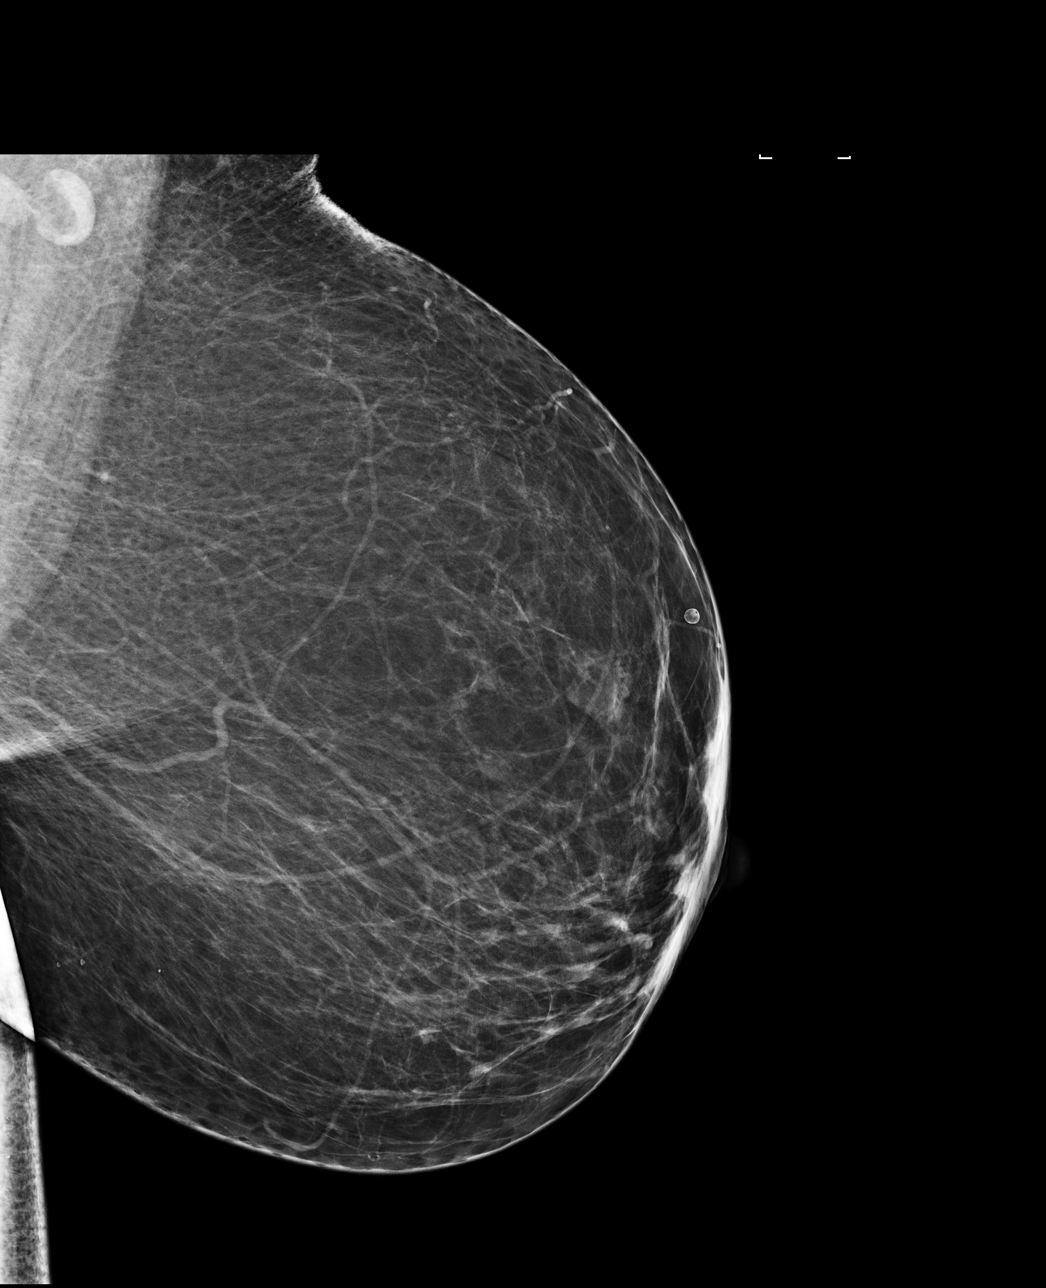

[R MLO]
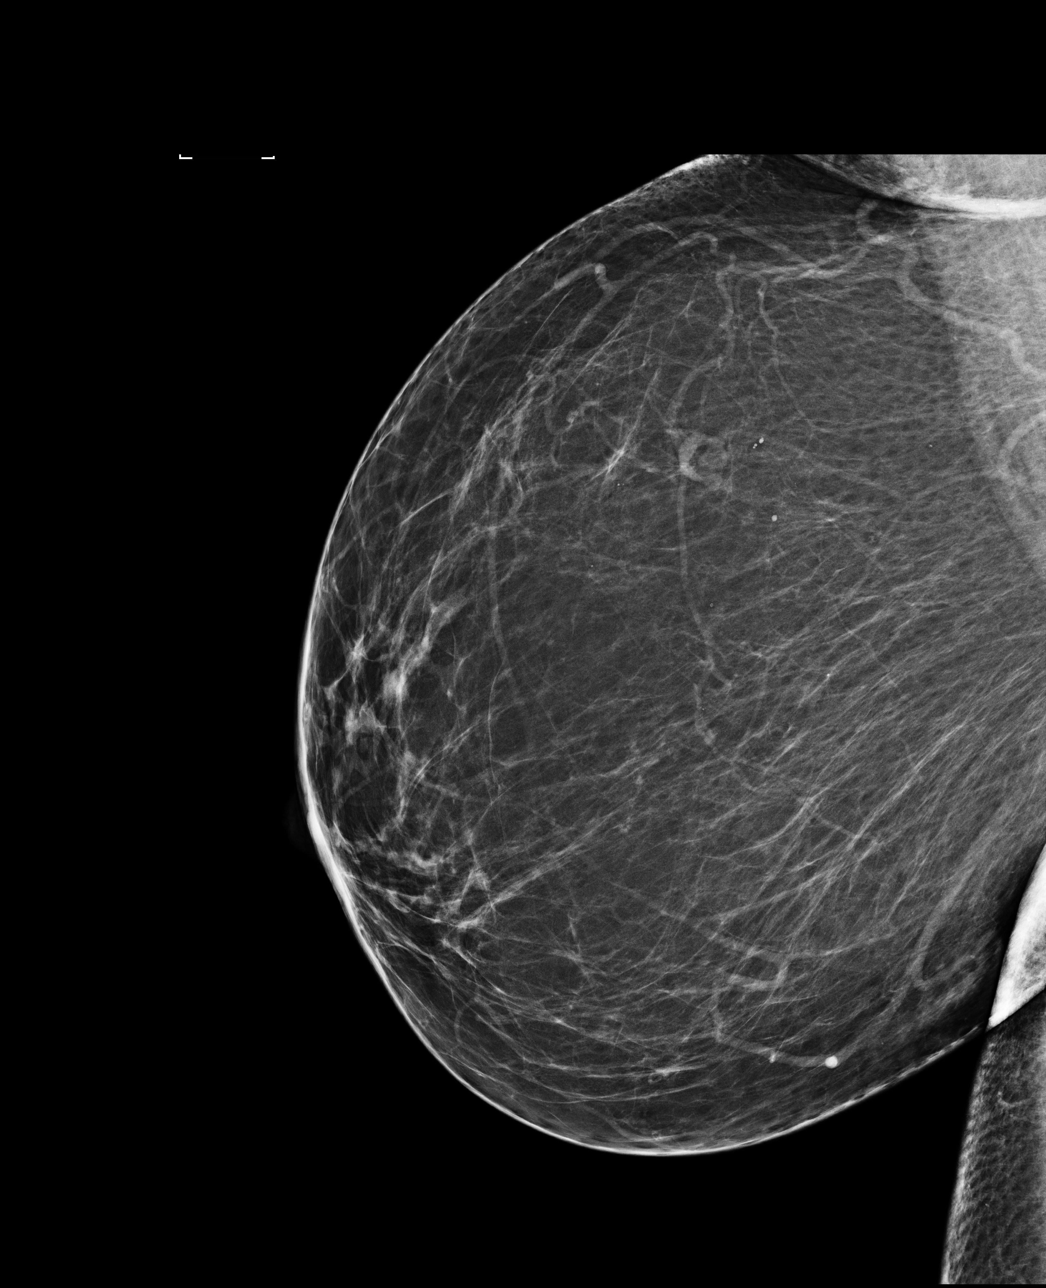

[4 of 4 positions shown; findings below may reference images not displayed]

ACR Breast Density Category b: There are scattered areas of
fibroglandular density.
FINDINGS: There are no findings suspicious for malignancy. Images were
processed with CAD.
IMPRESSION: No mammographic evidence of malignancy. A result letter of this
screening mammogram will be mailed directly to the patient.

RECOMMENDATION:
Screening mammogram in one year. (Code:AS-G-LCT)

BI-RADS CATEGORY  1: Negative.

## 2016-07-31 LAB — HM DIABETES EYE EXAM

## 2016-08-24 ENCOUNTER — Encounter: Payer: Self-pay | Admitting: Family Medicine

## 2016-09-07 ENCOUNTER — Ambulatory Visit (INDEPENDENT_AMBULATORY_CARE_PROVIDER_SITE_OTHER): Payer: BC Managed Care – PPO | Admitting: Family Medicine

## 2016-09-07 ENCOUNTER — Encounter: Payer: Self-pay | Admitting: Family Medicine

## 2016-09-07 VITALS — BP 120/68 | HR 98 | Temp 98.1°F | Wt 176.4 lb

## 2016-09-07 DIAGNOSIS — H6121 Impacted cerumen, right ear: Secondary | ICD-10-CM

## 2016-09-07 DIAGNOSIS — I10 Essential (primary) hypertension: Secondary | ICD-10-CM

## 2016-09-07 DIAGNOSIS — E119 Type 2 diabetes mellitus without complications: Secondary | ICD-10-CM

## 2016-09-07 DIAGNOSIS — R700 Elevated erythrocyte sedimentation rate: Secondary | ICD-10-CM | POA: Diagnosis not present

## 2016-09-07 DIAGNOSIS — E559 Vitamin D deficiency, unspecified: Secondary | ICD-10-CM

## 2016-09-07 DIAGNOSIS — D75839 Thrombocytosis, unspecified: Secondary | ICD-10-CM

## 2016-09-07 DIAGNOSIS — D473 Essential (hemorrhagic) thrombocythemia: Secondary | ICD-10-CM

## 2016-09-07 LAB — CBC WITH DIFFERENTIAL/PLATELET
BASOS ABS: 0 10*3/uL (ref 0.0–0.1)
BASOS PCT: 0.3 % (ref 0.0–3.0)
EOS ABS: 0.1 10*3/uL (ref 0.0–0.7)
Eosinophils Relative: 0.8 % (ref 0.0–5.0)
HEMATOCRIT: 38.5 % (ref 36.0–46.0)
HEMOGLOBIN: 12.4 g/dL (ref 12.0–15.0)
LYMPHS PCT: 29.1 % (ref 12.0–46.0)
Lymphs Abs: 2.1 10*3/uL (ref 0.7–4.0)
MCHC: 32.3 g/dL (ref 30.0–36.0)
MCV: 76.1 fl — AB (ref 78.0–100.0)
MONOS PCT: 5.5 % (ref 3.0–12.0)
Monocytes Absolute: 0.4 10*3/uL (ref 0.1–1.0)
NEUTROS ABS: 4.6 10*3/uL (ref 1.4–7.7)
NEUTROS PCT: 64.3 % (ref 43.0–77.0)
PLATELETS: 518 10*3/uL — AB (ref 150.0–400.0)
RBC: 5.05 Mil/uL (ref 3.87–5.11)
RDW: 18 % — ABNORMAL HIGH (ref 11.5–15.5)
WBC: 7.2 10*3/uL (ref 4.0–10.5)

## 2016-09-07 LAB — SEDIMENTATION RATE: Sed Rate: 25 mm/hr (ref 0–30)

## 2016-09-07 NOTE — Progress Notes (Signed)
Pre visit review using our clinic review tool, if applicable. No additional management support is needed unless otherwise documented below in the visit note. 

## 2016-09-07 NOTE — Patient Instructions (Signed)
Hypoglycemia ° Hypoglycemia is when the sugar (glucose) level in the blood is too low. Symptoms of low blood sugar may include: °· Feeling: °¨ Hungry. °¨ Worried or nervous (anxious). °¨ Sweaty and clammy. °¨ Confused. °¨ Dizzy. °¨ Sleepy. °¨ Sick to your stomach (nauseous). °· Having: °¨ A fast heartbeat. °¨ A headache. °¨ A change in your vision. °¨ Jerky movements that you cannot control (seizure). °¨ Nightmares. °¨ Tingling or no feeling (numbness) around the mouth, lips, or tongue. °· Having trouble with: °¨ Talking. °¨ Paying attention (concentrating). °¨ Moving (coordination). °¨ Sleeping. °· Shaking. °· Passing out (fainting). °· Getting upset easily (irritability). °Low blood sugar can happen to people who have diabetes and people who do not have diabetes. Low blood sugar can happen quickly, and it can be an emergency. °Treating Low Blood Sugar  °Low blood sugar is often treated by eating or drinking something sugary right away. If you can think clearly and swallow safely, follow the 15:15 rule: °· Take 15 grams of a fast-acting carb (carbohydrate). Some fast-acting carbs are: °¨ 1 tube of glucose gel. °¨ 3 sugar tablets (glucose pills). °¨ 6-8 pieces of hard candy. °¨ 4 oz (120 mL) of fruit juice. °¨ 4 oz (120 mL) of regular (not diet) soda. °· Check your blood sugar 15 minutes after you take the carb. °· If your blood sugar is still at or below 70 mg/dL (3.9 mmol/L), take 15 grams of a carb again. °· If your blood sugar does not go above 70 mg/dL (3.9 mmol/L) after 3 tries, get help right away. °· After your blood sugar goes back to normal, eat a meal or a snack within 1 hour. °Treating Very Low Blood Sugar  °If your blood sugar is at or below 54 mg/dL (3 mmol/L), you have very low blood sugar (severe hypoglycemia). This is an emergency. Do not wait to see if the symptoms will go away. Get medical help right away. Call your local emergency services (911 in the [place] Do not drive yourself to the  hospital. °If you have very low blood sugar and you cannot eat or drink, you may need a glucagon shot (injection). A family member or friend should learn how to check your blood sugar and how to give you a glucagon shot. Ask your doctor if you need to have a glucagon shot kit at home. °Follow these instructions at home: °General instructions °· Avoid any diets that cause you to not eat enough food. Talk with your doctor before you start any new diet. °· Take over-the-counter and prescription medicines only as told by your doctor. °· Limit alcohol to no more than 1 drink per day for nonpregnant women and 2 drinks per day for men. One drink equals 12 oz of beer, 5 oz of wine, or 1½ oz of hard liquor. °· Keep all follow-up visits as told by your doctor. This is important. °If You Have Diabetes:  °· Make sure you know the symptoms of low blood sugar. °· Always keep a source of sugar with you, such as: °¨ Sugar. °¨ Sugar tablets. °¨ Glucose gel. °¨ Fruit juice. °¨ Regular soda (not diet soda). °¨ Milk. °¨ Hard candy. °¨ Honey. °· Take your medicines as told. °· Follow your exercise and meal plan. °¨ Eat on time. Do not skip meals. °¨ Follow your sick day plan when you cannot eat or drink normally. Make this plan ahead of time with your doctor. °· Check your blood sugar as   often as told by your doctor. Always check before and after exercise. °· Share your diabetes care plan with: °¨ Your work or school. °¨ People you live with. °· Check your pee (urine) for ketones: °¨ When you are sick. °¨ As told by your doctor. °· Carry a card or wear jewelry that says you have diabetes. °If You Have Low Blood Sugar From Other Causes:  °· Check your blood sugar as often as told by your doctor. °· Follow instructions from your doctor about what you cannot eat or drink. °Contact a doctor if: °· You have trouble keeping your blood sugar in your target range. °· You have low blood sugar often. °Get help right away if: °· You still have  symptoms after you eat or drink something sugary. °· Your blood sugar is at or below 54 mg/dL (3 mmol/L). °· You have jerky movements that you cannot control. °· You pass out. °These symptoms may be an emergency. Do not wait to see if the symptoms will go away. Get medical help right away. Call your local emergency services (911 in the [place] Do not drive yourself to the hospital.  °This information is not intended to replace advice given to you by your health care provider. Make sure you discuss any questions you have with your health care provider. °Document Released: 10/24/2009 Document Revised: 01/05/2016 Document Reviewed: 09/02/2015 °Elsevier Interactive Patient Education © 2017 [employer] ° °

## 2016-09-07 NOTE — Progress Notes (Signed)
Subjective:    Patient ID: Lori Zamora, female    DOB: Jan 22, 1956, 61 y.o.   MRN: [id number]  Chief Complaint  Patient presents with  . Follow-up  I acted as a Education administrator for Dr. Charlett Blake. Princess, RMA   HPI Patient is in today for follow up on Diabetes, chronic pain, , HTN, constipation along with other chronic issues. She feels well today. Continues to endorse chronic diffuse pain and fatigue but it is stable. Is noting some persistent facial edema but not worsening. No recent illness or other acute illness. Denies CP/palp/SOB/HA/congestion/fevers/GI or GU c/o. Taking meds as prescribed  Past Medical History:  Diagnosis Date  . Abdominal pain, unspecified site 11/29/2013  . Acquired acanthosis nigricans   . Anemia, unspecified   . 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 Bipolar 2 disorder ([place]) 01/31/2010   Qualifier: Diagnosis of  By: Fuller Plan CMA (AAMA), Terri Skains  Follows with Dr Letta Moynahan of psychiatry   . Bipolar disorder, unspecified   . CFS (chronic fatigue syndrome)   . Degeneration of intervertebral disc, site unspecified   . Depressive disorder, not elsewhere classified   . Esophageal reflux   . Gastroparesis 06/07/2015  . Intertriginous candidiasis 11/29/2013  . Microcytic anemia 01/31/2010   Qualifier: Diagnosis of  By: Fuller Plan CMA (AAMA), [place]    . Migraine with aura, without mention of intractable migraine without mention of status migrainosus   . Myalgia and myositis, unspecified   . Nontoxic multinodular goiter   . Other and unspecified hyperlipidemia   . Other malaise and fatigue   . Other specified iron deficiency anemias   . Other thalassemia ([place])   . Parotid gland enlargement 2011   related to connective tissue syndrome  . Pelvic floor dysfunction 01/27/2013  . Preventative health care 09/12/2014  . Renal insufficiency 01/27/2013  . Sjogren's syndrome ([place])   . Small intestinal bacterial overgrowth 11/29/2013  . Systemic lupus erythematosus  ([place])   . Thrombocytosis ([place]) 06/07/2016  . Type II or unspecified type diabetes mellitus without mention of complication, not stated as uncontrolled   . Unspecified constipation   . Unspecified diffuse connective tissue disease 01/27/2013   Follows at [facility] rheumatology, Dr Alanda Amass Per patient has tested 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 antibody  . Unspecified essential hypertension   . Unspecified sleep apnea   . Unspecified vitamin D deficiency     Past Surgical History:  Procedure Laterality Date  . BIOPSY THYROID     01/13/2015, 3 previous biopsy reported all benign  . LAPAROSCOPIC TOTAL HYSTERECTOMY  2004    Family History  Problem Relation Age of Onset  . Diabetes Mother   . Heart disease Mother     family history  . Allergies Mother   . Alcohol abuse      Family history of alcoholism and addiction  . Alcohol abuse Brother     drug abuse  . Heart disease Maternal Grandmother   . Heart disease Maternal Grandfather   . Obesity Sister   . Hypertension      family history  . Kidney disease      family history  . Stroke      1st degree relative <60    Social History   Social History  . Marital status: Single    Spouse name: N/A  . Number of children: 0  . Years of education: N/A   Occupational History  . Teacher    Social History  Main Topics  . Smoking status: Never Smoker  . Smokeless tobacco: Never Used  . Alcohol use No  . Drug use: No  . Sexual activity: No     Comment: lives alone, no major dietary restrictions, retired from teaching kindergarten   Other Topics Concern  . 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 alclomethasone (ACLOVATE) 0.05 % cream Apply 1 application topically 2 (two) times daily.    Marland Kitchen amLODipine (NORVASC) 5 MG tablet 1 tab po bid 180 tablet 3  .  atorvastatin (LIPITOR) 20 MG tablet TAKE 1 TABLET(20 MG) BY MOUTH THREE TIMES DAILY 270 tablet 2  . betamethasone dipropionate 0.05 % lotion Apply 1 application topically 2 (two) times daily.    . cholecalciferol (VITAMIN D) 1000 UNITS tablet Take 2,000 Units by mouth daily.     . diphenhydrAMINE (BENADRYL) 25 MG tablet Take 25 mg by mouth as needed.    . empagliflozin (JARDIANCE) 10 MG TABS tablet Take 10 mg by mouth daily.    Marland Kitchen glucose blood test strip 1 each by Other route daily. One Touch Ultra     . ibuprofen (ADVIL) 200 MG tablet Take 200 mg by mouth every 6 (six) hours as needed.      Marland Kitchen ketoconazole (NIZORAL) 2 % cream Apply 1 application topically daily. 60 g 6  . Lidocaine, Anorectal, 5 % CREA Apply topically as needed.    . Magnesium Oxide 500 MG (LAX) TABS Take by mouth daily. Take 1-4 tablets daily.    . metFORMIN (GLUCOPHAGE) 500 MG tablet Take 1,000 mg by mouth 2 (two) times daily with a meal.     . NON FORMULARY Lubricant eye ointment- sterile mineral oil 39.9% White Petroleum 57.7%    . NON FORMULARY Biotene dry mouth oral rinse    . omeprazole (PRILOSEC) 20 MG capsule Take 1 capsule (20 mg total) by mouth daily. 90 capsule 2  . Polyethyl Glycol-Propyl Glycol (SYSTANE) 0.4-0.3 % GEL Apply to eye.    Marland Kitchen PRESCRIPTION MEDICATION prevident 5000- dry mouth toothpaste    . Probiotic Product (DIGESTIVE ADVANTAGE GUMMIES PO) Take by mouth 2 (two) times daily.    Marland Kitchen RA SUNSCREEN SPF50 EX Apply topically as needed.      . valsartan (DIOVAN) 80 MG tablet TAKE 1 TABLET(80 MG) BY MOUTH DAILY 90 tablet 2   No facility-administered medications prior to visit.     Allergies  Allergen Reactions  . Penicillins Swelling    Swelling of tongue.  . Sulfonamide Derivatives Swelling    REACTION: Swelling of tongue and face  . Acetazolamide   . Azathioprine Other (See Comments)    Unknown  . Ezetimibe Other (See Comments)    Unknown  . Lisinopril Other (See Comments)    Unknown  . Meloxicam  Other (See Comments)    Unknown  . Pravastatin Sodium Other (See Comments)    Unknown  . Simvastatin Other (See Comments)    Unknown  . Latex Hives and Rash    Review of Systems  Constitutional: Positive for malaise/fatigue. Negative for fever.  HENT: Negative for congestion.   Eyes: Negative for blurred vision.  Respiratory: Negative for cough and shortness of breath.   Cardiovascular: Negative for chest pain, palpitations and leg swelling.  Gastrointestinal: Negative for vomiting.  Musculoskeletal: Positive for back pain, joint pain, myalgias  and neck pain.  Skin: Negative for rash.  Neurological: Negative for loss of consciousness and headaches.       Objective:    Physical Exam  Constitutional: She is oriented to person, place, and time. She appears well-developed and well-nourished. No distress.  HENT:  Head: Normocephalic and atraumatic.  Eyes: Conjunctivae are normal.  Neck: Normal range of motion. No thyromegaly present.  Cardiovascular: Normal rate and regular rhythm.   Murmur heard. Pulmonary/Chest: Effort normal and breath sounds normal. She has no wheezes.  Abdominal: Soft. Bowel sounds are normal. There is no tenderness.  Musculoskeletal: Normal range of motion. She exhibits no edema or deformity.  Neurological: She is alert and oriented to person, place, and time.  Skin: Skin is warm and dry. She is not diaphoretic.  Psychiatric: She has a normal mood and affect.    BP 120/68 (BP Location: Left Arm, Patient Position: Sitting, Cuff Size: Normal)   Pulse 98   Temp 98.1 F (36.7 C) (Oral)   Wt 176 lb 6.4 oz (80 kg)   SpO2 94%   BMI 29.35 kg/m  Wt Readings from Last 3 Encounters:  09/07/16 176 lb 6.4 oz (80 kg)  06/07/16 172 lb (78 kg)  06/06/16 173 lb 6.4 oz (78.7 kg)     Lab Results  Component Value Date   WBC 7.2 09/07/2016   HGB 12.4 09/07/2016   HCT 38.5 09/07/2016   PLT 518.0 (H) 09/07/2016   GLUCOSE 99 06/07/2016   CHOL 177 06/07/2016    TRIG 149.0 06/07/2016   HDL 41.40 06/07/2016   LDLDIRECT 95 12/22/2009   LDLCALC 106 (H) 06/07/2016   ALT 15 06/07/2016   AST 13 06/07/2016   NA 141 06/07/2016   K 4.0 06/07/2016   CL 103 06/07/2016   CREATININE 0.88 06/07/2016   BUN 13 06/07/2016   CO2 27 06/07/2016   TSH 1.34 06/07/2016   HGBA1C 6.1 04/18/2016   HGBA1C 5.9 04/18/2016   MICROALBUR 4.05 (H) 03/22/2014    Lab Results  Component Value Date   TSH 1.34 06/07/2016   Lab Results  Component Value Date   WBC 7.2 09/07/2016   HGB 12.4 09/07/2016   HCT 38.5 09/07/2016   MCV 76.1 (L) 09/07/2016   PLT 518.0 (H) 09/07/2016   Lab Results  Component Value Date   NA 141 06/07/2016   K 4.0 06/07/2016   CO2 27 06/07/2016   GLUCOSE 99 06/07/2016   BUN 13 06/07/2016   CREATININE 0.88 06/07/2016   BILITOT 0.4 06/07/2016   ALKPHOS 112 06/07/2016   AST 13 06/07/2016   ALT 15 06/07/2016   PROT 8.2 06/07/2016   ALBUMIN 4.6 06/07/2016   CALCIUM 10.3 06/07/2016   ANIONGAP 13.4 12/22/2009   GFR 84.48 06/07/2016   Lab Results  Component Value Date   CHOL 177 06/07/2016   Lab Results  Component Value Date   HDL 41.40 06/07/2016   Lab Results  Component Value Date   LDLCALC 106 (H) 06/07/2016   Lab Results  Component Value Date   TRIG 149.0 06/07/2016   Lab Results  Component Value Date   CHOLHDL 4 06/07/2016   Lab Results  Component Value Date   HGBA1C 6.1 04/18/2016   HGBA1C 5.9 04/18/2016       Assessment & Plan:   Problem List Items Addressed This Visit    Diabetes mellitus type 2 in nonobese ([place])    minimize simple carbs. Increase exercise as tolerated.  Vitamin D deficiency    Needs daily supplement      Essential hypertension    Well controlled, no changes to meds. Encouraged heart healthy diet such as the DASH diet and exercise as tolerated.       Cerumen impaction    No wax seen in ears today or other lesions despite a sense of fullness in her ears, b/l      Thrombocytosis  (HCC)   Relevant Orders   CBC w/Diff (Completed)   Elevated sed rate - Primary    Improved since last check unclear etiology will continue to monitor      Relevant Orders   Sed Rate (ESR) (Completed)      I am having Ms. Lovena Le maintain her metFORMIN, cholecalciferol, glucose blood, RA SUNSCREEN SPF50 EX, ibuprofen, Magnesium Oxide, diphenhydrAMINE, Lidocaine (Anorectal), PRESCRIPTION MEDICATION, NON FORMULARY, NON FORMULARY, Polyethyl Glycol-Propyl Glycol, ketoconazole, Probiotic Product (DIGESTIVE ADVANTAGE GUMMIES PO), empagliflozin, amLODipine, omeprazole, atorvastatin, valsartan, alclomethasone, and betamethasone dipropionate.  No orders of the defined types were placed in this encounter.   CMA served as Education administrator during this visit. History, Physical and Plan performed by medical provider. Documentation and orders reviewed and attested to.  [name], MD

## 2016-09-09 DIAGNOSIS — R700 Elevated erythrocyte sedimentation rate: Secondary | ICD-10-CM | POA: Insufficient documentation

## 2016-09-09 NOTE — Assessment & Plan Note (Signed)
No wax seen in ears today or other lesions despite a sense of fullness in her ears, b/l

## 2016-09-09 NOTE — Assessment & Plan Note (Signed)
Improved since last check unclear etiology will continue to monitor

## 2016-09-09 NOTE — Assessment & Plan Note (Signed)
minimize simple carbs. Increase exercise as tolerated.  

## 2016-09-09 NOTE — Assessment & Plan Note (Signed)
Well controlled, no changes to meds. Encouraged heart healthy diet such as the DASH diet and exercise as tolerated.  °

## 2016-09-09 NOTE — Assessment & Plan Note (Signed)
Needs daily supplement 

## 2016-09-19 ENCOUNTER — Other Ambulatory Visit: Payer: Self-pay | Admitting: Family Medicine

## 2016-10-17 LAB — HM DIABETES EYE EXAM

## 2016-11-07 ENCOUNTER — Ambulatory Visit (INDEPENDENT_AMBULATORY_CARE_PROVIDER_SITE_OTHER): Payer: BC Managed Care – PPO | Admitting: Podiatry

## 2016-11-07 DIAGNOSIS — M79672 Pain in left foot: Secondary | ICD-10-CM | POA: Diagnosis not present

## 2016-11-07 DIAGNOSIS — M779 Enthesopathy, unspecified: Secondary | ICD-10-CM

## 2016-11-07 DIAGNOSIS — M79671 Pain in right foot: Secondary | ICD-10-CM | POA: Diagnosis not present

## 2016-11-07 NOTE — Progress Notes (Signed)
   Subjective:    Patient ID: [name], female    DOB: 06/20/1956, 61 y.o.   MRN: [id number]  HPI Diabetic foot exam and evaluate for new orthotics.    Review of Systems  Constitutional: Positive for fatigue.  HENT: Positive for sneezing.   Eyes: Positive for itching.  Cardiovascular: Positive for leg swelling.  Gastrointestinal: Positive for abdominal distention and constipation.  Endocrine: Positive for cold intolerance.  Musculoskeletal: Positive for joint swelling.  Skin: Positive for rash.  Hematological: Bruises/bleeds easily.  All other systems reviewed and are negative.      Objective:   Physical Exam        Assessment & Plan:

## 2016-11-07 NOTE — Progress Notes (Signed)
Subjective:     Patient ID: Lori Zamora, female   DOB: [birth year], 61 y.o.   MRN: [id number]  HPI patient presents stating that her feet have started to hurt again and she's not had orthotics in over 5 years and feels like they're wearing out and she wanted her diabetes checked   Review of Systems  All other systems reviewed and are negative.      Objective:   Physical Exam  Constitutional: She is oriented to person, place, and time.  Cardiovascular: Intact distal pulses.   Musculoskeletal: Normal range of motion.  Neurological: She is oriented to person, place, and time.  Skin: Skin is warm.  Nursing note and vitals reviewed.  neurovascular status intact muscle strength was adequate with patient noted to have moderate depression of the arch bilateral and reduction of sharp dull and vibratory. Patient has good digital perfusion well oriented with adequate hair growth and does have moderate discomfort secondary to foot structure     Assessment:     Chronic tendinitis controlled well with orthotics    Plan:     H&P x-rays and conditions reviewed and recommended orthotics. Patient is scanned for customized orthotics to lift the arch and take stress off the feet and I did go over routine diabetic foot issues with her

## 2016-11-19 ENCOUNTER — Other Ambulatory Visit: Payer: Self-pay | Admitting: Family Medicine

## 2016-11-28 ENCOUNTER — Other Ambulatory Visit: Payer: BC Managed Care – PPO

## 2016-12-01 ENCOUNTER — Other Ambulatory Visit: Payer: Self-pay | Admitting: Family Medicine

## 2016-12-06 ENCOUNTER — Ambulatory Visit: Payer: BC Managed Care – PPO | Admitting: Family Medicine

## 2016-12-21 ENCOUNTER — Other Ambulatory Visit: Payer: Self-pay | Admitting: Family Medicine

## 2016-12-21 DIAGNOSIS — L309 Dermatitis, unspecified: Secondary | ICD-10-CM

## 2017-01-14 ENCOUNTER — Encounter: Payer: Self-pay | Admitting: Family Medicine

## 2017-01-14 ENCOUNTER — Ambulatory Visit (INDEPENDENT_AMBULATORY_CARE_PROVIDER_SITE_OTHER): Payer: BC Managed Care – PPO | Admitting: Family Medicine

## 2017-01-14 VITALS — BP 118/68 | HR 98 | Temp 98.4°F | Resp 18 | Wt 182.2 lb

## 2017-01-14 DIAGNOSIS — I10 Essential (primary) hypertension: Secondary | ICD-10-CM | POA: Diagnosis not present

## 2017-01-14 DIAGNOSIS — M3500 Sicca syndrome, unspecified: Secondary | ICD-10-CM | POA: Diagnosis not present

## 2017-01-14 DIAGNOSIS — E782 Mixed hyperlipidemia: Secondary | ICD-10-CM

## 2017-01-14 DIAGNOSIS — L930 Discoid lupus erythematosus: Secondary | ICD-10-CM | POA: Diagnosis not present

## 2017-01-14 DIAGNOSIS — E119 Type 2 diabetes mellitus without complications: Secondary | ICD-10-CM | POA: Diagnosis not present

## 2017-01-14 DIAGNOSIS — E559 Vitamin D deficiency, unspecified: Secondary | ICD-10-CM | POA: Diagnosis not present

## 2017-01-14 DIAGNOSIS — M329 Systemic lupus erythematosus, unspecified: Secondary | ICD-10-CM | POA: Diagnosis not present

## 2017-01-14 HISTORY — DX: Discoid lupus erythematosus: L93.0

## 2017-01-14 LAB — COMPREHENSIVE METABOLIC PANEL
ALBUMIN: 4.4 g/dL (ref 3.5–5.2)
ALT: 11 U/L (ref 0–35)
AST: 13 U/L (ref 0–37)
Alkaline Phosphatase: 102 U/L (ref 39–117)
BUN: 15 mg/dL (ref 6–23)
CALCIUM: 10.2 mg/dL (ref 8.4–10.5)
CHLORIDE: 104 meq/L (ref 96–112)
CO2: 28 mEq/L (ref 19–32)
Creatinine, Ser: 0.88 mg/dL (ref 0.40–1.20)
GFR: 84.31 mL/min (ref >60.00)
Glucose, Bld: 99 mg/dL (ref 70–99)
POTASSIUM: 4.4 meq/L (ref 3.5–5.1)
SODIUM: 140 meq/L (ref 135–145)
Total Bilirubin: 0.3 mg/dL (ref 0.2–1.2)
Total Protein: 7.6 g/dL (ref 6.0–8.3)

## 2017-01-14 LAB — LIPID PANEL
Cholesterol: 146 mg/dL (ref 0–200)
HDL: 35.9 mg/dL — ABNORMAL LOW (ref >39.00)
LDL CALC: 85 mg/dL (ref 0–99)
NonHDL: 110.43
Total CHOL/HDL Ratio: 4
Triglycerides: 127 mg/dL (ref 0.0–149.0)
VLDL: 25.4 mg/dL (ref 0.0–40.0)

## 2017-01-14 LAB — CBC
HEMATOCRIT: 38.8 % (ref 36.0–46.0)
HEMOGLOBIN: 12 g/dL (ref 12.0–15.0)
MCHC: 31 g/dL (ref 30.0–36.0)
MCV: 77.7 fl — ABNORMAL LOW (ref 78.0–100.0)
Platelets: 524 10*3/uL — ABNORMAL HIGH (ref 150.0–400.0)
RBC: 5 Mil/uL (ref 3.87–5.11)
RDW: 17.6 % — ABNORMAL HIGH (ref 11.5–15.5)
WBC: 7.5 10*3/uL (ref 4.0–10.5)

## 2017-01-14 LAB — TSH: TSH: 1.32 u[IU]/mL (ref 0.35–4.50)

## 2017-01-14 LAB — HEMOGLOBIN A1C: Hgb A1c MFr Bld: 7.2 % — ABNORMAL HIGH (ref 4.6–6.5)

## 2017-01-14 LAB — VITAMIN D 25 HYDROXY (VIT D DEFICIENCY, FRACTURES): VITD: 43.56 ng/mL (ref 30.00–100.00)

## 2017-01-14 LAB — SEDIMENTATION RATE: Sed Rate: 37 mm/hr — ABNORMAL HIGH (ref 0–30)

## 2017-01-14 NOTE — Assessment & Plan Note (Signed)
Struggling with increased dryness in eyes, skin and mucus membranes, is frustrated but trying to stay hydrated

## 2017-01-14 NOTE — Assessment & Plan Note (Signed)
Well controlled, no changes to meds. Encouraged heart healthy diet such as the DASH diet and exercise as tolerated.  °

## 2017-01-14 NOTE — Assessment & Plan Note (Signed)
Diagnosed at [facility]

## 2017-01-14 NOTE — Assessment & Plan Note (Signed)
Encouraged heart healthy diet, increase exercise, avoid trans fats, consider a krill oil cap daily 

## 2017-01-14 NOTE — Assessment & Plan Note (Signed)
Encouraged daily supplements  Check level

## 2017-01-14 NOTE — Patient Instructions (Signed)

## 2017-01-14 NOTE — Progress Notes (Signed)
Subjective:  I acted as a Education administrator for Dr. Charlett Blake. [place], [place]  Patient ID: Lori Zamora, female    DOB: 1956-01-07, 61 y.o.   MRN: [id number]  Chief Complaint  Patient presents with  . Follow-up    HPI  Patient is in today for a follow up. She is following up on her Diabetes  and other medical concerns. Patient has no acute concerns. No recent febrile illness or acute hospitalizations. Denies CP/palp/SOB/HA/congestion/fevers/GI or GU c/o. Taking meds as prescribed  Continues to struggle with unpredictable gastroparesis but manages it well. Is struggling with significant fatigue and complains of dry mucus membranes, dry skin and dry eyes. Had an episode of feeling very lightheaded when outside in the heat but with some water and rest she recovered. She saw her endocrinologist Dr Buddy Duty and her last hgba1c was 6.4 up from 5.9. The difference was they had stopped her Metformin. Her thyroid scan this year was not much changed from the previous year whereas previous years had significant.  Patient Care Team: Mosie Lukes, MD as PCP - General (Family Medicine) Delrae Rend, MD as Consulting Physician (Endocrinology) Victorino Dike, DDS as Consulting Physician (Dentistry) Luberta Mutter, MD as Consulting Physician (Ophthalmology) Charlean Sanfilippo, MD as Referring Physician (Gastroenterology) Newell Coral., MD as Referring Physician (Gastroenterology)   Past Medical History:  Diagnosis Date  . Abdominal pain, unspecified site 11/29/2013  . Acquired acanthosis nigricans   . Anemia, unspecified   . 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 Bipolar 2 disorder ([place]) 01/31/2010   Qualifier: Diagnosis of  By: Fuller Plan CMA (AAMA), Terri Skains  Follows with Dr Letta Moynahan of psychiatry   . Bipolar disorder, unspecified ([place])   . CFS (chronic fatigue syndrome)   . Degeneration of intervertebral disc, site unspecified   . Depressive disorder, not elsewhere classified   . Esophageal  reflux   . Gastroparesis 06/07/2015  . Intertriginous candidiasis 11/29/2013  . 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 Microcytic anemia 01/31/2010   Qualifier: Diagnosis of  By: Fuller Plan CMA (AAMA), [place]    . Migraine with aura, without mention of intractable migraine without mention of status migrainosus   . Myalgia and myositis, unspecified   . Nontoxic multinodular goiter   . Other and unspecified hyperlipidemia   . Other malaise and fatigue   . Other specified iron deficiency anemias   . Other thalassemia ([place])   . Parotid gland enlargement 2011   related to connective tissue syndrome  . Pelvic floor dysfunction 01/27/2013  . Preventative health care 09/12/2014  . Renal insufficiency 01/27/2013  . Sjogren's syndrome ([place])   . Small intestinal bacterial overgrowth 11/29/2013  . Systemic lupus erythematosus ([place])   . Thrombocytosis ([place]) 06/07/2016  . Type II or unspecified type diabetes mellitus without mention of complication, not stated as uncontrolled   . Unspecified constipation   . Unspecified diffuse connective tissue disease 01/27/2013   Follows at [facility] rheumatology, Dr Alanda Amass Per patient has tested 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 antibody  . Unspecified essential hypertension   . Unspecified sleep apnea   . Unspecified vitamin D deficiency     Past Surgical History:  Procedure Laterality Date  . BIOPSY THYROID     01/13/2015, 3 previous biopsy reported all benign  . LAPAROSCOPIC TOTAL HYSTERECTOMY  2004    Family History  Problem Relation Age of Onset  . Diabetes Mother   . Heart disease Mother  family history  . Allergies Mother   . Alcohol abuse Unknown        Family history of alcoholism and addiction  . Alcohol abuse Brother        drug abuse  . Heart disease Maternal Grandmother   . Heart disease Maternal Grandfather   . Obesity Sister   . Hypertension Unknown        family history  . Kidney disease  Unknown        family history  . Stroke Unknown        1st degree relative <60    Social History   Social History  . Marital status: Single    Spouse name: N/A  . Number of children: 0  . Years of education: N/A   Occupational History  . Teacher    Social History Main Topics  . Smoking status: Never Smoker  . Smokeless tobacco: Never Used  . Alcohol use No  . Drug use: No  . Sexual activity: No     Comment: lives alone, no major dietary restrictions, retired from teaching kindergarten   Other Topics Concern  . 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 alclomethasone (ACLOVATE) 0.05 % cream Apply 1 application topically 2 (two) times daily.    Marland Kitchen amLODipine (NORVASC) 5 MG tablet 1 tab po bid 180 tablet 3  . atorvastatin (LIPITOR) 20 MG tablet TAKE 1 TABLET(20 MG) BY MOUTH THREE TIMES DAILY 270 tablet 0  . betamethasone dipropionate 0.05 % lotion Apply 1 application topically 2 (two) times daily.    . cholecalciferol (VITAMIN D) 1000 UNITS tablet Take 2,000 Units by mouth daily.     . diphenhydrAMINE (BENADRYL) 25 MG tablet Take 25 mg by mouth as needed.    . empagliflozin (JARDIANCE) 10 MG TABS tablet Take 10 mg by mouth daily.    Marland Kitchen glucose blood test strip 1 each by Other route daily. One Touch Ultra     . ibuprofen (ADVIL) 200 MG tablet Take 200 mg by mouth every 6 (six) hours as needed.      Marland Kitchen ketoconazole (NIZORAL) 2 % cream APPLY EXTERNALLY TO THE AFFECTED AREA DAILY 60 g 0  . Lidocaine, Anorectal, 5 % CREA Apply topically as needed.    . Magnesium Oxide 500 MG (LAX) TABS Take by mouth daily. Take 1-4 tablets daily.    . metFORMIN (GLUCOPHAGE) 500 MG tablet Take 1,000 mg by mouth 2 (two) times daily with a meal.     . NON FORMULARY Lubricant eye ointment- sterile mineral oil 39.9% White Petroleum 57.7%    . NON FORMULARY Biotene dry  mouth oral rinse    . omeprazole (PRILOSEC) 20 MG capsule TAKE 1 CAPSULE(20 MG) BY MOUTH DAILY 90 capsule 1  . Polyethyl Glycol-Propyl Glycol (SYSTANE) 0.4-0.3 % GEL Apply to eye.    Marland Kitchen PRESCRIPTION MEDICATION prevident 5000- dry mouth toothpaste    . Probiotic Product (DIGESTIVE ADVANTAGE GUMMIES PO) Take by mouth 2 (two) times daily.    Marland Kitchen RA SUNSCREEN SPF50 EX Apply topically as needed.      . valsartan (DIOVAN) 80 MG tablet TAKE 1 TABLET(80 MG) BY MOUTH DAILY 90 tablet 0   No facility-administered medications prior to visit.     Allergies  Allergen Reactions  . Penicillins Swelling and Anaphylaxis    Swelling of  tongue.  . Sulfa Antibiotics Other (See Comments) and Rash    Other Reaction: swellin of tongue  . Sulfonamide Derivatives Swelling    REACTION: Swelling of tongue and face  . Acetazolamide   . Azathioprine Other (See Comments)    Very low TPMT in deficient range Unknown  . Ezetimibe Other (See Comments)    Unknown  . Lisinopril Other (See Comments)    Unknown  . Meloxicam Other (See Comments)    Unknown  . Pravastatin Sodium Other (See Comments)    Unknown  . Simvastatin Other (See Comments)    Unknown  . Latex Hives and Rash    Review of Systems  Constitutional: Positive for malaise/fatigue. Negative for fever.  HENT: Negative for congestion.   Eyes: Negative for blurred vision.  Respiratory: Negative for shortness of breath.   Cardiovascular: Negative for chest pain, palpitations and leg swelling.  Gastrointestinal: Negative for abdominal pain, blood in stool and nausea.  Genitourinary: Negative for dysuria and frequency.  Musculoskeletal: Negative for falls.  Skin: Negative for rash.  Neurological: Negative for dizziness, loss of consciousness and headaches.  Endo/Heme/Allergies: Negative for environmental allergies.  Psychiatric/Behavioral: Negative for depression. The patient is not nervous/anxious.        Objective:    Physical Exam    Constitutional: She is oriented to person, place, and time. She appears well-developed and well-nourished. No distress.  HENT:  Head: Normocephalic and atraumatic.  Nose: Nose normal.  Eyes: Right eye exhibits no discharge. Left eye exhibits no discharge.  Neck: Normal range of motion. Neck supple.  Cardiovascular: Normal rate and regular rhythm.   No murmur heard. Pulmonary/Chest: Effort normal and breath sounds normal.  Abdominal: Soft. Bowel sounds are normal. There is no tenderness.  Musculoskeletal: She exhibits no edema.  Neurological: She is alert and oriented to person, place, and time.  Skin: Skin is warm and dry.  Psychiatric: She has a normal mood and affect.  Nursing note and vitals reviewed.   BP 118/68 (BP Location: Left Arm, Patient Position: Sitting, Cuff Size: Normal)   Pulse 98   Temp 98.4 F (36.9 C) (Oral)   Resp 18   Wt 182 lb 3.2 oz (82.6 kg)   SpO2 97%   BMI 30.32 kg/m  Wt Readings from Last 3 Encounters:  01/14/17 182 lb 3.2 oz (82.6 kg)  09/07/16 176 lb 6.4 oz (80 kg)  06/07/16 172 lb (78 kg)   BP Readings from Last 3 Encounters:  01/14/17 118/68  09/07/16 120/68  06/07/16 120/68     Immunization History  Administered Date(s) Administered  . Hep A / Hep B 05/11/2013, 06/10/2013, 11/09/2013  . Influenza Split 05/25/2011, 05/29/2012  . Influenza Whole 05/28/2008  . Influenza,inj,Quad PF,36+ Mos 05/18/2013, 06/04/2014, 06/07/2015, 06/07/2016  . Pneumococcal Conjugate-13 07/27/2013  . Pneumococcal Polysaccharide-23 12/16/2003  . Td 12/16/2003  . Tdap 11/26/2013    Health Maintenance  Topic Date Due  . HEMOGLOBIN A1C  10/16/2016  . PNEUMOCOCCAL POLYSACCHARIDE VACCINE (2) 03/13/2017 (Originally 12/15/2008)  . INFLUENZA VACCINE  03/13/2017  . OPHTHALMOLOGY EXAM  07/31/2017  . FOOT EXAM  11/07/2017  . COLONOSCOPY  03/12/2018  . MAMMOGRAM  05/01/2018  . PAP SMEAR  03/08/2019  . TETANUS/TDAP  11/27/2023  . Hepatitis C Screening  Completed  .  HIV Screening  Completed    Lab Results  Component Value Date   WBC 7.2 09/07/2016   HGB 12.4 09/07/2016   HCT 38.5 09/07/2016   PLT 518.0 (H) 09/07/2016  GLUCOSE 99 06/07/2016   CHOL 177 06/07/2016   TRIG 149.0 06/07/2016   HDL 41.40 06/07/2016   LDLDIRECT 95 12/22/2009   LDLCALC 106 (H) 06/07/2016   ALT 15 06/07/2016   AST 13 06/07/2016   NA 141 06/07/2016   K 4.0 06/07/2016   CL 103 06/07/2016   CREATININE 0.88 06/07/2016   BUN 13 06/07/2016   CO2 27 06/07/2016   TSH 1.34 06/07/2016   HGBA1C 6.1 04/18/2016   HGBA1C 5.9 04/18/2016   MICROALBUR 4.05 (H) 03/22/2014    Lab Results  Component Value Date   TSH 1.34 06/07/2016   Lab Results  Component Value Date   WBC 7.2 09/07/2016   HGB 12.4 09/07/2016   HCT 38.5 09/07/2016   MCV 76.1 (L) 09/07/2016   PLT 518.0 (H) 09/07/2016   Lab Results  Component Value Date   NA 141 06/07/2016   K 4.0 06/07/2016   CO2 27 06/07/2016   GLUCOSE 99 06/07/2016   BUN 13 06/07/2016   CREATININE 0.88 06/07/2016   BILITOT 0.4 06/07/2016   ALKPHOS 112 06/07/2016   AST 13 06/07/2016   ALT 15 06/07/2016   PROT 8.2 06/07/2016   ALBUMIN 4.6 06/07/2016   CALCIUM 10.3 06/07/2016   ANIONGAP 13.4 12/22/2009   GFR 84.48 06/07/2016   Lab Results  Component Value Date   CHOL 177 06/07/2016   Lab Results  Component Value Date   HDL 41.40 06/07/2016   Lab Results  Component Value Date   LDLCALC 106 (H) 06/07/2016   Lab Results  Component Value Date   TRIG 149.0 06/07/2016   Lab Results  Component Value Date   CHOLHDL 4 06/07/2016   Lab Results  Component Value Date   HGBA1C 6.1 04/18/2016   HGBA1C 5.9 04/18/2016         Assessment & Plan:   Problem List Items Addressed This Visit    Sjogren's syndrome ([place]) (Chronic)    Struggling with increased dryness in eyes, skin and mucus membranes, is frustrated but trying to stay hydrated      Diabetes mellitus type 2 in nonobese ([place]) - Primary    minimize simple  carbs. Increase exercise as tolerated.       Relevant Orders   Hemoglobin A1c   Vitamin D deficiency    Encouraged daily supplements  Check level      Relevant Orders   VITAMIN D 25 Hydroxy (Vit-D Deficiency, Fractures)   Essential hypertension    Well controlled, no changes to meds. Encouraged heart healthy diet such as the DASH diet and exercise as tolerated.       Relevant Orders   Comprehensive metabolic panel   CBC   TSH   LUPUS    Not following with any subspecialists for this at this time. Previous saw pulmonology and rheumatology at [facility] but has chosen to stop seeing them for now.      Relevant Orders   Sed Rate (ESR)   Lupus erythematosus tumidus    Diagnosed at [facility]       Other Visit Diagnoses    Mixed hyperlipidemia       Relevant Orders   Lipid panel      I am having Ms. Lovena Le maintain her metFORMIN, cholecalciferol, glucose blood, RA SUNSCREEN SPF50 EX, ibuprofen, Magnesium Oxide, diphenhydrAMINE, Lidocaine (Anorectal), PRESCRIPTION MEDICATION, NON FORMULARY, NON FORMULARY, Polyethyl Glycol-Propyl Glycol, Probiotic Product (DIGESTIVE ADVANTAGE GUMMIES PO), empagliflozin, amLODipine, alclomethasone, betamethasone dipropionate, valsartan, omeprazole, atorvastatin, and ketoconazole.  No orders of  the defined types were placed in this encounter.   CMA served as Education administrator during this visit. History, Physical and Plan performed by medical provider. Documentation and orders reviewed and attested to.  [name], MD

## 2017-01-14 NOTE — Assessment & Plan Note (Addendum)
Not following with any subspecialists for this at this time. Previous saw pulmonology and rheumatology at [facility] but has chosen to stop seeing them for now.

## 2017-01-14 NOTE — Assessment & Plan Note (Signed)
minimize simple carbs. Increase exercise as tolerated.  

## 2017-02-18 ENCOUNTER — Encounter: Payer: Self-pay | Admitting: Family Medicine

## 2017-02-18 ENCOUNTER — Ambulatory Visit (INDEPENDENT_AMBULATORY_CARE_PROVIDER_SITE_OTHER): Payer: BC Managed Care – PPO | Admitting: Family Medicine

## 2017-02-18 VITALS — BP 98/66 | HR 96 | Temp 98.8°F | Resp 18 | Wt 182.6 lb

## 2017-02-18 DIAGNOSIS — E559 Vitamin D deficiency, unspecified: Secondary | ICD-10-CM

## 2017-02-18 DIAGNOSIS — Q211 Atrial septal defect, unspecified: Secondary | ICD-10-CM

## 2017-02-18 DIAGNOSIS — R000 Tachycardia, unspecified: Secondary | ICD-10-CM | POA: Diagnosis not present

## 2017-02-18 DIAGNOSIS — D75839 Thrombocytosis, unspecified: Secondary | ICD-10-CM

## 2017-02-18 DIAGNOSIS — R609 Edema, unspecified: Secondary | ICD-10-CM | POA: Diagnosis not present

## 2017-02-18 DIAGNOSIS — D473 Essential (hemorrhagic) thrombocythemia: Secondary | ICD-10-CM

## 2017-02-18 DIAGNOSIS — E1159 Type 2 diabetes mellitus with other circulatory complications: Secondary | ICD-10-CM | POA: Diagnosis not present

## 2017-02-18 DIAGNOSIS — I10 Essential (primary) hypertension: Secondary | ICD-10-CM

## 2017-02-18 NOTE — Patient Instructions (Addendum)
Elevate feet twice daily for 15 minutes  Edema Edema is an abnormal buildup of fluids in your bodytissues. Edema is somewhatdependent on gravity to pull the fluid to the lowest place in your body. That makes the condition more common in the legs and thighs (lower extremities). Painless swelling of the feet and ankles is common and becomes more likely as you get older. It is also common in looser tissues, like around your eyes. When the affected area is squeezed, the fluid may move out of that spot and leave a dent for a few moments. This dent is called pitting. What are the causes? There are many possible causes of edema. Eating too much salt and being on your feet or sitting for a long time can cause edema in your legs and ankles. Hot weather may make edema worse. Common medical causes of edema include:  Heart failure.  Liver disease.  Kidney disease.  Weak blood vessels in your legs.  Cancer.  An injury.  Pregnancy.  Some medications.  Obesity.  What are the signs or symptoms? Edema is usually painless.Your skin may look swollen or shiny. How is this diagnosed? Your health care provider may be able to diagnose edema by asking about your medical history and doing a physical exam. You may need to have tests such as X-rays, an electrocardiogram, or blood tests to check for medical conditions that may cause edema. How is this treated? Edema treatment depends on the cause. If you have heart, liver, or kidney disease, you need the treatment appropriate for these conditions. General treatment may include:  Elevation of the affected body part above the level of your heart.  Compression of the affected body part. Pressure from elastic bandages or support stockings squeezes the tissues and forces fluid back into the blood vessels. This keeps fluid from entering the tissues.  Restriction of fluid and salt intake.  Use of a water pill (diuretic). These medications are appropriate  only for some types of edema. They pull fluid out of your body and make you urinate more often. This gets rid of fluid and reduces swelling, but diuretics can have side effects. Only use diuretics as directed by your health care provider.  Follow these instructions at home:  Keep the affected body part above the level of your heart when you are lying down.  Do not sit still or stand for prolonged periods.  Do not put anything directly under your knees when lying down.  Do not wear constricting clothing or garters on your upper legs.  Exercise your legs to work the fluid back into your blood vessels. This may help the swelling go down.  Wear elastic bandages or support stockings to reduce ankle swelling as directed by your health care provider.  Eat a low-salt diet to reduce fluid if your health care provider recommends it.  Only take medicines as directed by your health care provider. Contact a health care provider if:  Your edema is not responding to treatment.  You have heart, liver, or kidney disease and notice symptoms of edema.  You have edema in your legs that does not improve after elevating them.  You have sudden and unexplained weight gain. Get help right away if:  You develop shortness of breath or chest pain.  You cannot breathe when you lie down.  You develop pain, redness, or warmth in the swollen areas.  You have heart, liver, or kidney disease and suddenly get edema.  You have a fever and  your symptoms suddenly get worse. This information is not intended to replace advice given to you by your health care provider. Make sure you discuss any questions you have with your health care provider. Document Released: 07/30/2005 Document Revised: 01/05/2016 Document Reviewed: 05/22/2013 Elsevier Interactive Patient Education  2017 [employer].

## 2017-02-18 NOTE — Progress Notes (Signed)
Subjective:  I acted as a Education administrator for Dr. Charlett Blake. Lori Zamora, [place]  Patient ID: Lori Zamora, female    DOB: 24-Jan-1956, 61 y.o.   MRN: [id number]  No chief complaint on file.   HPI  Patient is in today for an acute visit she c/o both feet swollen on going for about a month. Has a long history of swelling around ankles but over The past month she has noted the swelling has gone up her legs. The left is a little bit worse than right. She denies any trauma or recent illness. She had had a higher level of sodium in usual prior to the recent flare. No recent febrile illness or hospitalization. Denies CP/palp/SOB/HA/congestion/fevers/GI or GU c/o. Taking meds as prescribed  Patient Care Team: Mosie Lukes, MD as PCP - General (Family Medicine) Delrae Rend, MD as Consulting Physician (Endocrinology) Victorino Dike, DDS as Consulting Physician (Dentistry) Luberta Mutter, MD as Consulting Physician (Ophthalmology) Charlean Sanfilippo, MD as Referring Physician (Gastroenterology) Newell Coral., MD as Referring Physician (Gastroenterology)   Past Medical History:  Diagnosis Date  . Abdominal pain, unspecified site 11/29/2013  . Acquired acanthosis nigricans   . Anemia, unspecified   . 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 Bipolar 2 disorder ([place]) 01/31/2010   Qualifier: Diagnosis of  By: Fuller Plan CMA (AAMA), Terri Skains  Follows with Dr Letta Moynahan of psychiatry   . Bipolar disorder, unspecified ([place])   . CFS (chronic fatigue syndrome)   . Degeneration of intervertebral disc, site unspecified   . Depressive disorder, not elsewhere classified   . Esophageal reflux   . Gastroparesis 06/07/2015  . Intertriginous candidiasis 11/29/2013  . 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 Microcytic anemia 01/31/2010   Qualifier: Diagnosis of  By: Fuller Plan CMA (AAMA), [place]    . Migraine with aura, without mention of intractable migraine without mention of status migrainosus   . Myalgia  and myositis, unspecified   . Nontoxic multinodular goiter   . Other and unspecified hyperlipidemia   . Other malaise and fatigue   . Other specified iron deficiency anemias   . Other thalassemia ([place])   . Parotid gland enlargement 2011   related to connective tissue syndrome  . Pelvic floor dysfunction 01/27/2013  . Peripheral edema 02/19/2017  . Preventative health care 09/12/2014  . Renal insufficiency 01/27/2013  . Sjogren's syndrome ([place])   . Small intestinal bacterial overgrowth 11/29/2013  . Systemic lupus erythematosus ([place])   . Thrombocytosis ([place]) 06/07/2016  . Type II or unspecified type diabetes mellitus without mention of complication, not stated as uncontrolled   . Unspecified constipation   . Unspecified diffuse connective tissue disease 01/27/2013   Follows at [facility] rheumatology, Dr Alanda Amass Per patient has tested 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 antibody  . Unspecified essential hypertension   . Unspecified sleep apnea   . Unspecified vitamin D deficiency     Past Surgical History:  Procedure Laterality Date  . BIOPSY THYROID     01/13/2015, 3 previous biopsy reported all benign  . LAPAROSCOPIC TOTAL HYSTERECTOMY  2004    Family History  Problem Relation Age of Onset  . Diabetes Mother   . Heart disease Mother        family history  . Allergies Mother   . Alcohol abuse Unknown        Family history of alcoholism and addiction  . Alcohol abuse Brother        drug abuse  .  Heart disease Maternal Grandmother   . Heart disease Maternal Grandfather   . Obesity Sister   . Hypertension Unknown        family history  . Kidney disease Unknown        family history  . Stroke Unknown        1st degree relative <60    Social History   Social History  . Marital status: Single    Spouse name: N/A  . Number of children: 0  . Years of education: N/A   Occupational History  . Teacher    Social History Main Topics    . Smoking status: Never Smoker  . Smokeless tobacco: Never Used  . Alcohol use No  . Drug use: No  . Sexual activity: No     Comment: lives alone, no major dietary restrictions, retired from teaching kindergarten   Other Topics Concern  . 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 alclomethasone (ACLOVATE) 0.05 % cream Apply 1 application topically 2 (two) times daily.    Marland Kitchen amLODipine (NORVASC) 5 MG tablet 1 tab po bid 180 tablet 3  . atorvastatin (LIPITOR) 20 MG tablet TAKE 1 TABLET(20 MG) BY MOUTH THREE TIMES DAILY 270 tablet 0  . betamethasone dipropionate 0.05 % lotion Apply 1 application topically 2 (two) times daily.    . cholecalciferol (VITAMIN D) 1000 UNITS tablet Take 2,000 Units by mouth daily.     . diphenhydrAMINE (BENADRYL) 25 MG tablet Take 25 mg by mouth as needed.    . empagliflozin (JARDIANCE) 10 MG TABS tablet Take 10 mg by mouth daily.    Marland Kitchen glucose blood test strip 1 each by Other route daily. One Touch Ultra     . ibuprofen (ADVIL) 200 MG tablet Take 200 mg by mouth every 6 (six) hours as needed.      Marland Kitchen ketoconazole (NIZORAL) 2 % cream APPLY EXTERNALLY TO THE AFFECTED AREA DAILY 60 g 0  . Lidocaine, Anorectal, 5 % CREA Apply topically as needed.    . Magnesium Oxide 500 MG (LAX) TABS Take by mouth daily. Take 1-4 tablets daily.    . metFORMIN (GLUCOPHAGE) 500 MG tablet Take 1,000 mg by mouth 2 (two) times daily with a meal.     . NON FORMULARY Lubricant eye ointment- sterile mineral oil 39.9% White Petroleum 57.7%    . NON FORMULARY Biotene dry mouth oral rinse    . omeprazole (PRILOSEC) 20 MG capsule TAKE 1 CAPSULE(20 MG) BY MOUTH DAILY 90 capsule 1  . Polyethyl Glycol-Propyl Glycol (SYSTANE) 0.4-0.3 % GEL Apply to eye.    Marland Kitchen PRESCRIPTION MEDICATION prevident 5000- dry mouth toothpaste    . Probiotic Product (DIGESTIVE ADVANTAGE  GUMMIES PO) Take by mouth 2 (two) times daily.    Marland Kitchen RA SUNSCREEN SPF50 EX Apply topically as needed.      . valsartan (DIOVAN) 80 MG tablet TAKE 1 TABLET(80 MG) BY MOUTH DAILY 90 tablet 0   No facility-administered medications prior to visit.     Allergies  Allergen Reactions  . Penicillins Swelling and Anaphylaxis    Swelling of tongue.  . Sulfa Antibiotics Other (See Comments) and Rash    Other Reaction: swellin of tongue  . Sulfonamide Derivatives Swelling    REACTION: Swelling of tongue and face  . Acetazolamide   . Azathioprine Other (  See Comments)    Very low TPMT in deficient range Unknown  . Ezetimibe Other (See Comments)    Unknown  . Lisinopril Other (See Comments)    Unknown  . Meloxicam Other (See Comments)    Unknown  . Pravastatin Sodium Other (See Comments)    Unknown  . Simvastatin Other (See Comments)    Unknown  . Latex Hives and Rash    Review of Systems  Constitutional: Positive for malaise/fatigue. Negative for fever.  HENT: Positive for congestion.   Eyes: Negative for blurred vision.  Respiratory: Positive for cough. Negative for shortness of breath.   Cardiovascular: Positive for leg swelling. Negative for chest pain and palpitations.  Gastrointestinal: Negative for abdominal pain, blood in stool and nausea.  Genitourinary: Negative for dysuria and frequency.  Musculoskeletal: Positive for myalgias. Negative for falls.  Skin: Negative for rash.  Neurological: Negative for dizziness, loss of consciousness and headaches.  Endo/Heme/Allergies: Negative for environmental allergies.  Psychiatric/Behavioral: Negative for depression. The patient is not nervous/anxious.        Objective:    Physical Exam  Constitutional: She is oriented to person, place, and time. She appears well-developed and well-nourished. No distress.  HENT:  Head: Normocephalic and atraumatic.  Nose: Nose normal.  Eyes: Right eye exhibits no discharge. Left eye exhibits no  discharge.  Neck: Normal range of motion. Neck supple.  Cardiovascular: Normal rate and regular rhythm.   No murmur heard. Pulmonary/Chest: Effort normal and breath sounds normal.  Abdominal: Soft. Bowel sounds are normal. There is no tenderness.  Musculoskeletal: She exhibits edema.  No pitting. L>R pedal edema. To mid calf  Neurological: She is alert and oriented to person, place, and time.  Skin: Skin is warm and dry.  Psychiatric: She has a normal mood and affect.  Nursing note and vitals reviewed.   BP 98/66 (BP Location: Left Arm, Patient Position: Sitting, Cuff Size: Normal)   Pulse 96   Temp 98.8 F (37.1 C) (Oral)   Resp 18   Wt 182 lb 9.6 oz (82.8 kg)   SpO2 97%   BMI 30.39 kg/m  Wt Readings from Last 3 Encounters:  02/18/17 182 lb 9.6 oz (82.8 kg)  01/14/17 182 lb 3.2 oz (82.6 kg)  09/07/16 176 lb 6.4 oz (80 kg)   BP Readings from Last 3 Encounters:  02/18/17 98/66  01/14/17 118/68  09/07/16 120/68     Immunization History  Administered Date(s) Administered  . Hep A / Hep B 05/11/2013, 06/10/2013, 11/09/2013  . Influenza Split 05/25/2011, 05/29/2012  . Influenza Whole 05/28/2008  . Influenza,inj,Quad PF,36+ Mos 05/18/2013, 06/04/2014, 06/07/2015, 06/07/2016  . Pneumococcal Conjugate-13 07/27/2013  . Pneumococcal Polysaccharide-23 12/16/2003  . Td 12/16/2003  . Tdap 11/26/2013    Health Maintenance  Topic Date Due  . PNEUMOCOCCAL POLYSACCHARIDE VACCINE (2) 03/13/2017 (Originally 12/15/2008)  . INFLUENZA VACCINE  03/13/2017  . HEMOGLOBIN A1C  07/16/2017  . OPHTHALMOLOGY EXAM  10/17/2017  . FOOT EXAM  11/07/2017  . COLONOSCOPY  03/12/2018  . MAMMOGRAM  05/01/2018  . PAP SMEAR  03/08/2019  . TETANUS/TDAP  11/27/2023  . Hepatitis C Screening  Completed  . HIV Screening  Completed    Lab Results  Component Value Date   WBC 7.5 01/14/2017   HGB 12.0 01/14/2017   HCT 38.8 01/14/2017   PLT 524.0 (H) 01/14/2017   GLUCOSE 99 01/14/2017   CHOL 146  01/14/2017   TRIG 127.0 01/14/2017   HDL 35.90 (L) 01/14/2017   LDLDIRECT  95 12/22/2009   LDLCALC 85 01/14/2017   ALT 11 01/14/2017   AST 13 01/14/2017   NA 140 01/14/2017   K 4.4 01/14/2017   CL 104 01/14/2017   CREATININE 0.88 01/14/2017   BUN 15 01/14/2017   CO2 28 01/14/2017   TSH 1.32 01/14/2017   HGBA1C 7.2 (H) 01/14/2017   MICROALBUR 4.05 (H) 03/22/2014    Lab Results  Component Value Date   TSH 1.32 01/14/2017   Lab Results  Component Value Date   WBC 7.5 01/14/2017   HGB 12.0 01/14/2017   HCT 38.8 01/14/2017   MCV 77.7 (L) 01/14/2017   PLT 524.0 (H) 01/14/2017   Lab Results  Component Value Date   NA 140 01/14/2017   K 4.4 01/14/2017   CO2 28 01/14/2017   GLUCOSE 99 01/14/2017   BUN 15 01/14/2017   CREATININE 0.88 01/14/2017   BILITOT 0.3 01/14/2017   ALKPHOS 102 01/14/2017   AST 13 01/14/2017   ALT 11 01/14/2017   PROT 7.6 01/14/2017   ALBUMIN 4.4 01/14/2017   CALCIUM 10.2 01/14/2017   ANIONGAP 13.4 12/22/2009   GFR 84.31 01/14/2017   Lab Results  Component Value Date   CHOL 146 01/14/2017   Lab Results  Component Value Date   HDL 35.90 (L) 01/14/2017   Lab Results  Component Value Date   LDLCALC 85 01/14/2017   Lab Results  Component Value Date   TRIG 127.0 01/14/2017   Lab Results  Component Value Date   CHOLHDL 4 01/14/2017   Lab Results  Component Value Date   HGBA1C 7.2 (H) 01/14/2017         Assessment & Plan:   Problem List Items Addressed This Visit    Vitamin D deficiency    Lab wnl, continue supplements      Essential hypertension    Well controlled, no changes to meds. Encouraged heart healthy diet such as the DASH diet and exercise as tolerated.       Thrombocytosis ([place])    Has recurred slightly will continue to monitor      Peripheral edema - Primary    L>R, no trauma, no pitting, likely related some to her connective tissue disease but will check labs and an echo as well as set up cardiology  consultation after discussion with patient       Relevant Orders   TSH   Sed Rate (ESR)   Comprehensive metabolic panel   CBC w/Diff   ECHO TEE   Ambulatory referral to Cardiology   ASD (atrial septal defect)    Positive bubble study in local system EMR reviewed. Proceed with repeat study and refer to cardiology for further consideration. Also noted to have some intermittent tachycardia so lab work is also ordered      Relevant Orders   ECHO TEE   Ambulatory referral to Cardiology   Diabetes mellitus ([place])    hgba1c acceptable, minimize simple carbs. Increase exercise as tolerated. Continue current meds      Relevant Orders   TSH   Sed Rate (ESR)   Comprehensive metabolic panel   CBC w/Diff   ECHO TEE    Other Visit Diagnoses    Tachycardia       Relevant Orders   TSH   Sed Rate (ESR)   Comprehensive metabolic panel   CBC w/Diff   ECHO TEE   Ambulatory referral to Cardiology      I am having Ms. Lori Zamora maintain her metFORMIN, cholecalciferol, glucose  blood, RA SUNSCREEN SPF50 EX, ibuprofen, Magnesium Oxide, diphenhydrAMINE, Lidocaine (Anorectal), PRESCRIPTION MEDICATION, NON FORMULARY, NON FORMULARY, Polyethyl Glycol-Propyl Glycol, Probiotic Product (DIGESTIVE ADVANTAGE GUMMIES PO), empagliflozin, amLODipine, alclomethasone, betamethasone dipropionate, valsartan, omeprazole, atorvastatin, ketoconazole, and OT ULTRA/FASTTK CNTRL SOLN.  Meds ordered this encounter  Medications  . Blood Glucose Calibration (OT ULTRA/FASTTK CNTRL SOLN) SOLN    Sig: Check Sugars prn    Refill:  1    CMA served as scribe during this visit. History, Physical and Plan performed by medical provider. Documentation and orders reviewed and attested to.  [name], MD

## 2017-02-19 ENCOUNTER — Encounter: Payer: Self-pay | Admitting: Family Medicine

## 2017-02-19 ENCOUNTER — Telehealth: Payer: Self-pay | Admitting: Family Medicine

## 2017-02-19 DIAGNOSIS — Q211 Atrial septal defect, unspecified: Secondary | ICD-10-CM | POA: Insufficient documentation

## 2017-02-19 DIAGNOSIS — E119 Type 2 diabetes mellitus without complications: Secondary | ICD-10-CM | POA: Insufficient documentation

## 2017-02-19 DIAGNOSIS — R600 Localized edema: Secondary | ICD-10-CM

## 2017-02-19 DIAGNOSIS — R609 Edema, unspecified: Secondary | ICD-10-CM

## 2017-02-19 HISTORY — DX: Localized edema: R60.0

## 2017-02-19 HISTORY — DX: Edema, unspecified: R60.9

## 2017-02-19 LAB — CBC WITH DIFFERENTIAL/PLATELET
BASOS ABS: 0.1 10*3/uL (ref 0.0–0.1)
BASOS PCT: 0.6 % (ref 0.0–3.0)
EOS ABS: 0.1 10*3/uL (ref 0.0–0.7)
Eosinophils Relative: 0.9 % (ref 0.0–5.0)
HEMATOCRIT: 36.9 % (ref 36.0–46.0)
Hemoglobin: 11.6 g/dL — ABNORMAL LOW (ref 12.0–15.0)
LYMPHS PCT: 24.5 % (ref 12.0–46.0)
Lymphs Abs: 2.1 10*3/uL (ref 0.7–4.0)
MCHC: 31.3 g/dL (ref 30.0–36.0)
MCV: 76.8 fl — ABNORMAL LOW (ref 78.0–100.0)
MONO ABS: 0.5 10*3/uL (ref 0.1–1.0)
Monocytes Relative: 6 % (ref 3.0–12.0)
Neutro Abs: 5.9 10*3/uL (ref 1.4–7.7)
Neutrophils Relative %: 68 % (ref 43.0–77.0)
Platelets: 479 10*3/uL — ABNORMAL HIGH (ref 150.0–400.0)
RBC: 4.8 Mil/uL (ref 3.87–5.11)
RDW: 17.9 % — AB (ref 11.5–15.5)
WBC: 8.7 10*3/uL (ref 4.0–10.5)

## 2017-02-19 LAB — COMPREHENSIVE METABOLIC PANEL
ALBUMIN: 4.2 g/dL (ref 3.5–5.2)
ALK PHOS: 105 U/L (ref 39–117)
ALT: 13 U/L (ref 0–35)
AST: 10 U/L (ref 0–37)
BILIRUBIN TOTAL: 0.3 mg/dL (ref 0.2–1.2)
BUN: 14 mg/dL (ref 6–23)
CALCIUM: 9.8 mg/dL (ref 8.4–10.5)
CO2: 26 mEq/L (ref 19–32)
CREATININE: 0.89 mg/dL (ref 0.40–1.20)
Chloride: 105 mEq/L (ref 96–112)
GFR: 83.19 mL/min (ref >60.00)
Glucose, Bld: 99 mg/dL (ref 70–99)
Potassium: 4.5 mEq/L (ref 3.5–5.1)
SODIUM: 142 meq/L (ref 135–145)
TOTAL PROTEIN: 7.3 g/dL (ref 6.0–8.3)

## 2017-02-19 LAB — SEDIMENTATION RATE: Sed Rate: 38 mm/hr — ABNORMAL HIGH (ref 0–30)

## 2017-02-19 LAB — TSH: TSH: 1.61 u[IU]/mL (ref 0.35–4.50)

## 2017-02-19 NOTE — Telephone Encounter (Signed)
Patient informed of PCP instructions. She had heard already from cardiology

## 2017-02-19 NOTE — Assessment & Plan Note (Signed)
hgba1c acceptable, minimize simple carbs. Increase exercise as tolerated. Continue current meds 

## 2017-02-19 NOTE — Assessment & Plan Note (Signed)
Has recurred slightly will continue to monitor

## 2017-02-19 NOTE — Assessment & Plan Note (Signed)
Positive bubble study in local system EMR reviewed. Proceed with repeat study and refer to cardiology for further consideration. Also noted to have some intermittent tachycardia so lab work is also ordered

## 2017-02-19 NOTE — Assessment & Plan Note (Signed)
Well controlled, no changes to meds. Encouraged heart healthy diet such as the DASH diet and exercise as tolerated.  °

## 2017-02-19 NOTE — Assessment & Plan Note (Signed)
Lab wnl, continue supplements

## 2017-02-19 NOTE — Assessment & Plan Note (Signed)
L>R, no trauma, no pitting, likely related some to her connective tissue disease but will check labs and an echo as well as set up cardiology consultation after discussion with patient

## 2017-02-19 NOTE — Telephone Encounter (Signed)
Per Imaging, the ECHO TEE must be order by a cardiologist, please advise

## 2017-02-19 NOTE — Telephone Encounter (Signed)
I was afraid of that. I have referred her to cardiology so they can order when they see her I was just trying to expedite since she had a positive study at [facility] a few years ago. Please let her know

## 2017-02-20 ENCOUNTER — Encounter: Payer: Self-pay | Admitting: Cardiology

## 2017-02-20 ENCOUNTER — Ambulatory Visit (INDEPENDENT_AMBULATORY_CARE_PROVIDER_SITE_OTHER): Payer: BC Managed Care – PPO | Admitting: Cardiology

## 2017-02-20 VITALS — BP 117/75 | HR 82 | Ht 65.5 in | Wt 182.8 lb

## 2017-02-20 DIAGNOSIS — R000 Tachycardia, unspecified: Secondary | ICD-10-CM | POA: Diagnosis not present

## 2017-02-20 DIAGNOSIS — I10 Essential (primary) hypertension: Secondary | ICD-10-CM | POA: Diagnosis not present

## 2017-02-20 DIAGNOSIS — E782 Mixed hyperlipidemia: Secondary | ICD-10-CM | POA: Diagnosis not present

## 2017-02-20 DIAGNOSIS — R600 Localized edema: Secondary | ICD-10-CM | POA: Diagnosis not present

## 2017-02-20 NOTE — Patient Instructions (Signed)
Medication Instructions:  Your physician recommends that you continue on your current medications as directed. Please refer to the Current Medication list given to you today.  * If you need a refill on your cardiac medications before your next appointment, please call your pharmacy.   Labwork: None ordered  Testing/Procedures: None ordered  Follow-Up: No follow up is needed at this time with Dr. Camnitz.  He will see you on an as needed basis.   Thank you for choosing [facility]!!   Emery Dupuy, RN [phone]     

## 2017-02-21 NOTE — Progress Notes (Signed)
Electrophysiology Office Note   Date:  02/21/2017   ID:  Lori, Zamora 05-Oct-1955, MRN [id number]  PCP:  Mosie Lukes, MD   Primary Electrophysiologist:  [name], MD    Chief Complaint  Patient presents with  . Follow-up    Tachycardia     History of Present Illness: Lori Zamora is a 61 y.o. female who presents today for electrophysiology evaluation.   She presents today as she feels that her legs have been swelling. They are worse at the end of the day and improved with elevation. She says that her toes are more swollen as well. She has tried compression stockings. The left side is worse than the right. She has not had any recent illnesses. She has had no further episodes of palpitations.  She has no SOB, PND or orthopnea. She can exert herself without issue.   Today, denies symptoms of palpitations, chest pain, shortness of breath, orthopnea, PND, claudication, dizziness, presyncope, syncope, bleeding, or neurologic sequela. The patient is tolerating medications without difficulties and is otherwise without complaint today.    Past Medical History:  Diagnosis Date  . Abdominal pain, unspecified site 11/29/2013  . Acquired acanthosis nigricans   . Anemia, unspecified   . 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 Bipolar 2 disorder ([place]) 01/31/2010   Qualifier: Diagnosis of  By: Fuller Plan CMA (AAMA), Terri Skains  Follows with Dr Letta Moynahan of psychiatry   . Bipolar disorder, unspecified ([place])   . CFS (chronic fatigue syndrome)   . Degeneration of intervertebral disc, site unspecified   . Depressive disorder, not elsewhere classified   . Esophageal reflux   . Gastroparesis 06/07/2015  . Intertriginous candidiasis 11/29/2013  . 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 Microcytic anemia 01/31/2010   Qualifier: Diagnosis of  By: Fuller Plan CMA (AAMA), [place]    . Migraine with aura, without mention of intractable migraine without mention of status  migrainosus   . Myalgia and myositis, unspecified   . Nontoxic multinodular goiter   . Other and unspecified hyperlipidemia   . Other malaise and fatigue   . Other specified iron deficiency anemias   . Other thalassemia ([place])   . Parotid gland enlargement 2011   related to connective tissue syndrome  . Pelvic floor dysfunction 01/27/2013  . Peripheral edema 02/19/2017  . Preventative health care 09/12/2014  . Renal insufficiency 01/27/2013  . Sjogren's syndrome ([place])   . Small intestinal bacterial overgrowth 11/29/2013  . Systemic lupus erythematosus ([place])   . Thrombocytosis ([place]) 06/07/2016  . Type II or unspecified type diabetes mellitus without mention of complication, not stated as uncontrolled   . Unspecified constipation   . 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 Unspecified essential hypertension   . Unspecified sleep apnea   . Unspecified vitamin D deficiency    Past Surgical History:  Procedure Laterality Date  . BIOPSY THYROID     01/13/2015, 3 previous biopsy reported all benign  . LAPAROSCOPIC TOTAL HYSTERECTOMY  2004     Current Outpatient Prescriptions  Medication Sig Dispense Refill  . alclomethasone (ACLOVATE) 0.05 % cream Apply 1 application topically 2 (two) times daily.    Marland Kitchen amLODipine (NORVASC) 5 MG tablet 1 tab po bid 180 tablet 3  . atorvastatin (LIPITOR) 20 MG tablet TAKE 1 TABLET(20 MG) BY MOUTH THREE TIMES DAILY  270 tablet 0  . betamethasone dipropionate 0.05 % lotion Apply 1 application topically 2 (two) times daily.    . Blood Glucose Calibration (OT ULTRA/FASTTK CNTRL SOLN) SOLN Check Sugars prn  1  . cholecalciferol (VITAMIN D) 1000 UNITS tablet Take 2,000 Units by mouth daily.     . diphenhydrAMINE (BENADRYL) 25 MG tablet Take 25 mg by mouth as needed.    . empagliflozin  (JARDIANCE) 10 MG TABS tablet Take 10 mg by mouth daily.    Marland Kitchen glucose blood test strip 1 each by Other route daily. One Touch Ultra     . ibuprofen (ADVIL) 200 MG tablet Take 200 mg by mouth every 6 (six) hours as needed.      Marland Kitchen ketoconazole (NIZORAL) 2 % cream APPLY EXTERNALLY TO THE AFFECTED AREA DAILY 60 g 0  . Lidocaine, Anorectal, 5 % CREA Apply topically as needed.    . Magnesium Oxide 500 MG (LAX) TABS Take by mouth daily. Take 1-4 tablets daily.    . metFORMIN (GLUCOPHAGE) 500 MG tablet Take 1,000 mg by mouth 2 (two) times daily with a meal.     . NON FORMULARY Lubricant eye ointment- sterile mineral oil 39.9% White Petroleum 57.7%    . NON FORMULARY Biotene dry mouth oral rinse    . omeprazole (PRILOSEC) 20 MG capsule TAKE 1 CAPSULE(20 MG) BY MOUTH DAILY 90 capsule 1  . Polyethyl Glycol-Propyl Glycol (SYSTANE) 0.4-0.3 % GEL Apply to eye.    Marland Kitchen PRESCRIPTION MEDICATION prevident 5000- dry mouth toothpaste    . Probiotic Product (DIGESTIVE ADVANTAGE GUMMIES PO) Take by mouth 2 (two) times daily.    Marland Kitchen RA SUNSCREEN SPF50 EX Apply topically as needed.      . valsartan (DIOVAN) 80 MG tablet TAKE 1 TABLET(80 MG) BY MOUTH DAILY 90 tablet 0   No current facility-administered medications for this visit.     Allergies:   Penicillins; Sulfa antibiotics; Sulfonamide derivatives; Acetazolamide; Azathioprine; Ezetimibe; Lisinopril; Meloxicam; Pravastatin sodium; Simvastatin; and Latex   Social History:  The patient  reports that she has never smoked. She has never used smokeless tobacco. She reports that she does not drink alcohol or use drugs.   Family History:  The patient's family history includes Alcohol abuse in her brother and unknown relative; Allergies in her mother; Diabetes in her mother; Heart disease in her maternal grandfather, maternal grandmother, and mother; Hypertension in her unknown relative; Kidney disease in her unknown relative; Obesity in her sister; Stroke in her unknown  relative.    ROS:  Please see the history of present illness.   Otherwise, review of systems is positive for edema in feet.   All other systems are reviewed and negative.     PHYSICAL EXAM: VS:  BP 117/75   Pulse 82   Ht 5' 5.5" (1.664 m)   Wt 182 lb 12.8 oz (82.9 kg)   BMI 29.96 kg/m  , BMI Body mass index is 29.96 kg/m. GEN: Well nourished, well developed, in no acute distress  HEENT: normal  Neck: no JVD, carotid bruits, or masses Cardiac: RRR; no murmurs, rubs, or gallops,no edema  Respiratory:  clear to auscultation bilaterally, normal work of breathing GI: soft, nontender, nondistended, + BS MS: no deformity or atrophy  Skin: warm and dry Neuro:  Strength and sensation are intact Psych: euthymic mood, full affect  EKG:  EKG is ordered today. Personal review of the ekg ordered shows sinus rhythm  No data found.  Recent Labs: 02/18/2017: ALT  13; BUN 14; Creatinine, Ser 0.89; Hemoglobin 11.6; Platelets 479.0; Potassium 4.5; Sodium 142; TSH 1.61    Lipid Panel     Component Value Date/Time   CHOL 146 01/14/2017 0847   TRIG 127.0 01/14/2017 0847   HDL 35.90 (L) 01/14/2017 0847   CHOLHDL 4 01/14/2017 0847   VLDL 25.4 01/14/2017 0847   LDLCALC 85 01/14/2017 0847   LDLDIRECT 95 12/22/2009     Wt Readings from Last 3 Encounters:  02/20/17 182 lb 12.8 oz (82.9 kg)  02/18/17 182 lb 9.6 oz (82.8 kg)  01/14/17 182 lb 3.2 oz (82.6 kg)      Other studies Reviewed: Additional studies/ records that were reviewed today include: TTE 10/05/15  Review of the above records today demonstrates:  - Left ventricle: The cavity size was normal. Wall thickness was   normal. Systolic function was normal. The estimated ejection   fraction was in the range of 55% to 60%. Wall motion was normal;   there were no regional wall motion abnormalities. Doppler   parameters are consistent with abnormal left ventricular   relaxation (grade 1 diastolic dysfunction). - Aortic valve:  Trileaflet; mildly thickened, mildly calcified   leaflets. - Aortic root: The aortic root was normal in size. - Mitral valve: There was mild regurgitation. - Atrial septum: The interatrial septum was hypermobile.  Personal review of Telemetry 03/30/16 The initial Baseline transmission shows SINUS RHYTHM The high heart rate seen for the monitored period was 150 BPM. The low heart rate seen for the monitored period was 90 BPM. 19 manually-triggered recording(s) posted with symptoms including Rapid HR/Palp/Flutter associated with sinus rhythm and sinus tachycardia  ASSESSMENT AND PLAN:  1.  Hypertension: well controlled today. No changes  2. Hyperlipidemia: continue atorvastatin   3. Tachycardia: no further episodes of palpitations. No changes at this time to her medications  4. Lower extremity edema: at this point, it is unclear as to the cause of her edema. Her echo, performed 09/2015, shows a normal EF with only grade 1 diastolic dysfunction. She has no PND, orthopnea or dyspnea with exertion. At this time, it is unlikely that her foot edema is cardiac in nature.    Current medicines are reviewed at length with the patient today.   The patient does not have concerns regarding her medicines.  The following changes were made today:  none  Labs/ tests ordered today include:  Orders Placed This Encounter  Procedures  . EKG 12-Lead     Disposition:   FU with Ved Martos PRN  Signed, Shirlena Brinegar Meredith Leeds, MD  02/21/2017 9:44 AM     [address] [place] [address] [phone] (office) [phone] (fax)

## 2017-03-02 ENCOUNTER — Other Ambulatory Visit: Payer: Self-pay | Admitting: Family Medicine

## 2017-03-08 ENCOUNTER — Encounter: Payer: Self-pay | Admitting: Family Medicine

## 2017-03-11 ENCOUNTER — Other Ambulatory Visit: Payer: Self-pay | Admitting: Family Medicine

## 2017-03-11 MED ORDER — LOSARTAN POTASSIUM 25 MG PO TABS
25.0000 mg | ORAL_TABLET | Freq: Every day | ORAL | 3 refills | Status: DC
Start: 2017-03-11 — End: 2017-05-20

## 2017-03-15 ENCOUNTER — Ambulatory Visit (INDEPENDENT_AMBULATORY_CARE_PROVIDER_SITE_OTHER): Payer: BC Managed Care – PPO

## 2017-03-15 ENCOUNTER — Encounter: Payer: Self-pay | Admitting: Podiatry

## 2017-03-15 ENCOUNTER — Other Ambulatory Visit: Payer: Self-pay | Admitting: Podiatry

## 2017-03-15 ENCOUNTER — Ambulatory Visit (INDEPENDENT_AMBULATORY_CARE_PROVIDER_SITE_OTHER): Payer: BC Managed Care – PPO | Admitting: Podiatry

## 2017-03-15 DIAGNOSIS — M79675 Pain in left toe(s): Secondary | ICD-10-CM | POA: Diagnosis not present

## 2017-03-15 DIAGNOSIS — M79674 Pain in right toe(s): Secondary | ICD-10-CM | POA: Diagnosis not present

## 2017-03-15 DIAGNOSIS — M779 Enthesopathy, unspecified: Secondary | ICD-10-CM | POA: Diagnosis not present

## 2017-03-15 NOTE — Progress Notes (Signed)
Subjective:    Patient ID: Lori Zamora, female   DOB: 61 y.o.   MRN: [id number]   HPI patient's concerned because she feels like her toes are swollen and also she's had previous procedure fifth digit that she's concerned may be developing a corn    Review of Systems  All other systems reviewed and are negative.       Objective:  Physical Exam  Cardiovascular: Intact distal pulses.   Pulmonary/Chest: Breath sounds normal.  Musculoskeletal: Normal range of motion.  Neurological: She is alert.  Skin: Skin is warm.  Nursing note and vitals reviewed.  neurovascular status intact muscle strength is adequate range of motion within normal limits with patient having neurovascular distal examination and found not to have pathology. There may be mild swelling in the hallux digits bilateral but I did not note anything substantial and small corn formation in the right fifth digit     Assessment:     Possibility for edema of the digits with small keratotic lesion fifth digit right but nothing of a significant nature    Plan:    H&P reviewed and x-rays reviewed. Today I advised on wider-type shoes and I did advise on reduced activity and patient will be seen back if symptoms were to get worse  X-rays indicating normal type alignment with no indications of arthritis or other bone pathology with history of proximal phalanx head resection right

## 2017-03-30 ENCOUNTER — Other Ambulatory Visit: Payer: Self-pay | Admitting: Family Medicine

## 2017-04-01 NOTE — Telephone Encounter (Signed)
Faxed 90d Amlodipine till OV/thx dmf

## 2017-04-24 LAB — HEMOGLOBIN A1C: HEMOGLOBIN A1C: 6.7

## 2017-05-20 ENCOUNTER — Encounter: Payer: Self-pay | Admitting: Family Medicine

## 2017-05-20 ENCOUNTER — Ambulatory Visit (INDEPENDENT_AMBULATORY_CARE_PROVIDER_SITE_OTHER): Payer: BC Managed Care – PPO | Admitting: Family Medicine

## 2017-05-20 VITALS — BP 100/58 | HR 98 | Temp 98.6°F | Resp 18 | Wt 180.4 lb

## 2017-05-20 DIAGNOSIS — I10 Essential (primary) hypertension: Secondary | ICD-10-CM | POA: Diagnosis not present

## 2017-05-20 DIAGNOSIS — M3500 Sicca syndrome, unspecified: Secondary | ICD-10-CM | POA: Diagnosis not present

## 2017-05-20 DIAGNOSIS — R700 Elevated erythrocyte sedimentation rate: Secondary | ICD-10-CM

## 2017-05-20 DIAGNOSIS — K3184 Gastroparesis: Secondary | ICD-10-CM

## 2017-05-20 DIAGNOSIS — E782 Mixed hyperlipidemia: Secondary | ICD-10-CM

## 2017-05-20 DIAGNOSIS — Z803 Family history of malignant neoplasm of breast: Secondary | ICD-10-CM | POA: Diagnosis not present

## 2017-05-20 DIAGNOSIS — R609 Edema, unspecified: Secondary | ICD-10-CM

## 2017-05-20 DIAGNOSIS — E1159 Type 2 diabetes mellitus with other circulatory complications: Secondary | ICD-10-CM

## 2017-05-20 HISTORY — DX: Family history of malignant neoplasm of breast: Z80.3

## 2017-05-20 LAB — MICROALBUMIN / CREATININE URINE RATIO
CREATININE, U: 149.9 mg/dL
MICROALB/CREAT RATIO: 0.7 mg/g (ref 0.0–30.0)
Microalb, Ur: 1 mg/dL (ref 0.0–1.9)

## 2017-05-20 MED ORDER — ATORVASTATIN CALCIUM 20 MG PO TABS: ORAL_TABLET | ORAL | 1 refills | Status: DC

## 2017-05-20 MED ORDER — OMEPRAZOLE 20 MG PO CPDR: DELAYED_RELEASE_CAPSULE | ORAL | 1 refills | Status: DC

## 2017-05-20 MED ORDER — LOSARTAN POTASSIUM 25 MG PO TABS: 25.0000 mg | ORAL_TABLET | Freq: Every day | ORAL | 1 refills | Status: DC

## 2017-05-20 MED ORDER — AMLODIPINE BESYLATE 5 MG PO TABS: 5.0000 mg | ORAL_TABLET | Freq: Two times a day (BID) | ORAL | 1 refills | Status: DC

## 2017-05-20 NOTE — Assessment & Plan Note (Signed)
Encouraged heart healthy diet, increase exercise, avoid trans fats, consider a krill oil cap daily. Tolerating Atorvastatin 

## 2017-05-20 NOTE — Progress Notes (Signed)
Subjective:  I acted as a Education administrator for Dr. Charlett Blake. Lori Zamora, [place]  Patient ID: Lori Zamora, female    DOB: 06/09/1956, 61 y.o.   MRN: [id number]  No chief complaint on file.   HPI  Patient is in today for a follow up. Is feeling well this morning, no recent febrile illness or acute hospitalizations. Is stillstruggling with dry mouth etc with the Sjogren's notes a hgba1c of 6.7 with her endocrinologist. Does not endorse polyuria or polydipsia. Is managin her peripheral edema conservatively. Denies CP/palp/SOB/HA/congestion/fevers/GI or GU c/o. Taking meds as prescribed  Patient Care Team: Mosie Lukes, MD as PCP - General (Family Medicine) Delrae Rend, MD as Consulting Physician (Endocrinology) Luberta Mutter, MD as Consulting Physician (Ophthalmology) Charlean Sanfilippo, MD as Referring Physician (Gastroenterology) Newell Coral., MD as Referring Physician (Gastroenterology)   Past Medical History:  Diagnosis Date  . Abdominal pain, unspecified site 11/29/2013  . Acquired acanthosis nigricans   . Anemia, unspecified   . 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 Bipolar 2 disorder ([place]) 01/31/2010   Qualifier: Diagnosis of  By: Fuller Plan CMA (AAMA), Terri Skains  Follows with Dr Letta Moynahan of psychiatry   . Bipolar disorder, unspecified ([place])   . CFS (chronic fatigue syndrome)   . Degeneration of intervertebral disc, site unspecified   . Depressive disorder, not elsewhere classified   . Esophageal reflux   . FH: breast cancer 05/20/2017  . Gastroparesis 06/07/2015  . Intertriginous candidiasis 11/29/2013  . 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 Microcytic anemia 01/31/2010   Qualifier: Diagnosis of  By: Fuller Plan CMA (AAMA), [place]    . Migraine with aura, without mention of intractable migraine without mention of status migrainosus   . Myalgia and myositis, unspecified   . Nontoxic multinodular goiter   . Other and unspecified hyperlipidemia   . Other malaise and  fatigue   . Other specified iron deficiency anemias   . Other thalassemia ([place])   . Parotid gland enlargement 2011   related to connective tissue syndrome  . Pelvic floor dysfunction 01/27/2013  . Peripheral edema 02/19/2017  . Preventative health care 09/12/2014  . Renal insufficiency 01/27/2013  . Sjogren's syndrome ([place])   . Small intestinal bacterial overgrowth 11/29/2013  . Systemic lupus erythematosus ([place])   . Thrombocytosis ([place]) 06/07/2016  . Type II or unspecified type diabetes mellitus without mention of complication, not stated as uncontrolled   . Unspecified constipation   . Unspecified diffuse connective tissue disease 01/27/2013   Follows at [facility] rheumatology, Dr Alanda Amass Per patient has tested 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 antibody  . Unspecified essential hypertension   . Unspecified sleep apnea   . Unspecified vitamin D deficiency     Past Surgical History:  Procedure Laterality Date  . BIOPSY THYROID     01/13/2015, 3 previous biopsy reported all benign  . LAPAROSCOPIC TOTAL HYSTERECTOMY  2004    Family History  Problem Relation Age of Onset  . Diabetes Mother   . Heart disease Mother        family history  . Allergies Mother   . Alcohol abuse Unknown        Family history of alcoholism and addiction  . Alcohol abuse Brother        drug abuse  . Heart disease Maternal Grandmother   . Heart disease Maternal Grandfather   . Obesity Sister   . Hypertension Unknown        family  history  . Kidney disease Unknown        family history  . Stroke Unknown        1st degree relative <60    Social History   Social History  . Marital status: Single    Spouse name: N/A  . Number of children: 0  . Years of education: N/A   Occupational History  . Teacher    Social History Main Topics  . Smoking status: Never Smoker  . Smokeless tobacco: Never Used  . Alcohol use No  . Drug use: No  . Sexual activity: No       Comment: lives alone, no major dietary restrictions, retired from teaching kindergarten   Other Topics Concern  . 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 alclomethasone (ACLOVATE) 0.05 % cream Apply 1 application topically 2 (two) times daily.    . betamethasone dipropionate 0.05 % lotion Apply 1 application topically 2 (two) times daily.    . Blood Glucose Calibration (OT ULTRA/FASTTK CNTRL SOLN) SOLN Check Sugars prn  1  . cholecalciferol (VITAMIN D) 1000 UNITS tablet Take 2,000 Units by mouth daily.     . diphenhydrAMINE (BENADRYL) 25 MG tablet Take 25 mg by mouth as needed.    . empagliflozin (JARDIANCE) 10 MG TABS tablet Take 10 mg by mouth daily.    Marland Kitchen glucose blood test strip 1 each by Other route daily. One Touch Ultra     . ibuprofen (ADVIL) 200 MG tablet Take 200 mg by mouth every 6 (six) hours as needed.      Marland Kitchen ketoconazole (NIZORAL) 2 % cream APPLY EXTERNALLY TO THE AFFECTED AREA DAILY 60 g 0  . Lidocaine, Anorectal, 5 % CREA Apply topically as needed.    . Magnesium Oxide 500 MG (LAX) TABS Take by mouth daily. Take 1-4 tablets daily.    . metFORMIN (GLUCOPHAGE) 500 MG tablet Take 1,000 mg by mouth 2 (two) times daily with a meal.     . NON FORMULARY Lubricant eye ointment- sterile mineral oil 39.9% White Petroleum 57.7%    . NON FORMULARY Biotene dry mouth oral rinse    . Polyethyl Glycol-Propyl Glycol (SYSTANE) 0.4-0.3 % GEL Apply to eye.    Marland Kitchen PRESCRIPTION MEDICATION prevident 5000- dry mouth toothpaste    . Probiotic Product (DIGESTIVE ADVANTAGE GUMMIES PO) Take by mouth 2 (two) times daily.    Marland Kitchen RA SUNSCREEN SPF50 EX Apply topically as needed.      Marland Kitchen amLODipine (NORVASC) 5 MG tablet TAKE 1 TABLET BY MOUTH TWICE DAILY 180 tablet 0  . atorvastatin (LIPITOR) 20 MG tablet TAKE 1 TABLET(20 MG) BY MOUTH THREE TIMES DAILY 270 tablet 0  .  losartan (COZAAR) 25 MG tablet Take 1 tablet (25 mg total) by mouth daily. 30 tablet 3  . omeprazole (PRILOSEC) 20 MG capsule TAKE 1 CAPSULE(20 MG) BY MOUTH DAILY 90 capsule 1   No facility-administered medications prior to visit.     Allergies  Allergen Reactions  . Penicillins Swelling and Anaphylaxis    Swelling of tongue.  . Sulfa Antibiotics Other (See Comments) and Rash    Other Reaction: swellin of tongue  . Sulfonamide Derivatives Swelling    REACTION: Swelling of tongue and face  . Acetazolamide   . Azathioprine Other (See Comments)    Very low TPMT in  deficient range Unknown  . Ezetimibe Other (See Comments)    Unknown  . Lisinopril Other (See Comments)    Unknown  . Meloxicam Other (See Comments)    Unknown  . Pravastatin Sodium Other (See Comments)    Unknown  . Simvastatin Other (See Comments)    Unknown  . Latex Hives and Rash    Review of Systems  Constitutional: Positive for malaise/fatigue. Negative for fever.  HENT: Negative for congestion.   Eyes: Negative for blurred vision.  Respiratory: Negative for shortness of breath.   Cardiovascular: Positive for leg swelling. Negative for chest pain and palpitations.  Gastrointestinal: Negative for abdominal pain, blood in stool and nausea.  Genitourinary: Negative for dysuria and frequency.  Musculoskeletal: Negative for falls.  Skin: Negative for rash.  Neurological: Negative for dizziness, loss of consciousness and headaches.  Endo/Heme/Allergies: Negative for environmental allergies.  Psychiatric/Behavioral: Negative for depression. The patient is not nervous/anxious.        Objective:    Physical Exam  Constitutional: She is oriented to person, place, and time. She appears well-developed and well-nourished. No distress.  HENT:  Head: Normocephalic and atraumatic.  Nose: Nose normal.  Dry mucus membranes  Eyes: Right eye exhibits no discharge. Left eye exhibits no discharge.  Neck: Normal range of  motion. Neck supple.  Cardiovascular: Normal rate and regular rhythm.   No murmur heard. Pulmonary/Chest: Effort normal and breath sounds normal.  Abdominal: Soft. Bowel sounds are normal. There is no tenderness.  Musculoskeletal: She exhibits no edema.  Neurological: She is alert and oriented to person, place, and time.  Skin: Skin is warm and dry.  Psychiatric: She has a normal mood and affect.  Nursing note and vitals reviewed.   BP (!) 100/58 (BP Location: Left Arm, Patient Position: Sitting, Cuff Size: Normal)   Pulse 98   Temp 98.6 F (37 C) (Oral)   Resp 18   Wt 180 lb 6.4 oz (81.8 kg)   SpO2 98%   BMI 29.56 kg/m  Wt Readings from Last 3 Encounters:  05/20/17 180 lb 6.4 oz (81.8 kg)  02/20/17 182 lb 12.8 oz (82.9 kg)  02/18/17 182 lb 9.6 oz (82.8 kg)   BP Readings from Last 3 Encounters:  05/20/17 (!) 100/58  02/20/17 117/75  02/18/17 98/66     Immunization History  Administered Date(s) Administered  . Hep A / Hep B 05/11/2013, 06/10/2013, 11/09/2013  . Influenza Split 05/25/2011, 05/29/2012  . Influenza Whole 05/28/2008  . Influenza,inj,Quad PF,6+ Mos 05/18/2013, 06/04/2014, 06/07/2015, 06/07/2016  . Pneumococcal Conjugate-13 07/27/2013  . Pneumococcal Polysaccharide-23 12/16/2003  . Td 12/16/2003  . Tdap 11/26/2013    Health Maintenance  Topic Date Due  . PNEUMOCOCCAL POLYSACCHARIDE VACCINE (2) 12/15/2008  . INFLUENZA VACCINE  03/13/2017  . HEMOGLOBIN A1C  07/16/2017  . OPHTHALMOLOGY EXAM  10/17/2017  . FOOT EXAM  11/07/2017  . COLONOSCOPY  03/12/2018  . MAMMOGRAM  05/01/2018  . PAP SMEAR  03/08/2019  . TETANUS/TDAP  11/27/2023  . Hepatitis C Screening  Completed  . HIV Screening  Completed    Lab Results  Component Value Date   WBC 8.7 02/18/2017   HGB 11.6 (L) 02/18/2017   HCT 36.9 02/18/2017   PLT 479.0 (H) 02/18/2017   GLUCOSE 99 02/18/2017   CHOL 146 01/14/2017   TRIG 127.0 01/14/2017   HDL 35.90 (L) 01/14/2017   LDLDIRECT 95  12/22/2009   LDLCALC 85 01/14/2017   ALT 13 02/18/2017   AST 10 02/18/2017  NA 142 02/18/2017   K 4.5 02/18/2017   CL 105 02/18/2017   CREATININE 0.89 02/18/2017   BUN 14 02/18/2017   CO2 26 02/18/2017   TSH 1.61 02/18/2017   HGBA1C 7.2 (H) 01/14/2017   MICROALBUR 4.05 (H) 03/22/2014    Lab Results  Component Value Date   TSH 1.61 02/18/2017   Lab Results  Component Value Date   WBC 8.7 02/18/2017   HGB 11.6 (L) 02/18/2017   HCT 36.9 02/18/2017   MCV 76.8 (L) 02/18/2017   PLT 479.0 (H) 02/18/2017   Lab Results  Component Value Date   NA 142 02/18/2017   K 4.5 02/18/2017   CO2 26 02/18/2017   GLUCOSE 99 02/18/2017   BUN 14 02/18/2017   CREATININE 0.89 02/18/2017   BILITOT 0.3 02/18/2017   ALKPHOS 105 02/18/2017   AST 10 02/18/2017   ALT 13 02/18/2017   PROT 7.3 02/18/2017   ALBUMIN 4.2 02/18/2017   CALCIUM 9.8 02/18/2017   ANIONGAP 13.4 12/22/2009   GFR 83.19 02/18/2017   Lab Results  Component Value Date   CHOL 146 01/14/2017   Lab Results  Component Value Date   HDL 35.90 (L) 01/14/2017   Lab Results  Component Value Date   LDLCALC 85 01/14/2017   Lab Results  Component Value Date   TRIG 127.0 01/14/2017   Lab Results  Component Value Date   CHOLHDL 4 01/14/2017   Lab Results  Component Value Date   HGBA1C 7.2 (H) 01/14/2017         Assessment & Plan:   Problem List Items Addressed This Visit    Sjogren's syndrome ([place]) (Chronic)    New dentist Dr Daiva Huge      Hyperlipidemia, mixed    Encouraged heart healthy diet, increase exercise, avoid trans fats, consider a krill oil cap daily. Tolerating Atorvastatin      Relevant Medications   losartan (COZAAR) 25 MG tablet   amLODipine (NORVASC) 5 MG tablet   atorvastatin (LIPITOR) 20 MG tablet   Other Relevant Orders   Lipid panel   Essential hypertension    Well controlled, no changes to meds. Encouraged heart healthy diet such as the DASH diet and exercise as tolerated.        Relevant Medications   losartan (COZAAR) 25 MG tablet   amLODipine (NORVASC) 5 MG tablet   atorvastatin (LIPITOR) 20 MG tablet   Other Relevant Orders   Comprehensive metabolic panel   TSH   CBC   Gastroparesis    Still struggles with this, cannot eat fresh veg or high fiber foods but if she is careful she does not have overwhelming N/V      Elevated sed rate - Primary   Relevant Orders   Sedimentation rate   Peripheral edema    Tried some heavy weight compression hose but they were too constrictive. Has moved to medium weight and that is better. Is also elevating feet and trying to minimize sodium. May have to consider diuretics in the future      Diabetes mellitus [facility])    Is following with endocrinology, Dr Buddy Duty just rechecked her hgba1c on 04/24/2017. Chooses to check sugars daily, needs microalbumin       Relevant Medications   losartan (COZAAR) 25 MG tablet   atorvastatin (LIPITOR) 20 MG tablet   Other Relevant Orders   Microalbumin / creatinine urine ratio   Hemoglobin A1c   FH: breast cancer    Sister just diagnosed with breast  cancer at age 51. Has finished her treatment at [facility] in [place]. Patient will continue with annual MGMs         I have changed Ms. Matusek's amLODipine. I am also having her maintain her metFORMIN, cholecalciferol, glucose blood, RA SUNSCREEN SPF50 EX, ibuprofen, Magnesium Oxide, diphenhydrAMINE, Lidocaine (Anorectal), PRESCRIPTION MEDICATION, NON FORMULARY, NON FORMULARY, Polyethyl Glycol-Propyl Glycol, Probiotic Product (DIGESTIVE ADVANTAGE GUMMIES PO), empagliflozin, alclomethasone, betamethasone dipropionate, ketoconazole, OT ULTRA/FASTTK CNTRL SOLN, losartan, atorvastatin, and omeprazole.  Meds ordered this encounter  Medications  . losartan (COZAAR) 25 MG tablet    Sig: Take 1 tablet (25 mg total) by mouth daily.    Dispense:  90 tablet    Refill:  1  . amLODipine (NORVASC) 5 MG tablet    Sig: Take 1  tablet (5 mg total) by mouth 2 (two) times daily.    Dispense:  180 tablet    Refill:  1  . atorvastatin (LIPITOR) 20 MG tablet    Sig: TAKE 1 TABLET(20 MG) BY MOUTH THREE TIMES DAILY    Dispense:  270 tablet    Refill:  1  . omeprazole (PRILOSEC) 20 MG capsule    Sig: TAKE 1 CAPSULE(20 MG) BY MOUTH DAILY    Dispense:  90 capsule    Refill:  1    CMA served as scribe during this visit. History, Physical and Plan performed by medical provider. Documentation and orders reviewed and attested to.  [name], MD

## 2017-05-20 NOTE — Assessment & Plan Note (Addendum)
Is following with endocrinology, Dr Buddy Duty just rechecked her hgba1c on 04/24/2017. Chooses to check sugars daily, needs microalbumin

## 2017-05-20 NOTE — Patient Instructions (Signed)
Dry Eye Dry eye, also called keratoconjunctivitis sicca, is dryness of the membranes surrounding the eye. It happens when there are not enough healthy, natural tears in the eyes. The eyes must remain moist at all times. A small amount of tears is constantly produced by the tear glands (lacrimal glands). These glands are located under the outside part of the upper eyelids. Dryness of the eyes can be a symptom of a variety of conditions, such as rheumatoid arthritis, lupus, or Sjgren syndrome. Dry eye may be mild to severe. What are the causes? This condition may be caused by:  Not making enough tears (aqueous tear-deficient dry eyes).  Tears evaporating from the eye too quickly (evaporative dry eyes). This is when there is an abnormality in the quality of your tears. This abnormality causes your tears to evaporate so quickly that the eye cannot be kept moist.  What increases the risk? This condition is more likely to happen in:  Women, especially those who have gone through menopause.  People in dry climates.  People in dusty or smoky areas.  People who take certain medicines, such as: ? Anti-allergy medicines (antihistamines). ? Blood pressure medicines (antihypertensives). ? Birth control pills (oral contraceptives). ? Laxatives. ? Tranquilizers.  What are the signs or symptoms? Symptoms in the eyes may include:  Irritation.  Itchiness.  Redness.  Burning.  Inflammation of the eyelids.  Feeling as though something is stuck in the eye.  Light sensitivity.  Increased sensitivity and discomfort when wearing contact lenses, if this applies.  Vision that varies throughout the day.  Occasional excessive tearing.  How is this diagnosed? This condition is diagnosed based on your symptoms, your medical history, and an eye exam. Your health care provider may look at your eye using a microscope and may put dyes in your eye to check the health of the surface of your eye. You  may have a tests, such as a test to evaluate your tear production (Schirmer test). During this test, a small strip of special paper is gently pressed into the inner corner of your eye. Your tear production is measured by how much of the paper is moistened by your tears during a set amount of time. You may be referred to a health care provider who specializes in eyes and eyesight (ophthalmologist). How is this treated? This condition is often treated at home. Your health care provider may recommend eye drops, which are also called artificial tears. If your condition is severe, treatment may include:  Prescription eye drops.  Over-the-counter or prescription ointments to moisten your eyes.  Minor surgery to block tears from going into your nose.  Medicines to reduce inflammation of the eyelids.  Follow these instructions at home:  Take, use, or apply over-the-counter and prescription medicines only as told by your health care provider. This includes eye drops.  If directed, apply a warm compress to your eyes to help reduce inflammation. Place a towel over your eyes and gently press the warm compress over your eyes for about 5 minutes, or as long as told by your health care provider.  If possible, avoid dry, drafty environments.  Use a humidifier at home to increase moisture in the air.  If you wear contact lenses, remove them regularly to give your eyes a break. Always remove contacts before sleeping.  Keep all follow-up visits as told by your health care provider. This is important. This includes yearly eye exams and vision tests. Contact a health care provider if:    You have eye pain.  You have pus-like fluid coming from your eye.  Your symptoms get worse or do not improve with treatment. Get help right away if:  Your vision suddenly changes. This information is not intended to replace advice given to you by your health care provider. Make sure you discuss any questions you have  with your health care provider. Document Released: 06/16/2004 Document Revised: 01/05/2016 Document Reviewed: 05/25/2015 Elsevier Interactive Patient Education  2018 [employer]  

## 2017-05-20 NOTE — Assessment & Plan Note (Signed)
Well controlled, no changes to meds. Encouraged heart healthy diet such as the DASH diet and exercise as tolerated.  °

## 2017-05-20 NOTE — Assessment & Plan Note (Signed)
Tried some heavy weight compression hose but they were too constrictive. Has moved to medium weight and that is better. Is also elevating feet and trying to minimize sodium. May have to consider diuretics in the future

## 2017-05-20 NOTE — Assessment & Plan Note (Signed)
Sister just diagnosed with breast cancer at age 60. Has finished her treatment at [facility] in [place]. Patient will continue with annual [facility]

## 2017-05-20 NOTE — Assessment & Plan Note (Signed)
New dentist Dr Daiva Huge

## 2017-05-20 NOTE — Assessment & Plan Note (Signed)
Still struggles with this, cannot eat fresh veg or high fiber foods but if she is careful she does not have overwhelming N/V

## 2017-05-23 ENCOUNTER — Encounter: Payer: Self-pay | Admitting: Family Medicine

## 2017-05-28 ENCOUNTER — Other Ambulatory Visit: Payer: Self-pay | Admitting: Family Medicine

## 2017-05-28 DIAGNOSIS — Z1231 Encounter for screening mammogram for malignant neoplasm of breast: Secondary | ICD-10-CM

## 2017-06-03 ENCOUNTER — Encounter (HOSPITAL_BASED_OUTPATIENT_CLINIC_OR_DEPARTMENT_OTHER): Payer: Self-pay

## 2017-06-03 ENCOUNTER — Ambulatory Visit (HOSPITAL_BASED_OUTPATIENT_CLINIC_OR_DEPARTMENT_OTHER)
Admission: RE | Admit: 2017-06-03 | Discharge: 2017-06-03 | Disposition: A | Discharge status: Home / Self Care | Payer: BC Managed Care – PPO | Source: Ambulatory Visit | Attending: Family Medicine | Admitting: Family Medicine

## 2017-06-03 DIAGNOSIS — Z1231 Encounter for screening mammogram for malignant neoplasm of breast: Secondary | ICD-10-CM | POA: Insufficient documentation

## 2017-06-27 ENCOUNTER — Other Ambulatory Visit: Payer: Self-pay | Admitting: Family Medicine

## 2017-07-30 LAB — HM DIABETES EYE EXAM

## 2017-08-22 ENCOUNTER — Ambulatory Visit: Payer: BC Managed Care – PPO | Admitting: Family Medicine

## 2017-08-22 VITALS — BP 118/62 | HR 86 | Temp 98.3°F | Resp 18 | Wt 179.4 lb

## 2017-08-22 DIAGNOSIS — H5789 Other specified disorders of eye and adnexa: Secondary | ICD-10-CM

## 2017-08-22 DIAGNOSIS — L309 Dermatitis, unspecified: Secondary | ICD-10-CM | POA: Diagnosis not present

## 2017-08-22 DIAGNOSIS — I10 Essential (primary) hypertension: Secondary | ICD-10-CM | POA: Diagnosis not present

## 2017-08-22 DIAGNOSIS — E559 Vitamin D deficiency, unspecified: Secondary | ICD-10-CM

## 2017-08-22 DIAGNOSIS — E782 Mixed hyperlipidemia: Secondary | ICD-10-CM | POA: Diagnosis not present

## 2017-08-22 DIAGNOSIS — E1159 Type 2 diabetes mellitus with other circulatory complications: Secondary | ICD-10-CM

## 2017-08-22 DIAGNOSIS — M797 Fibromyalgia: Secondary | ICD-10-CM

## 2017-08-22 LAB — LIPID PANEL
Cholesterol: 151 mg/dL (ref 0–200)
HDL: 40.3 mg/dL (ref >39.00)
LDL Cholesterol: 91 mg/dL (ref 0–99)
NonHDL: 111.14
Total CHOL/HDL Ratio: 4
Triglycerides: 102 mg/dL (ref 0.0–149.0)
VLDL: 20.4 mg/dL (ref 0.0–40.0)

## 2017-08-22 LAB — COMPREHENSIVE METABOLIC PANEL
ALBUMIN: 4.4 g/dL (ref 3.5–5.2)
ALK PHOS: 100 U/L (ref 39–117)
ALT: 14 U/L (ref 0–35)
AST: 12 U/L (ref 0–37)
BILIRUBIN TOTAL: 0.3 mg/dL (ref 0.2–1.2)
BUN: 13 mg/dL (ref 6–23)
CO2: 28 mEq/L (ref 19–32)
CREATININE: 0.81 mg/dL (ref 0.40–1.20)
Calcium: 9.9 mg/dL (ref 8.4–10.5)
Chloride: 103 mEq/L (ref 96–112)
GFR: 92.58 mL/min (ref >60.00)
Glucose, Bld: 99 mg/dL (ref 70–99)
POTASSIUM: 3.8 meq/L (ref 3.5–5.1)
SODIUM: 140 meq/L (ref 135–145)
TOTAL PROTEIN: 7.7 g/dL (ref 6.0–8.3)

## 2017-08-22 LAB — TSH: TSH: 1.22 u[IU]/mL (ref 0.35–4.50)

## 2017-08-22 LAB — CBC
HEMATOCRIT: 38.3 % (ref 36.0–46.0)
Hemoglobin: 11.9 g/dL — ABNORMAL LOW (ref 12.0–15.0)
MCHC: 31 g/dL (ref 30.0–36.0)
MCV: 76.9 fl — ABNORMAL LOW (ref 78.0–100.0)
Platelets: 519 10*3/uL — ABNORMAL HIGH (ref 150.0–400.0)
RBC: 4.99 Mil/uL (ref 3.87–5.11)
RDW: 18.3 % — AB (ref 11.5–15.5)
WBC: 6.6 10*3/uL (ref 4.0–10.5)

## 2017-08-22 LAB — VITAMIN D 25 HYDROXY (VIT D DEFICIENCY, FRACTURES): VITD: 50.72 ng/mL (ref 30.00–100.00)

## 2017-08-22 LAB — HEMOGLOBIN A1C: HEMOGLOBIN A1C: 6.9 % — AB (ref 4.6–6.5)

## 2017-08-22 MED ORDER — KETOCONAZOLE 2 % EX CREA: TOPICAL_CREAM | CUTANEOUS | 1 refills | Status: DC

## 2017-08-22 NOTE — Assessment & Plan Note (Signed)
Had a abad flare with severe fatigue and increased generalized pain for the month between thanksgiving to Christmas is better. No obvious triggers

## 2017-08-22 NOTE — Assessment & Plan Note (Signed)
Well controlled, no changes to meds. Encouraged heart healthy diet such as the DASH diet and exercise as tolerated.  °

## 2017-08-22 NOTE — Assessment & Plan Note (Addendum)
Check level today and maintain supplements

## 2017-08-22 NOTE — Patient Instructions (Addendum)
Consider the new shingles shot called Shingrix 2 sho Hypertension Hypertension is another name for high blood pressure. High blood pressure forces your heart to work harder to pump blood. This can cause problems over time. There are two numbers in a blood pressure reading. There is a top number (systolic) over a bottom number (diastolic). It is best to have a blood pressure below 120/80. Healthy choices can help lower your blood pressure. You may need medicine to help lower your blood pressure if:  Your blood pressure cannot be lowered with healthy choices.  Your blood pressure is higher than 130/80.  Follow these instructions at home: Eating and drinking  If directed, follow the DASH eating plan. This diet includes: ? Filling half of your plate at each meal with fruits and vegetables. ? Filling one quarter of your plate at each meal with whole grains. Whole grains include whole wheat pasta, brown rice, and whole grain bread. ? Eating or drinking low-fat dairy products, such as skim milk or low-fat yogurt. ? Filling one quarter of your plate at each meal with low-fat (lean) proteins. Low-fat proteins include fish, skinless chicken, eggs, beans, and tofu. ? Avoiding fatty meat, cured and processed meat, or chicken with skin. ? Avoiding premade or processed food.  Eat less than 1,500 mg of salt (sodium) a day.  Limit alcohol use to no more than 1 drink a day for nonpregnant women and 2 drinks a day for men. One drink equals 12 oz of beer, 5 oz of wine, or 1 oz of hard liquor. Lifestyle  Work with your doctor to stay at a healthy weight or to lose weight. Ask your doctor what the best weight is for you.  Get at least 30 minutes of exercise that causes your heart to beat faster (aerobic exercise) most days of the week. This may include walking, swimming, or biking.  Get at least 30 minutes of exercise that strengthens your muscles (resistance exercise) at least 3 days a week. This may  include lifting weights or pilates.  Do not use any products that contain nicotine or tobacco. This includes cigarettes and e-cigarettes. If you need help quitting, ask your doctor.  Check your blood pressure at home as told by your doctor.  Keep all follow-up visits as told by your doctor. This is important. Medicines  Take over-the-counter and prescription medicines only as told by your doctor. Follow directions carefully.  Do not skip doses of blood pressure medicine. The medicine does not work as well if you skip doses. Skipping doses also puts you at risk for problems.  Ask your doctor about side effects or reactions to medicines that you should watch for. Contact a doctor if:  You think you are having a reaction to the medicine you are taking.  You have headaches that keep coming back (recurring).  You feel dizzy.  You have swelling in your ankles.  You have trouble with your vision. Get help right away if:  You get a very bad headache.  You start to feel confused.  You feel weak or numb.  You feel faint.  You get very bad pain in your: ? Chest. ? Belly (abdomen).  You throw up (vomit) more than once.  You have trouble breathing. Summary  Hypertension is another name for high blood pressure.  Making healthy choices can help lower blood pressure. If your blood pressure cannot be controlled with healthy choices, you may need to take medicine. This information is not intended to  replace advice given to you by your health care provider. Make sure you discuss any questions you have with your health care provider. Document Released: 01/16/2008 Document Revised: 06/27/2016 Document Reviewed: 06/27/2016 Elsevier Interactive Patient Education  2018 [employer]. ts over 2-6 months

## 2017-08-22 NOTE — Progress Notes (Signed)
Subjective:  I acted as a Education administrator for Dr. Charlett Blake. [place], [place]  Patient ID: Lori Zamora, female    DOB: 12-02-1955, 62 y.o.   MRN: [id number]  No chief complaint on file.   HPI  Patient is in today for a 3 month follow up and overall she is at her baseline.  She continues to struggle with daily pain and fatigue but has had no recent flares or hospitalizations.  She had not noticed some increased redness in her left eye over the last couple months but denies any visual changes, discharge, pain or trauma.  She has been seen by her ophthalmologist and no obvious cause has been found. Denies CP/palp/SOB/HA/congestion/fevers/GI or GU c/o. Taking meds as prescribed  Patient Care Team: Mosie Lukes, MD as PCP - General (Family Medicine) Delrae Rend, MD as Consulting Physician (Endocrinology) Luberta Mutter, MD as Consulting Physician (Ophthalmology) Charlean Sanfilippo, MD as Referring Physician (Gastroenterology) Newell Coral., MD as Referring Physician (Gastroenterology)   Past Medical History:  Diagnosis Date  . Abdominal pain, unspecified site 11/29/2013  . Acquired acanthosis nigricans   . Anemia, unspecified   . 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 Bipolar 2 disorder ([place]) 01/31/2010   Qualifier: Diagnosis of  By: Fuller Plan CMA (AAMA), Terri Skains  Follows with Dr Letta Moynahan of psychiatry   . Bipolar disorder, unspecified ([place])   . CFS (chronic fatigue syndrome)   . Degeneration of intervertebral disc, site unspecified   . Depressive disorder, not elsewhere classified   . Esophageal reflux   . FH: breast cancer 05/20/2017  . Gastroparesis 06/07/2015  . Intertriginous candidiasis 11/29/2013  . 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 Microcytic anemia 01/31/2010   Qualifier: Diagnosis of  By: Fuller Plan CMA (AAMA), [place]    . Migraine with aura, without mention of intractable migraine without mention of status migrainosus   . Myalgia and myositis, unspecified     . Nontoxic multinodular goiter   . Other and unspecified hyperlipidemia   . Other malaise and fatigue   . Other specified iron deficiency anemias   . Other thalassemia ([place])   . Parotid gland enlargement 2011   related to connective tissue syndrome  . Pelvic floor dysfunction 01/27/2013  . Peripheral edema 02/19/2017  . Preventative health care 09/12/2014  . Renal insufficiency 01/27/2013  . Sjogren's syndrome ([place])   . Small intestinal bacterial overgrowth 11/29/2013  . Systemic lupus erythematosus ([place])   . Thrombocytosis ([place]) 06/07/2016  . Type II or unspecified type diabetes mellitus without mention of complication, not stated as uncontrolled   . Unspecified constipation   . Unspecified diffuse connective tissue disease 01/27/2013   Follows at [facility] rheumatology, Dr Alanda Amass Per patient has tested 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 antibody  . Unspecified essential hypertension   . Unspecified sleep apnea   . Unspecified vitamin D deficiency     Past Surgical History:  Procedure Laterality Date  . BIOPSY THYROID     01/13/2015, 3 previous biopsy reported all benign  . LAPAROSCOPIC TOTAL HYSTERECTOMY  2004    Family History  Problem Relation Age of Onset  . Diabetes Mother   . Heart disease Mother        family history  . Allergies Mother   . Alcohol abuse Unknown        Family history of alcoholism and addiction  . Alcohol abuse Brother        drug abuse  . Heart  disease Maternal Grandmother   . Heart disease Maternal Grandfather   . Obesity Sister   . Hypertension Unknown        family history  . Kidney disease Unknown        family history  . Stroke Unknown        1st degree relative <60    Social History   Socioeconomic History  . Marital status: Single    Spouse name: Not on file  . Number of children: 0  . Years of education: Not on file  . Highest education level: Not on file  Social Needs  . Financial  resource strain: Not on file  . Food insecurity - worry: Not on file  . Food insecurity - inability: Not on file  . Transportation needs - medical: Not on file  . Transportation needs - non-medical: Not on file  Occupational History  . Occupation: Pharmacist, hospital  Tobacco Use  . Smoking status: Never Smoker  . Smokeless tobacco: Never Used  Substance and Sexual Activity  . Alcohol use: No  . Drug use: No  . Sexual activity: No    Birth control/protection: None    Comment: lives alone, no major dietary restrictions, retired from teaching kindergarten  Other Topics Concern  . 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 alclomethasone (ACLOVATE) 0.05 % cream Apply 1 application topically 2 (two) times daily.    Marland Kitchen amLODipine (NORVASC) 5 MG tablet Take 1 tablet (5 mg total) by mouth 2 (two) times daily. 180 tablet 1  . atorvastatin (LIPITOR) 20 MG tablet TAKE 1 TABLET(20 MG) BY MOUTH THREE TIMES DAILY 270 tablet 1  . betamethasone dipropionate 0.05 % lotion Apply 1 application topically 2 (two) times daily.    . Blood Glucose Calibration (OT ULTRA/FASTTK CNTRL SOLN) SOLN Check Sugars prn  1  . cholecalciferol (VITAMIN D) 1000 UNITS tablet Take 2,000 Units by mouth daily.     . diphenhydrAMINE (BENADRYL) 25 MG tablet Take 25 mg by mouth as needed.    . empagliflozin (JARDIANCE) 10 MG TABS tablet Take 10 mg by mouth daily.    Marland Kitchen glucose blood test strip 1 each by Other route daily. One Touch Ultra     . Lidocaine, Anorectal, 5 % CREA Apply topically as needed.    Marland Kitchen losartan (COZAAR) 25 MG tablet Take 1 tablet (25 mg total) by mouth daily. 90 tablet 1  . Magnesium Oxide 500 MG (LAX) TABS Take by mouth daily. Take 1-4 tablets daily.    . metFORMIN (GLUCOPHAGE) 500 MG tablet Take 1,000 mg by mouth 2 (two) times daily with a meal.     . NON FORMULARY Lubricant eye  ointment- sterile mineral oil 39.9% White Petroleum 57.7%    . NON FORMULARY Biotene dry mouth oral rinse    . omeprazole (PRILOSEC) 20 MG capsule TAKE 1 CAPSULE(20 MG) BY MOUTH DAILY 90 capsule 1  . Polyethyl Glycol-Propyl Glycol (SYSTANE) 0.4-0.3 % GEL Apply to eye.    Marland Kitchen PRESCRIPTION MEDICATION prevident 5000- dry mouth toothpaste    . Probiotic Product (DIGESTIVE ADVANTAGE GUMMIES PO) Take by mouth 2 (two) times daily.    Marland Kitchen RA SUNSCREEN SPF50 EX Apply topically as needed.      Marland Kitchen amLODipine (NORVASC) 5 MG tablet TAKE 1 TABLET BY MOUTH TWICE DAILY 180 tablet 0  . ibuprofen (  ADVIL) 200 MG tablet Take 200 mg by mouth every 6 (six) hours as needed.      Marland Kitchen ketoconazole (NIZORAL) 2 % cream APPLY EXTERNALLY TO THE AFFECTED AREA DAILY 60 g 0   No facility-administered medications prior to visit.     Allergies  Allergen Reactions  . Penicillins Swelling and Anaphylaxis    Swelling of tongue.  . Sulfa Antibiotics Other (See Comments) and Rash    Other Reaction: swellin of tongue  . Sulfonamide Derivatives Swelling    REACTION: Swelling of tongue and face  . Acetazolamide   . Azathioprine Other (See Comments)    Very low TPMT in deficient range Unknown  . Ezetimibe Other (See Comments)    Unknown  . Lisinopril Other (See Comments)    Unknown  . Meloxicam Other (See Comments)    Unknown  . Pravastatin Sodium Other (See Comments)    Unknown  . Simvastatin Other (See Comments)    Unknown  . Latex Hives and Rash    Review of Systems  Constitutional: Positive for malaise/fatigue. Negative for fever.  HENT: Negative for congestion.   Eyes: Positive for redness. Negative for blurred vision, double vision, photophobia, pain and discharge.  Respiratory: Negative for shortness of breath.   Cardiovascular: Negative for chest pain, palpitations and leg swelling.  Gastrointestinal: Negative for abdominal pain, blood in stool and nausea.  Genitourinary: Negative for dysuria and frequency.    Musculoskeletal: Positive for myalgias. Negative for falls.  Skin: Negative for rash.  Neurological: Negative for dizziness, loss of consciousness and headaches.  Endo/Heme/Allergies: Negative for environmental allergies.  Psychiatric/Behavioral: Negative for depression. The patient is not nervous/anxious.        Objective:    Physical Exam  Constitutional: She is oriented to person, place, and time. She appears well-developed and well-nourished. No distress.  HENT:  Head: Normocephalic and atraumatic.  Nose: Nose normal.  Eyes: Right eye exhibits no discharge. Left eye exhibits no discharge.  No significant erythema in eyes.   Neck: Normal range of motion. Neck supple.  Cardiovascular: Normal rate and regular rhythm.  No murmur heard. Pulmonary/Chest: Effort normal and breath sounds normal.  Abdominal: Soft. Bowel sounds are normal. There is no tenderness.  Musculoskeletal: She exhibits no edema.  Neurological: She is alert and oriented to person, place, and time.  Skin: Skin is warm and dry.  Psychiatric: She has a normal mood and affect.  Nursing note and vitals reviewed.   BP 118/62 (BP Location: Left Arm, Patient Position: Sitting, Cuff Size: Normal)   Pulse 86   Temp 98.3 F (36.8 C) (Oral)   Resp 18   Wt 179 lb 6.4 oz (81.4 kg)   SpO2 99%   BMI 29.40 kg/m  Wt Readings from Last 3 Encounters:  08/22/17 179 lb 6.4 oz (81.4 kg)  05/20/17 180 lb 6.4 oz (81.8 kg)  02/20/17 182 lb 12.8 oz (82.9 kg)   BP Readings from Last 3 Encounters:  08/22/17 118/62  05/20/17 (!) 100/58  02/20/17 117/75     Immunization History  Administered Date(s) Administered  . Hep A / Hep B 05/11/2013, 06/10/2013, 11/09/2013  . Influenza Split 05/25/2011, 05/29/2012  . Influenza Whole 05/28/2008  . Influenza,inj,Quad PF,6+ Mos 05/18/2013, 06/04/2014, 06/07/2015, 06/07/2016  . Pneumococcal Conjugate-13 07/27/2013  . Pneumococcal Polysaccharide-23 12/16/2003  . Td 12/16/2003  . Tdap  11/26/2013    Health Maintenance  Topic Date Due  . PNEUMOCOCCAL POLYSACCHARIDE VACCINE (2) 12/15/2008  . INFLUENZA VACCINE  03/13/2017  .  OPHTHALMOLOGY EXAM  10/17/2017  . HEMOGLOBIN A1C  10/22/2017  . FOOT EXAM  11/07/2017  . COLONOSCOPY  03/12/2018  . PAP SMEAR  03/08/2019  . MAMMOGRAM  06/04/2019  . TETANUS/TDAP  11/27/2023  . Hepatitis C Screening  Completed  . HIV Screening  Completed    Lab Results  Component Value Date   WBC 8.7 02/18/2017   HGB 11.6 (L) 02/18/2017   HCT 36.9 02/18/2017   PLT 479.0 (H) 02/18/2017   GLUCOSE 99 02/18/2017   CHOL 146 01/14/2017   TRIG 127.0 01/14/2017   HDL 35.90 (L) 01/14/2017   LDLDIRECT 95 12/22/2009   LDLCALC 85 01/14/2017   ALT 13 02/18/2017   AST 10 02/18/2017   NA 142 02/18/2017   K 4.5 02/18/2017   CL 105 02/18/2017   CREATININE 0.89 02/18/2017   BUN 14 02/18/2017   CO2 26 02/18/2017   TSH 1.61 02/18/2017   HGBA1C 6.7 04/24/2017   MICROALBUR 1.0 05/20/2017    Lab Results  Component Value Date   TSH 1.61 02/18/2017   Lab Results  Component Value Date   WBC 8.7 02/18/2017   HGB 11.6 (L) 02/18/2017   HCT 36.9 02/18/2017   MCV 76.8 (L) 02/18/2017   PLT 479.0 (H) 02/18/2017   Lab Results  Component Value Date   NA 142 02/18/2017   K 4.5 02/18/2017   CO2 26 02/18/2017   GLUCOSE 99 02/18/2017   BUN 14 02/18/2017   CREATININE 0.89 02/18/2017   BILITOT 0.3 02/18/2017   ALKPHOS 105 02/18/2017   AST 10 02/18/2017   ALT 13 02/18/2017   PROT 7.3 02/18/2017   ALBUMIN 4.2 02/18/2017   CALCIUM 9.8 02/18/2017   ANIONGAP 13.4 12/22/2009   GFR 83.19 02/18/2017   Lab Results  Component Value Date   CHOL 146 01/14/2017   Lab Results  Component Value Date   HDL 35.90 (L) 01/14/2017   Lab Results  Component Value Date   LDLCALC 85 01/14/2017   Lab Results  Component Value Date   TRIG 127.0 01/14/2017   Lab Results  Component Value Date   CHOLHDL 4 01/14/2017   Lab Results  Component Value Date    HGBA1C 6.7 04/24/2017         Assessment & Plan:   Problem List Items Addressed This Visit    Vitamin D deficiency    Check level today and maintain supplements      Relevant Orders   VITAMIN D 25 Hydroxy (Vit-D Deficiency, Fractures)   Hyperlipidemia, mixed - Primary    Encouraged heart healthy diet, increase exercise, avoid trans fats, consider a krill oil cap daily      Relevant Orders   Lipid panel   Essential hypertension    Well controlled, no changes to meds. Encouraged heart healthy diet such as the DASH diet and exercise as tolerated.       Relevant Orders   CBC   Comprehensive metabolic panel   TSH   Fibromyalgia    Had a abad flare with severe fatigue and increased generalized pain for the month between thanksgiving to Christmas is better. No obvious triggers      Dermatitis   Relevant Medications   ketoconazole (NIZORAL) 2 % cream   Diabetes mellitus (HCC)    hgba1c acceptable, minimize simple carbs. Increase exercise as tolerated.      Relevant Orders   Hemoglobin A1c   Redness of left eye    Has had a red spot in her  left eye for a couple of months,m has been seen by opthamology and no obvious cause was found. No pain or visual changes.          I have discontinued Darrold Junker. [place]ees[place]'s ibuprofen. I am also having her maintain her metFORMIN, cholecalciferol, glucose blood, RA SUNSCREEN SPF50 EX, Magnesium Oxide, diphenhydrAMINE, Lidocaine (Anorectal), PRESCRIPTION MEDICATION, NON FORMULARY, NON FORMULARY, Polyethyl Glycol-Propyl Glycol, Probiotic Product (DIGESTIVE ADVANTAGE GUMMIES PO), empagliflozin, alclomethasone, betamethasone dipropionate, OT ULTRA/FASTTK CNTRL SOLN, losartan, amLODipine, atorvastatin, omeprazole, and ketoconazole.  Meds ordered this encounter  Medications  . ketoconazole (NIZORAL) 2 % cream    Sig: APPLY EXTERNALLY TO THE AFFECTED AREA DAILY    Dispense:  60 g    Refill:  1    CMA served as scribe during this visit.  History, Physical and Plan performed by medical provider. Documentation and orders reviewed and attested to.  [name], MD

## 2017-08-22 NOTE — Assessment & Plan Note (Addendum)
Has had a red spot in her left eye for a couple of months,m has been seen by opthamology and no obvious cause was found. No pain or visual changes.

## 2017-08-22 NOTE — Assessment & Plan Note (Signed)
Encouraged heart healthy diet, increase exercise, avoid trans fats, consider a krill oil cap daily 

## 2017-08-22 NOTE — Assessment & Plan Note (Signed)
hgba1c acceptable, minimize simple carbs. Increase exercise as tolerated.  

## 2017-09-10 ENCOUNTER — Encounter: Payer: Self-pay | Admitting: Family Medicine

## 2017-10-23 ENCOUNTER — Other Ambulatory Visit: Payer: Self-pay | Admitting: Internal Medicine

## 2017-10-23 DIAGNOSIS — E042 Nontoxic multinodular goiter: Secondary | ICD-10-CM

## 2017-11-16 ENCOUNTER — Other Ambulatory Visit: Payer: Self-pay | Admitting: Family Medicine

## 2017-12-01 ENCOUNTER — Other Ambulatory Visit: Payer: Self-pay | Admitting: Family Medicine

## 2017-12-12 ENCOUNTER — Ambulatory Visit: Payer: BC Managed Care – PPO | Admitting: Podiatry

## 2017-12-12 ENCOUNTER — Ambulatory Visit (INDEPENDENT_AMBULATORY_CARE_PROVIDER_SITE_OTHER): Payer: BC Managed Care – PPO

## 2017-12-12 ENCOUNTER — Other Ambulatory Visit: Payer: Self-pay | Admitting: Podiatry

## 2017-12-12 ENCOUNTER — Encounter: Payer: Self-pay | Admitting: Podiatry

## 2017-12-12 DIAGNOSIS — M79675 Pain in left toe(s): Secondary | ICD-10-CM | POA: Diagnosis not present

## 2017-12-12 DIAGNOSIS — S99922A Unspecified injury of left foot, initial encounter: Secondary | ICD-10-CM | POA: Diagnosis not present

## 2017-12-12 NOTE — Progress Notes (Signed)
Subjective:   Patient ID: Lori Zamora, female   DOB: 62 y.o.   MRN: [id number]   HPI Patient states she traumatized the fifth digit of her left foot she is very worried that it is fractured and she does have diabetes and is not been able to wear most of her shoes   ROS      Objective:  Physical Exam  Neurovascular status intact with inflammation pain of the left fifth digit with swelling of the toe is localized in nature      Assessment:  Traumatized fifth digit left with possibility for fracture     Plan:  H&P x-ray reviewed and advised on wider shoes ice therapy and splinting if it remains tender.  This should get better eventually but will probably take time  X-ray indicates most likely this is not a fracture but there may be a small fracture of the head of the proximal phalanx which is difficult to ascertain

## 2017-12-14 ENCOUNTER — Other Ambulatory Visit: Payer: Self-pay | Admitting: Family Medicine

## 2017-12-23 ENCOUNTER — Encounter: Payer: Self-pay | Admitting: Gastroenterology

## 2018-01-20 ENCOUNTER — Encounter: Payer: Self-pay | Admitting: Family Medicine

## 2018-01-20 ENCOUNTER — Ambulatory Visit: Payer: BC Managed Care – PPO | Admitting: Family Medicine

## 2018-01-20 DIAGNOSIS — E1159 Type 2 diabetes mellitus with other circulatory complications: Secondary | ICD-10-CM | POA: Diagnosis not present

## 2018-01-20 DIAGNOSIS — I10 Essential (primary) hypertension: Secondary | ICD-10-CM

## 2018-01-20 DIAGNOSIS — K5900 Constipation, unspecified: Secondary | ICD-10-CM

## 2018-01-20 DIAGNOSIS — R700 Elevated erythrocyte sedimentation rate: Secondary | ICD-10-CM | POA: Diagnosis not present

## 2018-01-20 DIAGNOSIS — R739 Hyperglycemia, unspecified: Secondary | ICD-10-CM | POA: Diagnosis not present

## 2018-01-20 DIAGNOSIS — D509 Iron deficiency anemia, unspecified: Secondary | ICD-10-CM | POA: Diagnosis not present

## 2018-01-20 DIAGNOSIS — E782 Mixed hyperlipidemia: Secondary | ICD-10-CM | POA: Diagnosis not present

## 2018-01-20 DIAGNOSIS — R748 Abnormal levels of other serum enzymes: Secondary | ICD-10-CM

## 2018-01-20 DIAGNOSIS — Z1211 Encounter for screening for malignant neoplasm of colon: Secondary | ICD-10-CM

## 2018-01-20 HISTORY — DX: Hyperglycemia, unspecified: R73.9

## 2018-01-20 LAB — CBC
HEMATOCRIT: 38.4 % (ref 36.0–46.0)
Hemoglobin: 12.2 g/dL (ref 12.0–15.0)
MCHC: 31.7 g/dL (ref 30.0–36.0)
MCV: 76 fl — ABNORMAL LOW (ref 78.0–100.0)
Platelets: 499 10*3/uL — ABNORMAL HIGH (ref 150.0–400.0)
RBC: 5.05 Mil/uL (ref 3.87–5.11)
RDW: 18.3 % — AB (ref 11.5–15.5)
WBC: 6.6 10*3/uL (ref 4.0–10.5)

## 2018-01-20 LAB — SEDIMENTATION RATE: Sed Rate: 41 mm/hr — ABNORMAL HIGH (ref 0–30)

## 2018-01-20 NOTE — Assessment & Plan Note (Signed)
Mild, Increase leafy greens, consider increased lean red meat and using cast iron cookware. Continue to monitor, report any concerns, repeat cbc

## 2018-01-20 NOTE — Assessment & Plan Note (Signed)
Encouraged heart healthy diet, increase exercise, avoid trans fats, consider a krill oil cap daily 

## 2018-01-20 NOTE — Assessment & Plan Note (Deleted)
hgba1c acceptable at 6.9 was rechecked by endocrinology in March 6.5, minimize simple carbbs. Increase exercise as tolerated.

## 2018-01-20 NOTE — Assessment & Plan Note (Signed)
hgba1c acceptable at 6.9 was rechecked by endocrinology in March 6.5, minimize simple carbbs. Increase exercise as tolerated. She had made some dietary changes.

## 2018-01-20 NOTE — Assessment & Plan Note (Signed)
Monitor cmp 

## 2018-01-20 NOTE — Assessment & Plan Note (Signed)
Well controlled, no changes to meds. Encouraged heart healthy diet such as the DASH diet and exercise as tolerated.  °

## 2018-01-20 NOTE — Patient Instructions (Signed)
Anemia Anemia is a condition in which you do not have enough red blood cells or hemoglobin. Hemoglobin is a substance in red blood cells that carries oxygen. When you do not have enough red blood cells or hemoglobin (are anemic), your body cannot get enough oxygen and your organs may not work properly. As a result, you may feel very tired or have other problems. What are the causes? Common causes of anemia include:  Excessive bleeding. Anemia can be caused by excessive bleeding inside or outside the body, including bleeding from the intestine or from periods in women.  Poor nutrition.  Long-lasting (chronic) kidney, thyroid, and liver disease.  Bone marrow disorders.  Cancer and treatments for cancer.  HIV (human immunodeficiency virus) and AIDS (acquired immunodeficiency syndrome).  Treatments for HIV and AIDS.  Spleen problems.  Blood disorders.  Infections, medicines, and autoimmune disorders that destroy red blood cells.  What are the signs or symptoms? Symptoms of this condition include:  Minor weakness.  Dizziness.  Headache.  Feeling heartbeats that are irregular or faster than normal (palpitations).  Shortness of breath, especially with exercise.  Paleness.  Cold sensitivity.  Indigestion.  Nausea.  Difficulty sleeping.  Difficulty concentrating.  Symptoms may occur suddenly or develop slowly. If your anemia is mild, you may not have symptoms. How is this diagnosed? This condition is diagnosed based on:  Blood tests.  Your medical history.  A physical exam.  Bone marrow biopsy.  Your health care provider may also check your stool (feces) for blood and may do additional testing to look for the cause of your bleeding. You may also have other tests, including:  Imaging tests, such as a CT scan or MRI.  Endoscopy.  Colonoscopy.  How is this treated? Treatment for this condition depends on the cause. If you continue to lose a lot of blood,  you may need to be treated at a hospital. Treatment may include:  Taking supplements of iron, vitamin B12, or folic acid.  Taking a hormone medicine (erythropoietin) that can help to stimulate red blood cell growth.  Having a blood transfusion. This may be needed if you lose a lot of blood.  Making changes to your diet.  Having surgery to remove your spleen.  Follow these instructions at home:  Take over-the-counter and prescription medicines only as told by your health care provider.  Take supplements only as told by your health care provider.  Follow any diet instructions that you were given.  Keep all follow-up visits as told by your health care provider. This is important. Contact a health care provider if:  You develop new bleeding anywhere in the body. Get help right away if:  You are very weak.  You are short of breath.  You have pain in your abdomen or chest.  You are dizzy or feel faint.  You have trouble concentrating.  You have bloody or black, tarry stools.  You vomit repeatedly or you vomit up blood. Summary  Anemia is a condition in which you do not have enough red blood cells or enough of a substance in your red blood cells that carries oxygen (hemoglobin).  Symptoms may occur suddenly or develop slowly.  If your anemia is mild, you may not have symptoms.  This condition is diagnosed with blood tests as well as a medical history and physical exam. Other tests may be needed.  Treatment for this condition depends on the cause of the anemia. This information is not intended to replace advice   given to you by your health care provider. Make sure you discuss any questions you have with your health care provider. Document Released: 09/06/2004 Document Revised: 08/31/2016 Document Reviewed: 08/31/2016 Elsevier Interactive Patient Education  [employer].

## 2018-01-20 NOTE — Assessment & Plan Note (Signed)
Repeat sed rate today ?

## 2018-01-20 NOTE — Assessment & Plan Note (Signed)
Referred for colonoscopy for 10 year recall with Dr Ardis Hughs. Will request hard copy of last coloonoscopy with [facility].

## 2018-01-20 NOTE — Progress Notes (Signed)
Subjective:  I acted as a Education administrator for Dr. Charlett Blake. [place], [place]  Patient ID: Lori Zamora, female    DOB: 1956-01-29, 62 y.o.   MRN: [id number]  No chief complaint on file.   HPI  Patient is in today for 5 month follow up she is following up on her HTN, DM and other medical conditions. No recent febrile illness or acute hospitalizations. Denies CP/palp/SOB/HA/congestion/fevers/GI or GU c/o. Taking meds as prescribed. Left toe pain secondary to an ijury was evaluated by podiatry Dr Paulla Dolly and no fracture noted. Now resolved. Had a flare in fatigue and fibromyalgia but these have resolved to baseline. She also had a strange spot appear in her right eye and she saw her opthamologist Dr Izora Ribas and it was no mjor concern seems to be improving.    Patient Care Team: Mosie Lukes, MD as PCP - General (Family Medicine) Delrae Rend, MD as Consulting Physician (Endocrinology) Luberta Mutter, MD as Consulting Physician (Ophthalmology) Charlean Sanfilippo, MD as Referring Physician (Gastroenterology) Newell Coral., MD as Referring Physician (Gastroenterology)   Past Medical History:  Diagnosis Date  . Abdominal pain, unspecified site 11/29/2013  . Acquired acanthosis nigricans   . Anemia, unspecified   . 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 Bipolar 2 disorder ([place]) 01/31/2010   Qualifier: Diagnosis of  By: Fuller Plan CMA (AAMA), Terri Skains  Follows with Dr Letta Moynahan of psychiatry   . Bipolar disorder, unspecified ([place])   . CFS (chronic fatigue syndrome)   . Degeneration of intervertebral disc, site unspecified   . Depressive disorder, not elsewhere classified   . Esophageal reflux   . FH: breast cancer 05/20/2017  . Gastroparesis 06/07/2015  . Hyperglycemia 01/20/2018  . Intertriginous candidiasis 11/29/2013  . 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 Microcytic anemia 01/31/2010   Qualifier: Diagnosis of  By: Fuller Plan CMA (AAMA), [place]    . Migraine with aura, without  mention of intractable migraine without mention of status migrainosus   . Myalgia and myositis, unspecified   . Nontoxic multinodular goiter   . Other and unspecified hyperlipidemia   . Other malaise and fatigue   . Other specified iron deficiency anemias   . Other thalassemia ([place])   . Parotid gland enlargement 2011   related to connective tissue syndrome  . Pelvic floor dysfunction 01/27/2013  . Peripheral edema 02/19/2017  . Preventative health care 09/12/2014  . Renal insufficiency 01/27/2013  . Sjogren's syndrome ([place])   . Small intestinal bacterial overgrowth 11/29/2013  . Systemic lupus erythematosus ([place])   . Thrombocytosis ([place]) 06/07/2016  . Type II or unspecified type diabetes mellitus without mention of complication, not stated as uncontrolled   . Unspecified constipation   . 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 Unspecified essential hypertension   . Unspecified sleep apnea   . Unspecified vitamin D deficiency     Past Surgical History:  Procedure Laterality Date  . BIOPSY THYROID     01/13/2015, 3 previous biopsy reported all benign  . LAPAROSCOPIC TOTAL HYSTERECTOMY  2004    Family History  Problem Relation Age of Onset  . Diabetes Mother   . Heart disease Mother        family history  . Allergies Mother   . Alcohol abuse Unknown        Family history of alcoholism and addiction  .  Alcohol abuse Brother        drug abuse  . Heart disease Maternal Grandmother   . Heart disease Maternal Grandfather   . Obesity Sister   . Hypertension Unknown        family history  . Kidney disease Unknown        family history  . Stroke Unknown        1st degree relative <60    Social History   Socioeconomic History  . Marital status: Single    Spouse name: Not on file  . Number of children: 0    . Years of education: Not on file  . Highest education level: Not on file  Occupational History  . Occupation: Pharmacist, hospital  Social Needs  . Financial resource strain: Not on file  . Food insecurity:    Worry: Not on file    Inability: Not on file  . Transportation needs:    Medical: Not on file    Non-medical: Not on file  Tobacco Use  . Smoking status: Never Smoker  . Smokeless tobacco: Never Used  Substance and Sexual Activity  . Alcohol use: No  . Drug use: No  . Sexual activity: Never    Birth control/protection: None    Comment: lives alone, no major dietary restrictions, retired from teaching kindergarten  Lifestyle  . Physical activity:    Days per week: Not on file    Minutes per session: Not on file  . Stress: Not on file  Relationships  . Social connections:    Talks on phone: Not on file    Gets together: Not on file    Attends religious service: Not on file    Active member of club or organization: Not on file    Attends meetings of clubs or organizations: Not on file    Relationship status: Not on file  . Intimate partner violence:    Fear of current or ex partner: Not on file    Emotionally abused: Not on file    Physically abused: Not on file    Forced sexual activity: Not on file  Other Topics Concern  . 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 alclomethasone (ACLOVATE) 0.05 % cream Apply 1 application topically 2 (two) times daily.    Marland Kitchen amLODipine (NORVASC) 5 MG tablet TAKE 1 TABLET(5 MG) BY MOUTH TWICE DAILY 180 tablet 0  . atorvastatin (LIPITOR) 20 MG tablet TAKE 1 TABLET(20 MG) BY MOUTH THREE TIMES DAILY 270 tablet 0  . betamethasone dipropionate 0.05 % lotion Apply 1 application topically 2 (two) times daily.    . Blood Glucose Calibration (OT ULTRA/FASTTK CNTRL SOLN) SOLN Check Sugars prn  1  . cholecalciferol  (VITAMIN D) 1000 UNITS tablet Take 2,000 Units by mouth daily.     . diphenhydrAMINE (BENADRYL) 25 MG tablet Take 25 mg by mouth as needed.    . diphenhydrAMINE HCl, Sleep, 50 MG tablet Take by mouth.    . empagliflozin (JARDIANCE) 10 MG TABS tablet Take 10 mg by mouth daily.    . Glucose Blood (COOL BLOOD GLUCOSE TEST STRIPS VI)     . glucose blood test strip 1 each by Other route daily. One Touch Ultra     . ketoconazole (NIZORAL) 2 % cream APPLY EXTERNALLY TO THE AFFECTED AREA DAILY 60 g 1  .  Lidocaine, Anorectal, 5 % CREA Apply topically as needed.    Marland Kitchen losartan (COZAAR) 25 MG tablet TAKE 1 TABLET(25 MG) BY MOUTH DAILY 90 tablet 0  . Magnesium Carbonate (MAGNESIUM GLUCONATE) 54mg /38ml syringe Take by mouth.    . Magnesium Oxide 500 MG (LAX) TABS Take by mouth daily. Take 1-4 tablets daily.    . Magnesium Oxide 500 MG TABS Take by mouth.    . metFORMIN (GLUCOPHAGE) 500 MG tablet Take 1,000 mg by mouth 2 (two) times daily with a meal.     . Mouthwashes (BIOTENE/CALCIUM PBF) LIQD by Transmucosal route.    . NON FORMULARY Lubricant eye ointment- sterile mineral oil 39.9% White Petroleum 57.7%    . NON FORMULARY Biotene dry mouth oral rinse    . omeprazole (PRILOSEC) 20 MG capsule TAKE 1 CAPSULE(20 MG) BY MOUTH DAILY 90 capsule 0  . Polyethyl Glycol-Propyl Glycol (SYSTANE) 0.4-0.3 % GEL Apply to eye.    Marland Kitchen PRESCRIPTION MEDICATION prevident 5000- dry mouth toothpaste    . Probiotic Product (DIGESTIVE ADVANTAGE GUMMIES PO) Take by mouth 2 (two) times daily.    Marland Kitchen RA SUNSCREEN SPF50 EX Apply topically as needed.      . sodium fluoride (PREVIDENT) 1.1 % GEL dental gel     . lansoprazole (PREVACID) 30 MG capsule Take by mouth.     No facility-administered medications prior to visit.     Allergies  Allergen Reactions  . Penicillins Swelling and Anaphylaxis    Swelling of tongue.  . Sulfa Antibiotics Other (See Comments) and Rash    Other Reaction: swellin of tongue Other Reaction: swellin of  tongue  . Sulfonamide Derivatives Swelling    REACTION: Swelling of tongue and face  . Acetazolamide   . Azathioprine Other (See Comments)    Very low TPMT in deficient range Unknown  . Ezetimibe Other (See Comments)    Unknown  . Lisinopril Other (See Comments)    Unknown  . Meloxicam Other (See Comments)    Unknown  . Pravastatin Sodium Other (See Comments)    Unknown  . Simvastatin Other (See Comments)    Unknown  . Latex Hives and Rash    Review of Systems  Constitutional: Negative for fever and malaise/fatigue.  HENT: Negative for congestion.   Eyes: Negative for blurred vision.  Respiratory: Negative for shortness of breath.   Cardiovascular: Negative for chest pain, palpitations and leg swelling.  Gastrointestinal: Negative for abdominal pain, blood in stool and nausea.  Genitourinary: Negative for dysuria and frequency.  Musculoskeletal: Negative for falls.  Skin: Negative for rash.  Neurological: Negative for dizziness, loss of consciousness and headaches.  Endo/Heme/Allergies: Negative for environmental allergies.  Psychiatric/Behavioral: Negative for depression. The patient is not nervous/anxious.        Objective:    Physical Exam  Constitutional: She is oriented to person, place, and time. No distress.  HENT:  Head: Normocephalic and atraumatic.  Eyes: Conjunctivae are normal.  Neck: Neck supple. No thyromegaly present.  Cardiovascular: Normal rate, regular rhythm and normal heart sounds.  No murmur heard. Pulmonary/Chest: Effort normal and breath sounds normal. She has no wheezes.  Abdominal: She exhibits no distension and no mass.  Musculoskeletal: She exhibits no edema.  Lymphadenopathy:    She has no cervical adenopathy.  Neurological: She is alert and oriented to person, place, and time.  Skin: Skin is warm and dry. No rash noted. She is not diaphoretic.  Psychiatric: Judgment normal.    BP 122/60 (BP Location: Left  Arm, Patient Position:  Sitting, Cuff Size: Normal)   Pulse 84   Temp 98.3 F (36.8 C) (Oral)   Resp 18   Wt 172 lb 9.6 oz (78.3 kg)   SpO2 99%   BMI 28.29 kg/m  Wt Readings from Last 3 Encounters:  01/20/18 172 lb 9.6 oz (78.3 kg)  08/22/17 179 lb 6.4 oz (81.4 kg)  05/20/17 180 lb 6.4 oz (81.8 kg)   BP Readings from Last 3 Encounters:  01/20/18 122/60  08/22/17 118/62  05/20/17 (!) 100/58     Immunization History  Administered Date(s) Administered  . Hep A / Hep B 05/11/2013, 06/10/2013, 11/09/2013  . Influenza Split 05/25/2011, 05/29/2012  . Influenza Whole 05/28/2008  . Influenza,inj,Quad PF,6+ Mos 05/18/2013, 06/04/2014, 06/07/2015, 06/07/2016  . Pneumococcal Conjugate-13 07/27/2013  . Pneumococcal Polysaccharide-23 12/16/2003  . Td 12/16/2003  . Tdap 11/26/2013    Health Maintenance  Topic Date Due  . PNEUMOCOCCAL POLYSACCHARIDE VACCINE (2) 12/15/2008  . FOOT EXAM  11/07/2017  . HEMOGLOBIN A1C  02/19/2018  . COLONOSCOPY  03/12/2018  . INFLUENZA VACCINE  03/13/2018  . OPHTHALMOLOGY EXAM  07/30/2018  . PAP SMEAR  03/08/2019  . MAMMOGRAM  06/04/2019  . TETANUS/TDAP  11/27/2023  . Hepatitis C Screening  Completed  . HIV Screening  Completed    Lab Results  Component Value Date   WBC 6.6 08/22/2017   HGB 11.9 (L) 08/22/2017   HCT 38.3 08/22/2017   PLT 519.0 (H) 08/22/2017   GLUCOSE 99 08/22/2017   CHOL 151 08/22/2017   TRIG 102.0 08/22/2017   HDL 40.30 08/22/2017   LDLDIRECT 95 12/22/2009   LDLCALC 91 08/22/2017   ALT 14 08/22/2017   AST 12 08/22/2017   NA 140 08/22/2017   K 3.8 08/22/2017   CL 103 08/22/2017   CREATININE 0.81 08/22/2017   BUN 13 08/22/2017   CO2 28 08/22/2017   TSH 1.22 08/22/2017   HGBA1C 6.9 (H) 08/22/2017   MICROALBUR 1.0 05/20/2017    Lab Results  Component Value Date   TSH 1.22 08/22/2017   Lab Results  Component Value Date   WBC 6.6 08/22/2017   HGB 11.9 (L) 08/22/2017   HCT 38.3 08/22/2017   MCV 76.9 (L) 08/22/2017   PLT 519.0 (H)  08/22/2017   Lab Results  Component Value Date   NA 140 08/22/2017   K 3.8 08/22/2017   CO2 28 08/22/2017   GLUCOSE 99 08/22/2017   BUN 13 08/22/2017   CREATININE 0.81 08/22/2017   BILITOT 0.3 08/22/2017   ALKPHOS 100 08/22/2017   AST 12 08/22/2017   ALT 14 08/22/2017   PROT 7.7 08/22/2017   ALBUMIN 4.4 08/22/2017   CALCIUM 9.9 08/22/2017   ANIONGAP 13.4 12/22/2009   GFR 92.58 08/22/2017   Lab Results  Component Value Date   CHOL 151 08/22/2017   Lab Results  Component Value Date   HDL 40.30 08/22/2017   Lab Results  Component Value Date   LDLCALC 91 08/22/2017   Lab Results  Component Value Date   TRIG 102.0 08/22/2017   Lab Results  Component Value Date   CHOLHDL 4 08/22/2017   Lab Results  Component Value Date   HGBA1C 6.9 (H) 08/22/2017         Assessment & Plan:   Problem List Items Addressed This Visit    Hyperlipidemia, mixed    Encouraged heart healthy diet, increase exercise, avoid trans fats, consider a krill oil cap daily  Microcytic anemia    Mild, Increase leafy greens, consider increased lean red meat and using cast iron cookware. Continue to monitor, report any concerns, repeat cbc      Relevant Orders   CBC   Essential hypertension    Well controlled, no changes to meds. Encouraged heart healthy diet such as the DASH diet and exercise as tolerated.       Constipation    No c/o today      Elevated alkaline phosphatase level    Monitor cmp      Colon cancer screening    Referred for colonoscopy for 10 year recall with Dr Ardis Hughs. Will request hard copy of last coloonoscopy with [facility].       Relevant Orders   Ambulatory referral to Gastroenterology   Elevated sed rate    Repeat sed rate today      Relevant Orders   Sedimentation rate   Diabetes mellitus ([place])    hgba1c acceptable at 6.9 was rechecked by endocrinology in March 6.5, minimize simple carbbs. Increase exercise as tolerated. She had made some dietary  changes.       RESOLVED: Hyperglycemia      I have discontinued Darrold Junker. Cragin's lansoprazole. I am also having her maintain her metFORMIN, cholecalciferol, glucose blood, RA SUNSCREEN SPF50 EX, Magnesium Oxide, diphenhydrAMINE, Lidocaine (Anorectal), PRESCRIPTION MEDICATION, NON FORMULARY, NON FORMULARY, Polyethyl Glycol-Propyl Glycol, Probiotic Product (DIGESTIVE ADVANTAGE GUMMIES PO), empagliflozin, alclomethasone, betamethasone dipropionate, OT ULTRA/FASTTK CNTRL SOLN, ketoconazole, omeprazole, atorvastatin, losartan, Glucose Blood (COOL BLOOD GLUCOSE TEST STRIPS VI), magnesium gluconate, diphenhydrAMINE HCl (Sleep), Magnesium Oxide, Biotene/Calcium PBF, sodium fluoride, and amLODipine.  No orders of the defined types were placed in this encounter.   CMA served as Education administrator during this visit. History, Physical and Plan performed by medical provider. Documentation and orders reviewed and attested to.  [name], MD

## 2018-01-20 NOTE — Assessment & Plan Note (Signed)
No c/o today 

## 2018-02-16 ENCOUNTER — Other Ambulatory Visit: Payer: Self-pay | Admitting: Family Medicine

## 2018-03-01 ENCOUNTER — Other Ambulatory Visit: Payer: Self-pay | Admitting: Family Medicine

## 2018-03-06 LAB — HM COLONOSCOPY

## 2018-03-30 ENCOUNTER — Other Ambulatory Visit: Payer: Self-pay | Admitting: Family Medicine

## 2018-04-16 ENCOUNTER — Ambulatory Visit
Admission: RE | Admit: 2018-04-16 | Discharge: 2018-04-16 | Disposition: A | Discharge status: Home / Self Care | Payer: BC Managed Care – PPO | Source: Ambulatory Visit | Attending: Internal Medicine | Admitting: Internal Medicine

## 2018-04-16 DIAGNOSIS — E042 Nontoxic multinodular goiter: Secondary | ICD-10-CM

## 2018-04-30 LAB — HEMOGLOBIN A1C: Hemoglobin A1C: 6.6

## 2018-05-13 ENCOUNTER — Other Ambulatory Visit: Payer: Self-pay | Admitting: Family Medicine

## 2018-05-13 DIAGNOSIS — Z1231 Encounter for screening mammogram for malignant neoplasm of breast: Secondary | ICD-10-CM

## 2018-05-18 ENCOUNTER — Other Ambulatory Visit: Payer: Self-pay | Admitting: Family Medicine

## 2018-06-06 ENCOUNTER — Ambulatory Visit (HOSPITAL_BASED_OUTPATIENT_CLINIC_OR_DEPARTMENT_OTHER): Payer: BC Managed Care – PPO

## 2018-06-09 ENCOUNTER — Ambulatory Visit (HOSPITAL_BASED_OUTPATIENT_CLINIC_OR_DEPARTMENT_OTHER)
Admission: RE | Admit: 2018-06-09 | Discharge: 2018-06-09 | Disposition: A | Discharge status: Home / Self Care | Payer: BC Managed Care – PPO | Source: Ambulatory Visit | Attending: Family Medicine | Admitting: Family Medicine

## 2018-06-09 DIAGNOSIS — Z1231 Encounter for screening mammogram for malignant neoplasm of breast: Secondary | ICD-10-CM | POA: Diagnosis not present

## 2018-06-19 ENCOUNTER — Telehealth: Payer: Self-pay

## 2018-06-19 NOTE — Telephone Encounter (Signed)
I can get her in on a Thursday morning at 7:30 am for a CPE

## 2018-06-19 NOTE — Telephone Encounter (Signed)
Copied from [place] [id number]. Topic: Appointment Scheduling - Scheduling Inquiry for Clinic >> Jun 11, 2018  9:08 AM Scherrie Gerlach wrote: Reason for CRM: Dr Charlett Blake has cancelled the pt's CPE for 12/5.  Pt really wants to get in before the end of the year.  Only thing available for me to schedule is 12/30. Pt scheduled that day, but is requesting to come before then if ok. Please call pt to advise. Pt will be home this am

## 2018-06-19 NOTE — Telephone Encounter (Signed)
Please advise 

## 2018-06-23 NOTE — Telephone Encounter (Signed)
Pt was scheduled on Dec 12,2019 at 7:30 for cpe. Please inform provider.

## 2018-06-23 NOTE — Telephone Encounter (Signed)
thanks

## 2018-06-29 ENCOUNTER — Other Ambulatory Visit: Payer: Self-pay | Admitting: Family Medicine

## 2018-06-30 NOTE — Telephone Encounter (Signed)
Patient requested 90 day supply of the amLODipine (NORVASC) 5 MG tablet.  States that she takes this medication 2x a day and only 90 tablets were sent to the pharmacy. Please advise. [id number] - [place], [place] - [id number] AT [address]

## 2018-07-03 ENCOUNTER — Other Ambulatory Visit: Payer: Self-pay | Admitting: Family Medicine

## 2018-07-17 ENCOUNTER — Encounter: Payer: BC Managed Care – PPO | Admitting: Family Medicine

## 2018-07-24 ENCOUNTER — Ambulatory Visit (INDEPENDENT_AMBULATORY_CARE_PROVIDER_SITE_OTHER): Payer: BC Managed Care – PPO | Admitting: Family Medicine

## 2018-07-24 ENCOUNTER — Encounter: Payer: Self-pay | Admitting: Family Medicine

## 2018-07-24 VITALS — BP 102/62 | HR 92 | Temp 98.3°F | Resp 18 | Ht 65.5 in | Wt 178.4 lb

## 2018-07-24 DIAGNOSIS — R809 Proteinuria, unspecified: Secondary | ICD-10-CM | POA: Insufficient documentation

## 2018-07-24 DIAGNOSIS — Z1211 Encounter for screening for malignant neoplasm of colon: Secondary | ICD-10-CM

## 2018-07-24 DIAGNOSIS — Z0000 Encounter for general adult medical examination without abnormal findings: Secondary | ICD-10-CM

## 2018-07-24 DIAGNOSIS — I10 Essential (primary) hypertension: Secondary | ICD-10-CM | POA: Diagnosis not present

## 2018-07-24 DIAGNOSIS — E2839 Other primary ovarian failure: Secondary | ICD-10-CM

## 2018-07-24 DIAGNOSIS — E1159 Type 2 diabetes mellitus with other circulatory complications: Secondary | ICD-10-CM

## 2018-07-24 DIAGNOSIS — R700 Elevated erythrocyte sedimentation rate: Secondary | ICD-10-CM

## 2018-07-24 DIAGNOSIS — N289 Disorder of kidney and ureter, unspecified: Secondary | ICD-10-CM

## 2018-07-24 DIAGNOSIS — E782 Mixed hyperlipidemia: Secondary | ICD-10-CM | POA: Diagnosis not present

## 2018-07-24 DIAGNOSIS — E559 Vitamin D deficiency, unspecified: Secondary | ICD-10-CM | POA: Diagnosis not present

## 2018-07-24 DIAGNOSIS — Z780 Asymptomatic menopausal state: Secondary | ICD-10-CM

## 2018-07-24 DIAGNOSIS — R7989 Other specified abnormal findings of blood chemistry: Secondary | ICD-10-CM | POA: Diagnosis not present

## 2018-07-24 DIAGNOSIS — E042 Nontoxic multinodular goiter: Secondary | ICD-10-CM

## 2018-07-24 LAB — COMPREHENSIVE METABOLIC PANEL
ALBUMIN: 4.6 g/dL (ref 3.5–5.2)
ALK PHOS: 116 U/L (ref 39–117)
ALT: 16 U/L (ref 0–35)
AST: 14 U/L (ref 0–37)
BILIRUBIN TOTAL: 0.3 mg/dL (ref 0.2–1.2)
BUN: 9 mg/dL (ref 6–23)
CO2: 28 mEq/L (ref 19–32)
CREATININE: 0.77 mg/dL (ref 0.40–1.20)
Calcium: 10.2 mg/dL (ref 8.4–10.5)
Chloride: 103 mEq/L (ref 96–112)
GFR: 97.85 mL/min (ref >60.00)
Glucose, Bld: 94 mg/dL (ref 70–99)
Potassium: 4.2 mEq/L (ref 3.5–5.1)
Sodium: 142 mEq/L (ref 135–145)
Total Protein: 7.2 g/dL (ref 6.0–8.3)

## 2018-07-24 LAB — VITAMIN D 25 HYDROXY (VIT D DEFICIENCY, FRACTURES): VITD: 43.64 ng/mL (ref 30.00–100.00)

## 2018-07-24 LAB — LIPID PANEL
CHOL/HDL RATIO: 3
Cholesterol: 157 mg/dL (ref 0–200)
HDL: 45.8 mg/dL (ref >39.00)
LDL CALC: 88 mg/dL (ref 0–99)
NonHDL: 110.9
Triglycerides: 113 mg/dL (ref 0.0–149.0)
VLDL: 22.6 mg/dL (ref 0.0–40.0)

## 2018-07-24 LAB — CBC
HEMATOCRIT: 39.7 % (ref 36.0–46.0)
Hemoglobin: 12.4 g/dL (ref 12.0–15.0)
MCHC: 31.3 g/dL (ref 30.0–36.0)
MCV: 76.7 fl — ABNORMAL LOW (ref 78.0–100.0)
Platelets: 537 10*3/uL — ABNORMAL HIGH (ref 150.0–400.0)
RBC: 5.17 Mil/uL — ABNORMAL HIGH (ref 3.87–5.11)
RDW: 18.4 % — AB (ref 11.5–15.5)
WBC: 8.2 10*3/uL (ref 4.0–10.5)

## 2018-07-24 LAB — MICROALBUMIN / CREATININE URINE RATIO
Creatinine,U: 113.6 mg/dL
Microalb Creat Ratio: 1.4 mg/g (ref 0.0–30.0)
Microalb, Ur: 1.6 mg/dL (ref 0.0–1.9)

## 2018-07-24 LAB — HEMOGLOBIN A1C: Hgb A1c MFr Bld: 6.8 % — ABNORMAL HIGH (ref 4.6–6.5)

## 2018-07-24 LAB — TSH: TSH: 1.72 u[IU]/mL (ref 0.35–4.50)

## 2018-07-24 LAB — SEDIMENTATION RATE: Sed Rate: 21 mm/hr (ref 0–30)

## 2018-07-24 LAB — US THYROID: Thyroid: NORMAL

## 2018-07-24 MED ORDER — AMLODIPINE BESYLATE 5 MG PO TABS: ORAL_TABLET | ORAL | 1 refills | Status: DC

## 2018-07-24 NOTE — Progress Notes (Signed)
Subjective:    Patient ID: Lori Zamora, female    DOB: 1955/10/25, 62 y.o.   MRN: [id number]  No chief complaint on file.   HPI Patient is in today for annual preventative exam and follow up on chronic medical concerns such as hypertension, vitamin D deficiency, diabetes, gastroparesis and more. She feels well today but has her ongoing fatigue, myalgias and more. Her sister age 37 has had 4 strokes this year. The patient had a colonoscopy recently with [facility] and brings in the copies of the reports for review. No bowel changes or bloody stool. She continues to try and stay active and manage simple carbohydrates. Denies CP/palp/SOB/HA/congestion/fevers/GI or GU c/o. Taking meds as prescribed. She is managing her activities of daily living well.   Past Medical History:  Diagnosis Date  . Abdominal pain, unspecified site 11/29/2013  . Acquired acanthosis nigricans   . Anemia, unspecified   . 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 Bipolar 2 disorder ([place]) 01/31/2010   Qualifier: Diagnosis of  By: Fuller Plan CMA (AAMA), Terri Skains  Follows with Dr Letta Moynahan of psychiatry   . Bipolar disorder, unspecified ([place])   . CFS (chronic fatigue syndrome)   . Degeneration of intervertebral disc, site unspecified   . Depressive disorder, not elsewhere classified   . Esophageal reflux   . FH: breast cancer 05/20/2017  . Gastroparesis 06/07/2015  . Hyperglycemia 01/20/2018  . Intertriginous candidiasis 11/29/2013  . 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 Microcytic anemia 01/31/2010   Qualifier: Diagnosis of  By: Fuller Plan CMA (AAMA), [place]    . Migraine with aura, without mention of intractable migraine without mention of status migrainosus   . Myalgia and myositis, unspecified   . Nontoxic multinodular goiter   . Other and unspecified hyperlipidemia   . Other malaise and fatigue   . Other specified iron deficiency anemias   . Other thalassemia ([place])   . Parotid gland enlargement 2011     related to connective tissue syndrome  . Pelvic floor dysfunction 01/27/2013  . Peripheral edema 02/19/2017  . Preventative health care 09/12/2014  . Renal insufficiency 01/27/2013  . Sjogren's syndrome ([place])   . Small intestinal bacterial overgrowth 11/29/2013  . Systemic lupus erythematosus ([place])   . Thrombocytosis ([place]) 06/07/2016  . Type II or unspecified type diabetes mellitus without mention of complication, not stated as uncontrolled   . Unspecified constipation   . Unspecified diffuse connective tissue disease 01/27/2013   Follows at [facility] rheumatology, Dr Alanda Amass Per patient has tested 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 antibody  . Unspecified essential hypertension   . Unspecified sleep apnea   . Unspecified vitamin D deficiency     Past Surgical History:  Procedure Laterality Date  . BIOPSY THYROID     01/13/2015, 3 previous biopsy reported all benign  . LAPAROSCOPIC TOTAL HYSTERECTOMY  2004    Family History  Problem Relation Age of Onset  . Diabetes Mother   . Heart disease Mother        family history  . Allergies Mother   . Hypertension Mother   . Alcohol abuse Other        Family history of alcoholism and addiction  . Hypertension Father   . Other Father        viral meningitis  . Alcohol abuse Brother        drug abuse  . Hypertension Brother   . Stroke Sister  4 in 2019  . Hypertension Sister   . Cancer Sister 59       breast  . Heart disease Maternal Grandmother   . Heart disease Maternal Grandfather   . Obesity Sister   . Hypertension Other        family history  . Kidney disease Other        family history  . Stroke Other        1st degree relative <60    Social History   Socioeconomic History  . Marital status: Single    Spouse name: Not on file  . Number of children: 0  . Years of education: Not on file  . Highest education level: Not on file  Occupational History  . Occupation:  Pharmacist, hospital  Social Needs  . Financial resource strain: Not on file  . Food insecurity:    Worry: Not on file    Inability: Not on file  . Transportation needs:    Medical: Not on file    Non-medical: Not on file  Tobacco Use  . Smoking status: Never Smoker  . Smokeless tobacco: Never Used  Substance and Sexual Activity  . Alcohol use: No  . Drug use: No  . Sexual activity: Never    Birth control/protection: None    Comment: lives alone, no major dietary restrictions, retired from teaching kindergarten  Lifestyle  . Physical activity:    Days per week: Not on file    Minutes per session: Not on file  . Stress: Not on file  Relationships  . Social connections:    Talks on phone: Not on file    Gets together: Not on file    Attends religious service: Not on file    Active member of club or organization: Not on file    Attends meetings of clubs or organizations: Not on file    Relationship status: Not on file  . Intimate partner violence:    Fear of current or ex partner: Not on file    Emotionally abused: Not on file    Physically abused: Not on file    Forced sexual activity: Not on file  Other Topics Concern  . 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 alclomethasone (ACLOVATE) 0.05 % cream Apply 1 application topically 2 (two) times daily.    Marland Kitchen atorvastatin (LIPITOR) 20 MG tablet TAKE 1 TABLET(20 MG) BY MOUTH THREE TIMES DAILY 270 tablet 1  . betamethasone dipropionate 0.05 % lotion Apply 1 application topically 2 (two) times daily.    . Blood Glucose Calibration (OT ULTRA/FASTTK CNTRL SOLN) SOLN Check Sugars prn  1  . cholecalciferol (VITAMIN D) 1000 UNITS tablet Take 2,000 Units by mouth daily.     . diphenhydrAMINE (BENADRYL) 25 MG tablet Take 25 mg by mouth as needed.    . diphenhydrAMINE HCl, Sleep, 50 MG tablet Take by mouth.    .  empagliflozin (JARDIANCE) 10 MG TABS tablet Take 10 mg by mouth daily.    Marland Kitchen glucose blood test strip 1 each by Other route daily. One Touch Ultra     . ketoconazole (NIZORAL) 2 % cream APPLY EXTERNALLY TO THE AFFECTED AREA DAILY 60 g 1  . Lidocaine, Anorectal, 5 % CREA Apply topically as needed.    Marland Kitchen losartan (COZAAR) 25 MG tablet TAKE 1 TABLET(25 MG) BY  MOUTH DAILY 90 tablet 1  . Magnesium Carbonate (MAGNESIUM GLUCONATE) 54mg /61ml syringe Take by mouth.    . Magnesium Oxide 500 MG (LAX) TABS Take by mouth daily. Take 1-4 tablets daily.    . Magnesium Oxide 500 MG TABS Take by mouth.    . metFORMIN (GLUCOPHAGE) 500 MG tablet Take 1,000 mg by mouth 2 (two) times daily with a meal.     . Mouthwashes (BIOTENE/CALCIUM PBF) LIQD by Transmucosal route.    . NON FORMULARY Lubricant eye ointment- sterile mineral oil 39.9% White Petroleum 57.7%    . NON FORMULARY Biotene dry mouth oral rinse    . omeprazole (PRILOSEC) 20 MG capsule TAKE 1 CAPSULE(20 MG) BY MOUTH DAILY 90 capsule 0  . Polyethyl Glycol-Propyl Glycol (SYSTANE) 0.4-0.3 % GEL Apply to eye.    Marland Kitchen PRESCRIPTION MEDICATION prevident 5000- dry mouth toothpaste    . Probiotic Product (DIGESTIVE ADVANTAGE GUMMIES PO) Take by mouth 2 (two) times daily.    Marland Kitchen RA SUNSCREEN SPF50 EX Apply topically as needed.      . sodium fluoride (PREVIDENT) 1.1 % GEL dental gel     . amLODipine (NORVASC) 5 MG tablet TAKE 1 TABLET(5 MG) BY MOUTH TWICE DAILY 90 tablet 0  . Glucose Blood (COOL BLOOD GLUCOSE TEST STRIPS VI)      No facility-administered medications prior to visit.     Allergies  Allergen Reactions  . Penicillins Swelling and Anaphylaxis    Swelling of tongue.  . Sulfa Antibiotics Other (See Comments) and Rash    Other Reaction: swellin of tongue Other Reaction: swellin of tongue  . Sulfonamide Derivatives Swelling    REACTION: Swelling of tongue and face  . Acetazolamide   . Azathioprine Other (See Comments)    Very low TPMT in deficient  range Unknown  . Ezetimibe Other (See Comments)    Unknown  . Lisinopril Other (See Comments)    Unknown  . Meloxicam Other (See Comments)    Unknown  . Pravastatin Sodium Other (See Comments)    Unknown  . Simvastatin Other (See Comments)    Unknown  . Latex Hives and Rash    Review of Systems  Constitutional: Positive for malaise/fatigue. Negative for chills and fever.  HENT: Negative for congestion and hearing loss.   Eyes: Negative for discharge.  Respiratory: Negative for cough, sputum production and shortness of breath.   Cardiovascular: Negative for chest pain, palpitations and leg swelling.  Gastrointestinal: Negative for abdominal pain, blood in stool, constipation, diarrhea, heartburn, nausea and vomiting.  Genitourinary: Negative for dysuria, frequency, hematuria and urgency.  Musculoskeletal: Positive for myalgias. Negative for back pain and falls.  Skin: Negative for rash.  Neurological: Negative for dizziness, sensory change, loss of consciousness, weakness and headaches.  Endo/Heme/Allergies: Negative for environmental allergies. Does not bruise/bleed easily.  Psychiatric/Behavioral: Negative for depression and suicidal ideas. The patient is not nervous/anxious and does not have insomnia.        Objective:    Physical Exam Constitutional:      General: She is not in acute distress.    Appearance: She is well-developed.  HENT:     Head: Normocephalic and atraumatic.  Eyes:     Conjunctiva/sclera: Conjunctivae normal.  Neck:     Musculoskeletal: Neck supple.     Thyroid: No thyromegaly.  Cardiovascular:     Rate and Rhythm: Normal rate and regular rhythm.     Heart sounds: Normal heart sounds. No murmur.  Pulmonary:     Effort:  Pulmonary effort is normal. No respiratory distress.     Breath sounds: Normal breath sounds.  Abdominal:     General: Bowel sounds are normal. There is no distension.     Palpations: Abdomen is soft. There is no mass.      Tenderness: There is no abdominal tenderness.  Lymphadenopathy:     Cervical: No cervical adenopathy.  Skin:    General: Skin is warm and dry.  Neurological:     Mental Status: She is alert and oriented to person, place, and time.  Psychiatric:        Behavior: Behavior normal.     BP 102/62 (BP Location: Left Arm, Patient Position: Sitting, Cuff Size: Normal)   Pulse 92   Temp 98.3 F (36.8 C) (Oral)   Resp 18   Ht 5' 5.5" (1.664 m)   Wt 178 lb 6.4 oz (80.9 kg)   BMI 29.24 kg/m  Wt Readings from Last 3 Encounters:  07/24/18 178 lb 6.4 oz (80.9 kg)  01/20/18 172 lb 9.6 oz (78.3 kg)  08/22/17 179 lb 6.4 oz (81.4 kg)     Lab Results  Component Value Date   WBC 6.6 01/20/2018   HGB 12.2 01/20/2018   HCT 38.4 01/20/2018   PLT 499.0 (H) 01/20/2018   GLUCOSE 99 08/22/2017   CHOL 151 08/22/2017   TRIG 102.0 08/22/2017   HDL 40.30 08/22/2017   LDLDIRECT 95 12/22/2009   LDLCALC 91 08/22/2017   ALT 14 08/22/2017   AST 12 08/22/2017   NA 140 08/22/2017   K 3.8 08/22/2017   CL 103 08/22/2017   CREATININE 0.81 08/22/2017   BUN 13 08/22/2017   CO2 28 08/22/2017   TSH 1.22 08/22/2017   HGBA1C 6.9 (H) 08/22/2017   MICROALBUR 1.0 05/20/2017    Lab Results  Component Value Date   TSH 1.22 08/22/2017   Lab Results  Component Value Date   WBC 6.6 01/20/2018   HGB 12.2 01/20/2018   HCT 38.4 01/20/2018   MCV 76.0 (L) 01/20/2018   PLT 499.0 (H) 01/20/2018   Lab Results  Component Value Date   NA 140 08/22/2017   K 3.8 08/22/2017   CO2 28 08/22/2017   GLUCOSE 99 08/22/2017   BUN 13 08/22/2017   CREATININE 0.81 08/22/2017   BILITOT 0.3 08/22/2017   ALKPHOS 100 08/22/2017   AST 12 08/22/2017   ALT 14 08/22/2017   PROT 7.7 08/22/2017   ALBUMIN 4.4 08/22/2017   CALCIUM 9.9 08/22/2017   ANIONGAP 13.4 12/22/2009   GFR 92.58 08/22/2017   Lab Results  Component Value Date   CHOL 151 08/22/2017   Lab Results  Component Value Date   HDL 40.30 08/22/2017   Lab  Results  Component Value Date   LDLCALC 91 08/22/2017   Lab Results  Component Value Date   TRIG 102.0 08/22/2017   Lab Results  Component Value Date   CHOLHDL 4 08/22/2017   Lab Results  Component Value Date   HGBA1C 6.9 (H) 08/22/2017       Assessment & Plan:   Problem List Items Addressed This Visit    Vitamin D deficiency    Supplement and monitor      Hyperlipidemia, mixed    Encouraged heart healthy diet, increase exercise, avoid trans fats, consider a krill oil cap daily      Relevant Medications   amLODipine (NORVASC) 5 MG tablet   Essential hypertension    Well controlled, no changes to meds. Encouraged  heart healthy diet such as the DASH diet and exercise as tolerated.       Relevant Medications   amLODipine (NORVASC) 5 MG tablet   Nontoxic multinodular goiter    Recent thyroid ultrasound was performed a nodule is enlarging.       Renal insufficiency    Monitor cmp and hydrate well.      Colon cancer screening    Follows with [facility] for her ongoingconditions and just had her colonoscopy at [facility] Dr Willene Hatchet.       Diabetes mellitus (HCC)    hgba1c acceptable, minimize simple carbs. Increase exercise as tolerated. Continue current meds      Preventative health care    Patient encouraged to maintain heart healthy diet, regular exercise, adequate sleep. Consider daily probiotics. Take medications as prescribed      Proteinuria    Recheck urine microalbumin         I have discontinued Darrold Junker. [place]zone's Glucose Blood (COOL BLOOD GLUCOSE TEST STRIPS VI). I am also having her maintain her metFORMIN, cholecalciferol, glucose blood, RA SUNSCREEN SPF50 EX, Magnesium Oxide, diphenhydrAMINE, Lidocaine (Anorectal), PRESCRIPTION MEDICATION, NON FORMULARY, NON FORMULARY, Polyethyl Glycol-Propyl Glycol, Probiotic Product (DIGESTIVE ADVANTAGE GUMMIES PO), empagliflozin, alclomethasone, betamethasone dipropionate, OT ULTRA/FASTTK CNTRL SOLN,  ketoconazole, magnesium gluconate, diphenhydrAMINE HCl (Sleep), Magnesium Oxide, Biotene/Calcium PBF, sodium fluoride, losartan, atorvastatin, omeprazole, and amLODipine.  Meds ordered this encounter  Medications  . amLODipine (NORVASC) 5 MG tablet    Sig: TAKE 1 TABLET(5 MG) BY MOUTH TWICE DAILY    Dispense:  180 tablet    Refill:  1     [name], MD

## 2018-07-24 NOTE — Assessment & Plan Note (Signed)
Supplement and monitor 

## 2018-07-24 NOTE — Assessment & Plan Note (Signed)
Check level 

## 2018-07-24 NOTE — Patient Instructions (Addendum)
Shingrix is the new shingles shot, 2 shots over 2-6 months at [place], Female Preventive care refers to lifestyle choices and visits with your health care provider that can promote health and wellness. What does preventive care include?  A yearly physical exam. This is also called an annual well check.  Dental exams once or twice a year.  Routine eye exams. Ask your health care provider how often you should have your eyes checked.  Personal lifestyle choices, including: ? Daily care of your teeth and gums. ? Regular physical activity. ? Eating a healthy diet. ? Avoiding tobacco and drug use. ? Limiting alcohol use. ? Practicing safe sex. ? Taking low-dose aspirin daily starting at age 67. ? Taking vitamin and mineral supplements as recommended by your health care provider. What happens during an annual well check? The services and screenings done by your health care provider during your annual well check will depend on your age, overall health, lifestyle risk factors, and family history of disease. Counseling Your health care provider may ask you questions about your:  Alcohol use.  Tobacco use.  Drug use.  Emotional well-being.  Home and relationship well-being.  Sexual activity.  Eating habits.  Work and work Statistician.  Method of birth control.  Menstrual cycle.  Pregnancy history.  Screening You may have the following tests or measurements:  Height, weight, and BMI.  Blood pressure.  Lipid and cholesterol levels. These may be checked every 5 years, or more frequently if you are over 77 years old.  Skin check.  Lung cancer screening. You may have this screening every year starting at age 40 if you have a 30-pack-year history of smoking and currently smoke or have quit within the past 15 years.  Fecal occult blood test (FOBT) of the stool. You may have this test every year starting at age 69.  Flexible sigmoidoscopy or  colonoscopy. You may have a sigmoidoscopy every 5 years or a colonoscopy every 10 years starting at age 29.  Hepatitis C blood test.  Hepatitis B blood test.  Sexually transmitted disease (STD) testing.  Diabetes screening. This is done by checking your blood sugar (glucose) after you have not eaten for a while (fasting). You may have this done every 1-3 years.  Mammogram. This may be done every 1-2 years. Talk to your health care provider about when you should start having regular mammograms. This may depend on whether you have a family history of breast cancer.  BRCA-related cancer screening. This may be done if you have a family history of breast, ovarian, tubal, or peritoneal cancers.  Pelvic exam and Pap test. This may be done every 3 years starting at age 17. Starting at age 35, this may be done every 5 years if you have a Pap test in combination with an HPV test.  Bone density scan. This is done to screen for osteoporosis. You may have this scan if you are at high risk for osteoporosis.  Discuss your test results, treatment options, and if necessary, the need for more tests with your health care provider. Vaccines Your health care provider may recommend certain vaccines, such as:  Influenza vaccine. This is recommended every year.  Tetanus, diphtheria, and acellular pertussis (Tdap, Td) vaccine. You may need a Td booster every 10 years.  Varicella vaccine. You may need this if you have not been vaccinated.  Zoster vaccine. You may need this after age 57.  Measles, mumps, and rubella (MMR) vaccine. You  may need at least one dose of MMR if you were born in 1957 or later. You may also need a second dose.  Pneumococcal 13-valent conjugate (PCV13) vaccine. You may need this if you have certain conditions and were not previously vaccinated.  Pneumococcal polysaccharide (PPSV23) vaccine. You may need one or two doses if you smoke cigarettes or if you have certain  conditions.  Meningococcal vaccine. You may need this if you have certain conditions.  Hepatitis A vaccine. You may need this if you have certain conditions or if you travel or work in places where you may be exposed to hepatitis A.  Hepatitis B vaccine. You may need this if you have certain conditions or if you travel or work in places where you may be exposed to hepatitis B.  Haemophilus influenzae type b (Hib) vaccine. You may need this if you have certain conditions.  Talk to your health care provider about which screenings and vaccines you need and how often you need them. This information is not intended to replace advice given to you by your health care provider. Make sure you discuss any questions you have with your health care provider. Document Released: 08/26/2015 Document Revised: 04/18/2016 Document Reviewed: 05/31/2015 Elsevier Interactive Patient Education  [employer].

## 2018-07-24 NOTE — Assessment & Plan Note (Signed)
Recheck urine microalbumin

## 2018-07-24 NOTE — Assessment & Plan Note (Signed)
Encouraged heart healthy diet, increase exercise, avoid trans fats, consider a krill oil cap daily 

## 2018-07-24 NOTE — Assessment & Plan Note (Signed)
Monitor cmp and hydrate well.

## 2018-07-24 NOTE — Assessment & Plan Note (Signed)
Well controlled, no changes to meds. Encouraged heart healthy diet such as the DASH diet and exercise as tolerated.  °

## 2018-07-24 NOTE — Assessment & Plan Note (Signed)
Patient encouraged to maintain heart healthy diet, regular exercise, adequate sleep. Consider daily probiotics. Take medications as prescribed 

## 2018-07-24 NOTE — Assessment & Plan Note (Signed)
Recent thyroid ultrasound was performed a nodule is enlarging.

## 2018-07-24 NOTE — Assessment & Plan Note (Signed)
hgba1c acceptable, minimize simple carbs. Increase exercise as tolerated. Continue current meds 

## 2018-07-24 NOTE — Assessment & Plan Note (Addendum)
Follows with [facility] for her ongoingconditions and just had her colonoscopy at [facility] Dr Willene Hatchet.

## 2018-07-25 NOTE — Addendum Note (Signed)
Addended by: Magdalene Molly A on: 07/25/2018 04:36 PM   Modules accepted: Orders

## 2018-07-29 LAB — HM DIABETES EYE EXAM

## 2018-07-31 ENCOUNTER — Telehealth: Payer: Self-pay | Admitting: *Deleted

## 2018-07-31 ENCOUNTER — Telehealth: Payer: Self-pay | Admitting: Family

## 2018-07-31 NOTE — Telephone Encounter (Signed)
lmom for pt to return call to office re new patient appt. Mailed appt letter for 08/22/18 at [place] am

## 2018-07-31 NOTE — Telephone Encounter (Signed)
Received Diabetic Eye Exam Report from [facility]; forwarded to provider/SLS 12/19

## 2018-08-01 ENCOUNTER — Encounter: Payer: Self-pay | Admitting: Family Medicine

## 2018-08-01 ENCOUNTER — Ambulatory Visit (HOSPITAL_BASED_OUTPATIENT_CLINIC_OR_DEPARTMENT_OTHER)
Admission: RE | Admit: 2018-08-01 | Discharge: 2018-08-01 | Disposition: A | Discharge status: Home / Self Care | Payer: BC Managed Care – PPO | Source: Ambulatory Visit | Attending: Family Medicine | Admitting: Family Medicine

## 2018-08-01 ENCOUNTER — Telehealth: Payer: Self-pay | Admitting: Family Medicine

## 2018-08-01 DIAGNOSIS — Z780 Asymptomatic menopausal state: Secondary | ICD-10-CM | POA: Insufficient documentation

## 2018-08-01 DIAGNOSIS — E2839 Other primary ovarian failure: Secondary | ICD-10-CM | POA: Insufficient documentation

## 2018-08-01 NOTE — Telephone Encounter (Signed)
Copied from [place] [id number]. Topic: Quick Communication - See Telephone Encounter >> Aug 01, 2018  9:27 AM Rosalin Hawking wrote: CRM for notification. See Telephone encounter for: 08/01/18.   Pt dropped off document for provider to see and have on pt's chart [facility] - 1 page) Document put at front office tray under providers name.

## 2018-08-03 ENCOUNTER — Encounter: Payer: Self-pay | Admitting: Family Medicine

## 2018-08-04 MED ORDER — AMLODIPINE BESYLATE 5 MG PO TABS: ORAL_TABLET | ORAL | 1 refills | Status: DC

## 2018-08-04 NOTE — Telephone Encounter (Signed)
Forwarded to provider/SLS 12/23

## 2018-08-11 ENCOUNTER — Encounter: Payer: BC Managed Care – PPO | Admitting: Family Medicine

## 2018-08-12 ENCOUNTER — Encounter: Payer: Self-pay | Admitting: Family Medicine

## 2018-08-20 ENCOUNTER — Other Ambulatory Visit: Payer: Self-pay | Admitting: Family Medicine

## 2018-08-20 MED ORDER — LOSARTAN POTASSIUM 25 MG PO TABS: ORAL_TABLET | ORAL | 1 refills | Status: DC

## 2018-08-20 MED ORDER — OMEPRAZOLE 20 MG PO CPDR: DELAYED_RELEASE_CAPSULE | ORAL | 1 refills | Status: DC

## 2018-08-20 MED ORDER — ATORVASTATIN CALCIUM 20 MG PO TABS: ORAL_TABLET | ORAL | 1 refills | Status: DC

## 2018-08-20 MED ORDER — ATORVASTATIN CALCIUM 20 MG PO TABS: ORAL_TABLET | ORAL | 0 refills | Status: DC

## 2018-08-20 NOTE — Telephone Encounter (Signed)
Called the patient on both numbers left a detailed message that all 3 medications are being sent in today for refils.  Apologized for the delay in getting her refills. All 3 sent in to her pharmacy

## 2018-08-20 NOTE — Telephone Encounter (Signed)
Pt came in office stating is needing for today refill on omeprazole (PRILOSEC) 20 MG capsule for 90 days since pt went to pharmacy in August 11, 2018 requesting her refill and still pt is waiting for her refill. Pt stated went to pharmacy twice and still they did not received her meds and that she is completely out (send rx to [facility] that is on file). Pt states also is needing refill on atorvastatin (LIPITOR) 20 MG tablet and losartan (COZAAR) 25 MG tablet. Please advise ASAP.   PT tel [phone] or [phone]

## 2018-08-21 ENCOUNTER — Other Ambulatory Visit: Payer: Self-pay | Admitting: Family

## 2018-08-21 DIAGNOSIS — D75839 Thrombocytosis, unspecified: Secondary | ICD-10-CM

## 2018-08-21 DIAGNOSIS — D473 Essential (hemorrhagic) thrombocythemia: Secondary | ICD-10-CM

## 2018-08-21 DIAGNOSIS — D509 Iron deficiency anemia, unspecified: Secondary | ICD-10-CM

## 2018-08-22 ENCOUNTER — Inpatient Hospital Stay: Payer: BC Managed Care – PPO | Attending: Family | Admitting: Family

## 2018-08-22 ENCOUNTER — Encounter: Payer: Self-pay | Admitting: Family

## 2018-08-22 ENCOUNTER — Other Ambulatory Visit: Payer: Self-pay

## 2018-08-22 ENCOUNTER — Inpatient Hospital Stay: Payer: BC Managed Care – PPO

## 2018-08-22 VITALS — BP 118/62 | HR 83 | Temp 98.4°F | Resp 19 | Ht 65.5 in | Wt 179.2 lb

## 2018-08-22 DIAGNOSIS — D560 Alpha thalassemia: Secondary | ICD-10-CM | POA: Diagnosis not present

## 2018-08-22 DIAGNOSIS — E119 Type 2 diabetes mellitus without complications: Secondary | ICD-10-CM | POA: Diagnosis not present

## 2018-08-22 DIAGNOSIS — D473 Essential (hemorrhagic) thrombocythemia: Secondary | ICD-10-CM

## 2018-08-22 DIAGNOSIS — D75839 Thrombocytosis, unspecified: Secondary | ICD-10-CM

## 2018-08-22 DIAGNOSIS — Z803 Family history of malignant neoplasm of breast: Secondary | ICD-10-CM | POA: Diagnosis not present

## 2018-08-22 DIAGNOSIS — D509 Iron deficiency anemia, unspecified: Secondary | ICD-10-CM | POA: Insufficient documentation

## 2018-08-22 DIAGNOSIS — D568 Other thalassemias: Secondary | ICD-10-CM

## 2018-08-22 DIAGNOSIS — M3500 Sicca syndrome, unspecified: Secondary | ICD-10-CM | POA: Insufficient documentation

## 2018-08-22 LAB — CBC WITH DIFFERENTIAL (CANCER CENTER ONLY)
Abs Immature Granulocytes: 0.03 10*3/uL (ref 0.00–0.07)
BASOS ABS: 0 10*3/uL (ref 0.0–0.1)
Basophils Relative: 1 %
Eosinophils Absolute: 0.1 10*3/uL (ref 0.0–0.5)
Eosinophils Relative: 1 %
HEMATOCRIT: 40.6 % (ref 36.0–46.0)
Hemoglobin: 11.8 g/dL — ABNORMAL LOW (ref 12.0–15.0)
Immature Granulocytes: 0 %
Lymphocytes Relative: 29 %
Lymphs Abs: 2 10*3/uL (ref 0.7–4.0)
MCH: 23.4 pg — ABNORMAL LOW (ref 26.0–34.0)
MCHC: 29.1 g/dL — ABNORMAL LOW (ref 30.0–36.0)
MCV: 80.6 fL (ref 80.0–100.0)
Monocytes Absolute: 0.4 10*3/uL (ref 0.1–1.0)
Monocytes Relative: 6 %
Neutro Abs: 4.5 10*3/uL (ref 1.7–7.7)
Neutrophils Relative %: 63 %
Platelet Count: 495 10*3/uL — ABNORMAL HIGH (ref 150–400)
RBC: 5.04 MIL/uL (ref 3.87–5.11)
RDW: 18.6 % — AB (ref 11.5–15.5)
WBC Count: 7 10*3/uL (ref 4.0–10.5)
nRBC: 0 % (ref 0.0–0.2)

## 2018-08-22 LAB — CMP (CANCER CENTER ONLY)
ALBUMIN: 4.4 g/dL (ref 3.5–5.0)
ALT: 17 U/L (ref 0–44)
AST: 13 U/L — ABNORMAL LOW (ref 15–41)
Alkaline Phosphatase: 113 U/L (ref 38–126)
Anion gap: 10 (ref 5–15)
BUN: 12 mg/dL (ref 8–23)
CO2: 28 mmol/L (ref 22–32)
Calcium: 9.5 mg/dL (ref 8.9–10.3)
Chloride: 105 mmol/L (ref 98–111)
Creatinine: 0.81 mg/dL (ref 0.44–1.00)
GFR, Est AFR Am: >60 mL/min (ref >60)
GFR, Estimated: >60 mL/min (ref >60)
Glucose, Bld: 103 mg/dL — ABNORMAL HIGH (ref 70–99)
Potassium: 4.1 mmol/L (ref 3.5–5.1)
Sodium: 143 mmol/L (ref 135–145)
Total Bilirubin: 0.3 mg/dL (ref 0.3–1.2)
Total Protein: 7.4 g/dL (ref 6.5–8.1)

## 2018-08-22 LAB — FERRITIN: Ferritin: 6 ng/mL — ABNORMAL LOW (ref 11–307)

## 2018-08-22 LAB — IRON AND TIBC
Iron: 44 ug/dL (ref 41–142)
Saturation Ratios: 11 % — ABNORMAL LOW (ref 21–57)
TIBC: 405 ug/dL (ref 236–444)
UIBC: 361 ug/dL (ref 120–384)

## 2018-08-22 LAB — RETICULOCYTES
Immature Retic Fract: 16.5 % — ABNORMAL HIGH (ref 2.3–15.9)
RBC.: 5.04 MIL/uL (ref 3.87–5.11)
RETIC COUNT ABSOLUTE: 54.9 10*3/uL (ref 19.0–186.0)
Retic Ct Pct: 1.1 % (ref 0.4–3.1)

## 2018-08-22 LAB — LACTATE DEHYDROGENASE: LDH: 225 U/L — ABNORMAL HIGH (ref 98–192)

## 2018-08-22 LAB — PLATELET BY CITRATE

## 2018-08-22 LAB — SAVE SMEAR (SSMR)

## 2018-08-22 MED ORDER — FOLIC ACID 1 MG PO TABS: 1.0000 mg | ORAL_TABLET | Freq: Every day | ORAL | 3 refills | Status: DC

## 2018-08-22 NOTE — Progress Notes (Signed)
Hematology/Oncology Consultation   Name: Lori Zamora      MRN: [id number]    Location: Room/bed info not found  Date: 08/22/2018 Time:8:50 AM   REFERRING PHYSICIAN: [name], MD  REASON FOR CONSULT: Increased platelet count   DIAGNOSIS: Thrombocytosis   HISTORY OF PRESENT ILLNESS: Lori Zamora is a very pleasant 63 yo African American Lori with an elevated platelet count. She has history of iron deficiency anemia and alpha thalassemia.  She was seen by doctor Ennever over 10 years ago for iron deficiency and received IV iron after failing oral iron. She states that she also failed IV iron and does not want an infusion again.  She states that she bruises easily and is symptomatic with fatigue and chills.  She is not currently on folic acid.  She denies any episodes of bleeding, no petechiae.  Her mother also had alpha thalassemia.  She states that her spleen is still in.  No personal history of cancer. Her sister has history of breast cancer.  She has multiple autoimmune issues including Lupus, Sjogren's syndrome and diabetes.  She was recently treated with antibiotics for gingivitis associated with the Sjogren's and dry mouth. 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  She has fibromyalgia along with chronic fatigue syndrome.  She has a mild visible tremor.  No fever, n/v, cough, rash, dizziness, SOB, chest pain, palpitations, abdominal pain or changes in bowel or bladder habits.  She has gastroparesis and sees GI at [facility]. She takes magnesium to help with chronic constipation.  She has maintained a good appetite and is staying well hydrated. Her weight is stable.  She does not smoke or drink alcoholic beverages.  She has puffiness in her feet and ankles that waxes and wanes. She wears compression stockings daily.   ROS: All other 10 point review of systems is negative.   PAST MEDICAL HISTORY:   Past Medical History:  Diagnosis Date  . Abdominal pain, unspecified site  11/29/2013  . Acquired acanthosis nigricans   . Anemia, unspecified   . 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 Bipolar 2 disorder ([place]) 01/31/2010   Qualifier: Diagnosis of  By: Fuller Plan CMA (AAMA), Terri Skains  Follows with Dr Letta Moynahan of psychiatry   . Bipolar disorder, unspecified ([place])   . CFS (chronic fatigue syndrome)   . Degeneration of intervertebral disc, site unspecified   . Depressive disorder, not elsewhere classified   . Esophageal reflux   . FH: breast cancer 05/20/2017  . Gastroparesis 06/07/2015  . Hyperglycemia 01/20/2018  . Intertriginous candidiasis 11/29/2013  . 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 Microcytic anemia 01/31/2010   Qualifier: Diagnosis of  By: Fuller Plan CMA (AAMA), [place]    . Migraine with aura, without mention of intractable migraine without mention of status migrainosus   . Myalgia and myositis, unspecified   . Nontoxic multinodular goiter   . Other and unspecified hyperlipidemia   . Other malaise and fatigue   . Other specified iron deficiency anemias   . Other thalassemia ([place])   . Parotid gland enlargement 2011   related to connective tissue syndrome  . Pelvic floor dysfunction 01/27/2013  . Peripheral edema 02/19/2017  . Preventative health care 09/12/2014  . Renal insufficiency 01/27/2013  . Sjogren's syndrome ([place])   . Small intestinal bacterial overgrowth 11/29/2013  . Systemic lupus erythematosus ([place])   . Thrombocytosis ([place]) 06/07/2016  . Type II or unspecified type diabetes mellitus without mention of complication, not stated as uncontrolled   . Unspecified constipation   .  Unspecified diffuse connective tissue disease 01/27/2013   Follows at [facility] rheumatology, Dr Alanda Amass Per patient has tested 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 antibody  . Unspecified essential hypertension   . Unspecified sleep apnea   . Unspecified vitamin D deficiency     ALLERGIES: Allergies  Allergen  Reactions  . Penicillins Swelling and Anaphylaxis    Swelling of tongue.  . Sulfa Antibiotics Other (See Comments) and Rash    Other Reaction: swellin of tongue Other Reaction: swellin of tongue  . Sulfonamide Derivatives Swelling    REACTION: Swelling of tongue and face  . Acetazolamide   . Azathioprine Other (See Comments)    Very low TPMT in deficient range Unknown  . Ezetimibe Other (See Comments)    Unknown  . Lisinopril Other (See Comments)    Unknown  . Meloxicam Other (See Comments)    Unknown  . Pravastatin Sodium Other (See Comments)    Unknown  . Simvastatin Other (See Comments)    Unknown  . Latex Hives and Rash      MEDICATIONS:  Current Outpatient Medications on File Prior to Visit  Medication Sig Dispense Refill  . alclomethasone (ACLOVATE) 0.05 % cream Apply 1 application topically 2 (two) times daily.    Marland Kitchen amLODipine (NORVASC) 5 MG tablet TAKE 1 TABLET(5 MG) BY MOUTH TWICE DAILY 180 tablet 1  . atorvastatin (LIPITOR) 20 MG tablet Take 3 capsules daily by mouth 270 tablet 0  . betamethasone dipropionate 0.05 % lotion Apply 1 application topically 2 (two) times daily.    . Blood Glucose Calibration (OT ULTRA/FASTTK CNTRL SOLN) SOLN Check Sugars prn  1  . cholecalciferol (VITAMIN D) 1000 UNITS tablet Take 2,000 Units by mouth daily.     . diphenhydrAMINE (BENADRYL) 25 MG tablet Take 25 mg by mouth as needed.    . diphenhydrAMINE HCl, Sleep, 50 MG tablet Take by mouth.    . empagliflozin (JARDIANCE) 10 MG TABS tablet Take 10 mg by mouth daily.    Marland Kitchen glucose blood test strip 1 each by Other route daily. One Touch Ultra     . ketoconazole (NIZORAL) 2 % cream APPLY EXTERNALLY TO THE AFFECTED AREA DAILY 60 g 1  . Lidocaine, Anorectal, 5 % CREA Apply topically as needed.    Marland Kitchen losartan (COZAAR) 25 MG tablet TAKE 1 TABLET(25 MG) BY MOUTH DAILY 90 tablet 1  . Magnesium Carbonate (MAGNESIUM GLUCONATE) 54mg /66ml syringe Take by mouth.    . Magnesium Oxide 500 MG (LAX) TABS  Take by mouth daily. Take 1-4 tablets daily.    . Magnesium Oxide 500 MG (LAX) TABS Take by mouth.    . Magnesium Oxide 500 MG TABS Take by mouth.    . metFORMIN (GLUCOPHAGE) 500 MG tablet Take 1,000 mg by mouth 2 (two) times daily with a meal.     . Mouthwashes (BIOTENE/CALCIUM PBF) LIQD by Transmucosal route.    . NON FORMULARY Lubricant eye ointment- sterile mineral oil 39.9% White Petroleum 57.7%    . NON FORMULARY Biotene dry mouth oral rinse    . omeprazole (PRILOSEC) 20 MG capsule Take 1 capsule daily by mouth 90 capsule 1  . Polyethyl Glycol-Propyl Glycol (SYSTANE) 0.4-0.3 % GEL Apply to eye.    Marland Kitchen PRESCRIPTION MEDICATION prevident 5000- dry mouth toothpaste    . Probiotic Product (DIGESTIVE ADVANTAGE GUMMIES PO) Take by mouth 2 (two) times daily.    Marland Kitchen RA SUNSCREEN SPF50 EX Apply topically as needed.      Marland Kitchen  sodium fluoride (PREVIDENT) 1.1 % GEL dental gel      No current facility-administered medications on file prior to visit.      PAST SURGICAL HISTORY Past Surgical History:  Procedure Laterality Date  . BIOPSY THYROID     01/13/2015, 3 previous biopsy reported all benign  . LAPAROSCOPIC TOTAL HYSTERECTOMY  2004    FAMILY HISTORY: Family History  Problem Relation Age of Onset  . Diabetes Mother   . Heart disease Mother        family history  . Allergies Mother   . Hypertension Mother   . Alcohol abuse Other        Family history of alcoholism and addiction  . Hypertension Father   . Other Father        viral meningitis  . Alcohol abuse Brother        drug abuse  . Hypertension Brother   . Stroke Sister        4 in 2019  . Hypertension Sister   . Cancer Sister 32       breast  . Heart disease Maternal Grandmother   . Heart disease Maternal Grandfather   . Obesity Sister   . Hypertension Other        family history  . Kidney disease Other        family history  . Stroke Other        1st degree relative <60    SOCIAL HISTORY:  reports that she has never  smoked. She has never used smokeless tobacco. She reports that she does not drink alcohol or use drugs.  PERFORMANCE STATUS: The patient's performance status is 1 - Symptomatic but completely ambulatory  PHYSICAL EXAM: Most Recent Vital Signs: Blood pressure 118/62, pulse 83, temperature 98.4 F (36.9 C), temperature source Oral, resp. rate 19, height 5' 5.5" (1.664 m), weight 179 lb 4 oz (81.3 kg), SpO2 95 %. BP 118/62 (BP Location: Left Arm, Patient Position: Sitting)   Pulse 83   Temp 98.4 F (36.9 C) (Oral)   Resp 19   Ht 5' 5.5" (1.664 m)   Wt 179 lb 4 oz (81.3 kg)   SpO2 95%   BMI 29.38 kg/m   General Appearance:    Alert, cooperative, no distress, appears stated age  Head:    Normocephalic, without obvious abnormality, atraumatic  Eyes:    PERRL, conjunctiva/corneas clear, EOM's intact, fundi    benign, both eyes        Throat:   Lips, mucosa, and tongue normal; teeth and gums normal  Neck:   Supple, symmetrical, trachea midline, no adenopathy;    thyroid:  no enlargement/tenderness/nodules; no carotid   bruit or JVD  Back:     Symmetric, no curvature, ROM normal, no CVA tenderness  Lungs:     Clear to auscultation bilaterally, respirations unlabored  Chest Wall:    No tenderness or deformity   Heart:    Regular rate and rhythm, S1 and S2 normal, no murmur, rub   or gallop     Abdomen:     Soft, non-tender, bowel sounds active all four quadrants,    no masses, no organomegaly        Extremities:   Extremities normal, atraumatic, no cyanosis or edema  Pulses:   2+ and symmetric all extremities  Skin:   Skin color, texture, turgor normal, no rashes or lesions  Lymph nodes:   Cervical, supraclavicular, and axillary nodes normal  Neurologic:   CNII-XII  intact, normal strength, sensation and reflexes    throughout    LABORATORY DATA:  Results for orders placed or performed in visit on 08/22/18 (from the past 48 hour(s))  CBC with Differential ([place] Only)      Status: Abnormal   Collection Time: 08/22/18  8:31 AM  Result Value Ref Range   WBC Count 7.0 4.0 - 10.5 K/uL   RBC 5.04 3.87 - 5.11 MIL/uL   Hemoglobin 11.8 (L) 12.0 - 15.0 g/dL   HCT 40.6 36.0 - 46.0 %   MCV 80.6 80.0 - 100.0 fL   MCH 23.4 (L) 26.0 - 34.0 pg   MCHC 29.1 (L) 30.0 - 36.0 g/dL   RDW 18.6 (H) 11.5 - 15.5 %   Platelet Count 495 (H) 150 - 400 K/uL   nRBC 0.0 0.0 - 0.2 %   Neutrophils Relative % 63 %   Neutro Abs 4.5 1.7 - 7.7 K/uL   Lymphocytes Relative 29 %   Lymphs Abs 2.0 0.7 - 4.0 K/uL   Monocytes Relative 6 %   Monocytes Absolute 0.4 0.1 - 1.0 K/uL   Eosinophils Relative 1 %   Eosinophils Absolute 0.1 0.0 - 0.5 K/uL   Basophils Relative 1 %   Basophils Absolute 0.0 0.0 - 0.1 K/uL   Immature Granulocytes 0 %   Abs Immature Granulocytes 0.03 0.00 - 0.07 K/uL    Comment: Performed at [facility] Lab at [facility], [address] [place], [place] [address]  Reticulocytes     Status: Abnormal   Collection Time: 08/22/18  8:31 AM  Result Value Ref Range   Retic Ct Pct 1.1 0.4 - 3.1 %   RBC. 5.04 3.87 - 5.11 MIL/uL   Retic Count, Absolute 54.9 19.0 - 186.0 K/uL   Immature Retic Fract 16.5 (H) 2.3 - 15.9 %    Comment: Performed at [facility] Lab at [facility], [address], [place], [place] [address]  Save Smear [facility])     Status: None   Collection Time: 08/22/18  8:31 AM  Result Value Ref Range   Smear Review SMEAR STAINED AND AVAILABLE FOR REVIEW     Comment: Performed at [facility] Lab at [facility], [address] [place], [place] [address]  Platelet by [place]     Status: None   Collection Time: 08/22/18  8:31 AM  Result Value Ref Range   Platelet CT in [place] CONSISTENT WITH PLT     Comment: Performed at [facility] Lab at [facility], [address] [place], [place] [address]      RADIOGRAPHY: No results found.     PATHOLOGY:  None  ASSESSMENT/PLAN: Lori Zamora is a very pleasant 63 yo African American Lori with an elevated platelet count likely associated with her iron deficiency anemia. She also has alpha thalassemia.  She does not want any IV iron as she states she failed both that and oral iron in the past.  We will go ahead and have her start folic acid 1 mg PO daily.  We will see what her labs show and plan to see her back in another 4 months.   All questions were answered and she is on agreement with the plan. She will contact our office with any questions or concerns. We can certainly see the patient much sooner if necessary.  She was discussed with and also seen by Dr.  Ennever and he is in agreement with the aforementioned.   Laverna Peace, FNP-BC     Addendum: I saw and examined Lori Zamora with Judson Roch.  I agree with the above assessment.  I have to believe that the thrombocytosis is strictly from her iron deficiency.  She is clearly iron deficient.  Her ferritin is only 6 with an iron saturation of 11%.  I looked at her blood smear.  She has target cells.  I did not see any nucleated red blood cells.  There was no immature myeloid or lymphoid cells.  I saw no sickle cells.  I am sure that she has thalassemia.  We will make sure she takes folic acid.  She does not want IV iron.  She is failed oral iron.  As such, I am not sure that anything that we can do to help with the iron deficiency.  I just do not see that she has an underlying myeloproliferative disorder.  I cannot find a palpable spleen on her examination.  For right now, she is totally asymptomatic.  We will plan to get her back in 4 months.  I think the real key is going to be the iron deficiency.  I think if she would take IV iron, I am sure that her thrombocytosis will improve.  We spent about 40 minutes with her.  We answered her questions.  All the time spent face-to-face.  We counseled her and help coordinate her follow-up  appointments.   Lattie Haw, MD

## 2018-10-24 ENCOUNTER — Ambulatory Visit: Payer: BC Managed Care – PPO | Admitting: Family Medicine

## 2018-10-27 ENCOUNTER — Other Ambulatory Visit: Payer: Self-pay

## 2018-10-27 ENCOUNTER — Telehealth: Payer: Self-pay

## 2018-10-27 MED ORDER — LOSARTAN POTASSIUM 25 MG PO TABS: ORAL_TABLET | ORAL | 1 refills | Status: DC

## 2018-10-27 MED ORDER — AMLODIPINE BESYLATE 5 MG PO TABS: ORAL_TABLET | ORAL | 1 refills | Status: DC

## 2018-10-27 MED ORDER — ATORVASTATIN CALCIUM 20 MG PO TABS: ORAL_TABLET | ORAL | 1 refills | Status: DC

## 2018-10-27 NOTE — Telephone Encounter (Signed)
Copied from [place] [phone]. Topic: General - Inquiry >> Oct 27, 2018 11:16 AM Rutherford Nail, NT wrote: Reason for CRM: Patient calling and states that she has an appointment tomorrow 10/28/2018 for 3 months follow up for medication refills and she is NOT sick. Would like to know if this appointment is necessary with everything going on or if her medications could be sent without her coming to this appointment? Please advise. CB#: [phone]

## 2018-10-27 NOTE — Telephone Encounter (Signed)
Rescheduled patients appointment  Medications sent in

## 2018-10-27 NOTE — Telephone Encounter (Signed)
OK for her to reschedule her for a couple of months

## 2018-10-28 ENCOUNTER — Ambulatory Visit: Payer: BC Managed Care – PPO | Admitting: Family Medicine

## 2018-12-28 ENCOUNTER — Encounter: Payer: Self-pay | Admitting: Family

## 2018-12-31 ENCOUNTER — Ambulatory Visit: Payer: BC Managed Care – PPO | Admitting: Family

## 2018-12-31 ENCOUNTER — Other Ambulatory Visit: Payer: BC Managed Care – PPO

## 2019-01-26 ENCOUNTER — Other Ambulatory Visit: Payer: Self-pay | Admitting: Family Medicine

## 2019-02-20 ENCOUNTER — Ambulatory Visit: Payer: BC Managed Care – PPO | Admitting: Family Medicine

## 2019-02-26 ENCOUNTER — Ambulatory Visit (INDEPENDENT_AMBULATORY_CARE_PROVIDER_SITE_OTHER): Payer: BC Managed Care – PPO | Admitting: Family Medicine

## 2019-02-26 ENCOUNTER — Other Ambulatory Visit: Payer: Self-pay

## 2019-02-26 DIAGNOSIS — E1159 Type 2 diabetes mellitus with other circulatory complications: Secondary | ICD-10-CM | POA: Diagnosis not present

## 2019-02-26 DIAGNOSIS — L309 Dermatitis, unspecified: Secondary | ICD-10-CM

## 2019-02-26 DIAGNOSIS — I10 Essential (primary) hypertension: Secondary | ICD-10-CM | POA: Diagnosis not present

## 2019-02-26 DIAGNOSIS — K3184 Gastroparesis: Secondary | ICD-10-CM

## 2019-02-26 MED ORDER — FOLIC ACID 1 MG PO TABS: 1.0000 mg | ORAL_TABLET | Freq: Every day | ORAL | Status: DC

## 2019-02-26 MED ORDER — KETOCONAZOLE 2 % EX CREA: TOPICAL_CREAM | CUTANEOUS | 2 refills | Status: DC

## 2019-02-26 MED ORDER — VITAMIN C 1000 MG PO TABS: 1000.0000 mg | ORAL_TABLET | Freq: Every day | ORAL | Status: AC

## 2019-03-01 NOTE — Assessment & Plan Note (Signed)
Requests a refill on Ketoconazole to use prn

## 2019-03-01 NOTE — Progress Notes (Signed)
Virtual Visit via video Note  I connected with Lori Zamora on 02/26/19 at  5:00 PM EDT by a video enabled telemedicine application and verified that I am speaking with the correct person using two identifiers.  Location: Patient: home Provider: home   I discussed the limitations of evaluation and management by telemedicine and the availability of in person appointments. The patient expressed understanding and agreed to proceed.  Magdalene Zamora, CMA was able to get patient set up on a video visit    Subjective:    Patient ID: Lori Zamora, female    DOB: [birth year], 63 y.o.   MRN: [id number]  No chief complaint on file.   HPI Patient is in today for follow up on chronic medical concerns including hyperlipidemia, gastroparesis, vitamin D deficiency and more. She continues to follow with Dr Buddy Duty of endocrinology but they have agreed to have her hgba1c checked at our lab with other labwork in the current pandemic world. No recent febrile illness or hospitalizations. Denies CP/palp/SOB/HA/congestion/fevers or GU c/o. Taking meds as prescribed  Past Medical History:  Diagnosis Date  . Abdominal pain, unspecified site 11/29/2013  . Acquired acanthosis nigricans   . Anemia, unspecified   . 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 Bipolar 2 disorder ([place]) 01/31/2010   Qualifier: Diagnosis of  By: Fuller Plan CMA (AAMA), Terri Skains  Follows with Dr Letta Moynahan of psychiatry   . Bipolar disorder, unspecified ([place])   . CFS (chronic fatigue syndrome)   . Degeneration of intervertebral disc, site unspecified   . Depressive disorder, not elsewhere classified   . Esophageal reflux   . FH: breast cancer 05/20/2017  . Gastroparesis 06/07/2015  . Hyperglycemia 01/20/2018  . Intertriginous candidiasis 11/29/2013  . 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 Microcytic anemia 01/31/2010   Qualifier: Diagnosis of  By: Fuller Plan CMA (AAMA), [place]    . Migraine with aura, without mention of  intractable migraine without mention of status migrainosus   . Myalgia and myositis, unspecified   . Nontoxic multinodular goiter   . Other and unspecified hyperlipidemia   . Other malaise and fatigue   . Other specified iron deficiency anemias   . Other thalassemia ([place])   . Parotid gland enlargement 2011   related to connective tissue syndrome  . Pelvic floor dysfunction 01/27/2013  . Peripheral edema 02/19/2017  . Preventative health care 09/12/2014  . Renal insufficiency 01/27/2013  . Sjogren's syndrome ([place])   . Small intestinal bacterial overgrowth 11/29/2013  . Systemic lupus erythematosus ([place])   . Thrombocytosis ([place]) 06/07/2016  . Type II or unspecified type diabetes mellitus without mention of complication, not stated as uncontrolled   . Unspecified constipation   . 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 Unspecified essential hypertension   . Unspecified sleep apnea   . Unspecified vitamin D deficiency     Past Surgical History:  Procedure Laterality Date  . BIOPSY THYROID     01/13/2015, 3 previous biopsy reported all benign  . LAPAROSCOPIC TOTAL HYSTERECTOMY  2004    Family History  Problem Relation Age of Onset  . Diabetes Mother   . Heart disease Mother        family history  . Allergies Mother   . Hypertension Mother   . Alcohol abuse Other        Family history of alcoholism  and addiction  . Hypertension Father   . Other Father        viral meningitis  . Alcohol abuse Brother        drug abuse  . Hypertension Brother   . Stroke Sister        4 in 2019  . Hypertension Sister   . Cancer Sister 79       breast  . Heart disease Maternal Grandmother   . Heart disease Maternal Grandfather   . Obesity Sister   . Hypertension Other        family history  . Kidney disease Other         family history  . Stroke Other        1st degree relative <60    Social History   Socioeconomic History  . Marital status: Single    Spouse name: Not on file  . Number of children: 0  . Years of education: Not on file  . Highest education level: Not on file  Occupational History  . Occupation: Pharmacist, hospital  Social Needs  . Financial resource strain: Not on file  . Food insecurity    Worry: Not on file    Inability: Not on file  . Transportation needs    Medical: Not on file    Non-medical: Not on file  Tobacco Use  . Smoking status: Never Smoker  . Smokeless tobacco: Never Used  Substance and Sexual Activity  . Alcohol use: No  . Drug use: No  . Sexual activity: Never    Birth control/protection: None    Comment: lives alone, no major dietary restrictions, retired from teaching kindergarten  Lifestyle  . Physical activity    Days per week: Not on file    Minutes per session: Not on file  . Stress: Not on file  Relationships  . Social Herbalist on phone: Not on file    Gets together: Not on file    Attends religious service: Not on file    Active member of club or organization: Not on file    Attends meetings of clubs or organizations: Not on file    Relationship status: Not on file  . Intimate partner violence    Fear of current or ex partner: Not on file    Emotionally abused: Not on file    Physically abused: Not on file    Forced sexual activity: Not on file  Other Topics Concern  . 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 alclomethasone (ACLOVATE) 0.05 % cream Apply 1 application topically 2 (two) times daily.    Marland Kitchen amLODipine (NORVASC) 5 MG tablet TAKE 1 TABLET(5 MG) BY MOUTH TWICE DAILY 180 tablet 1  . atorvastatin (LIPITOR) 20 MG tablet Take 3 capsules daily by mouth 270 tablet 1  . betamethasone dipropionate 0.05  % lotion Apply 1 application topically 2 (two) times daily.    . Blood Glucose Calibration (OT ULTRA/FASTTK CNTRL SOLN) SOLN Check Sugars prn  1  . cholecalciferol (VITAMIN D) 1000 UNITS tablet Take 2,000 Units by mouth daily.     . diphenhydrAMINE (BENADRYL) 25 MG tablet Take 25 mg by mouth as needed.    . diphenhydrAMINE HCl, Sleep, 50 MG tablet Take by mouth.    . empagliflozin (JARDIANCE) 10 MG TABS tablet Take 10 mg  by mouth daily.    . folic acid (FOLVITE) 1 MG tablet Take 1 tablet (1 mg total) by mouth daily. 90 tablet 3  . glucose blood test strip 1 each by Other route daily. One Touch Ultra     . Lidocaine, Anorectal, 5 % CREA Apply topically as needed.    Marland Kitchen losartan (COZAAR) 25 MG tablet TAKE 1 TABLET(25 MG) BY MOUTH DAILY 90 tablet 1  . Magnesium Carbonate (MAGNESIUM GLUCONATE) 54mg /70ml syringe Take by mouth.    . Magnesium Oxide 500 MG (LAX) TABS Take by mouth daily. Take 1-4 tablets daily.    . Magnesium Oxide 500 MG (LAX) TABS Take by mouth.    . Magnesium Oxide 500 MG TABS Take by mouth.    . metFORMIN (GLUCOPHAGE) 500 MG tablet Take 1,000 mg by mouth 2 (two) times daily with a meal.     . Mouthwashes (BIOTENE/CALCIUM PBF) LIQD by Transmucosal route.    . NON FORMULARY Lubricant eye ointment- sterile mineral oil 39.9% White Petroleum 57.7%    . NON FORMULARY Biotene dry mouth oral rinse    . omeprazole (PRILOSEC) 20 MG capsule TAKE 1 CAPSULE BY MOUTH DAILY 90 capsule 1  . Polyethyl Glycol-Propyl Glycol (SYSTANE) 0.4-0.3 % GEL Apply to eye.    Marland Kitchen PRESCRIPTION MEDICATION prevident 5000- dry mouth toothpaste    . Probiotic Product (DIGESTIVE ADVANTAGE GUMMIES PO) Take by mouth 2 (two) times daily.    Marland Kitchen RA SUNSCREEN SPF50 EX Apply topically as needed.      . sodium fluoride (PREVIDENT) 1.1 % GEL dental gel     . ketoconazole (NIZORAL) 2 % cream APPLY EXTERNALLY TO THE AFFECTED AREA DAILY 60 g 1   No facility-administered medications prior to visit.     Allergies  Allergen  Reactions  . Penicillins Swelling and Anaphylaxis    Swelling of tongue.  . Sulfa Antibiotics Other (See Comments) and Rash    Other Reaction: swellin of tongue Other Reaction: swellin of tongue  . Sulfonamide Derivatives Swelling    REACTION: Swelling of tongue and face  . Acetazolamide   . Azathioprine Other (See Comments)    Very low TPMT in deficient range Unknown  . Ezetimibe Other (See Comments)    Unknown  . Lisinopril Other (See Comments)    Unknown  . Meloxicam Other (See Comments)    Unknown  . Pravastatin Sodium Other (See Comments)    Unknown  . Simvastatin Other (See Comments)    Unknown  . Latex Hives and Rash    Review of Systems  Constitutional: Positive for malaise/fatigue. Negative for fever.  HENT: Negative for congestion.   Eyes: Negative for blurred vision.  Respiratory: Negative for shortness of breath.   Cardiovascular: Negative for chest pain, palpitations and leg swelling.  Gastrointestinal: Negative for abdominal pain, blood in stool and nausea.  Genitourinary: Negative for dysuria and frequency.  Musculoskeletal: Positive for joint pain and myalgias. Negative for falls.  Skin: Positive for rash.  Neurological: Negative for dizziness, loss of consciousness and headaches.  Endo/Heme/Allergies: Negative for environmental allergies.  Psychiatric/Behavioral: Negative for depression. The patient is not nervous/anxious.        Objective:    Physical Exam Constitutional:      Appearance: Normal appearance. She is not ill-appearing.  HENT:     Head: Normocephalic and atraumatic.  Eyes:     General:        Right eye: No discharge.        Left eye:  No discharge.  Pulmonary:     Effort: Pulmonary effort is normal.  Neurological:     Mental Status: She is alert and oriented to person, place, and time.  Psychiatric:        Mood and Affect: Mood normal.        Behavior: Behavior normal.     There were no vitals taken for this visit. Wt  Readings from Last 3 Encounters:  08/22/18 179 lb 4 oz (81.3 kg)  07/24/18 178 lb 6.4 oz (80.9 kg)  01/20/18 172 lb 9.6 oz (78.3 kg)    Diabetic Foot Exam - Simple   No data filed     Lab Results  Component Value Date   WBC 7.0 08/22/2018   HGB 11.8 (L) 08/22/2018   HCT 40.6 08/22/2018   PLT 495 (H) 08/22/2018   GLUCOSE 103 (H) 08/22/2018   CHOL 157 07/24/2018   TRIG 113.0 07/24/2018   HDL 45.80 07/24/2018   LDLDIRECT 95 12/22/2009   LDLCALC 88 07/24/2018   ALT 17 08/22/2018   AST 13 (L) 08/22/2018   NA 143 08/22/2018   K 4.1 08/22/2018   CL 105 08/22/2018   CREATININE 0.81 08/22/2018   BUN 12 08/22/2018   CO2 28 08/22/2018   TSH 1.72 07/24/2018   HGBA1C 6.8 (H) 07/24/2018   MICROALBUR 1.6 07/24/2018    Lab Results  Component Value Date   TSH 1.72 07/24/2018   Lab Results  Component Value Date   WBC 7.0 08/22/2018   HGB 11.8 (L) 08/22/2018   HCT 40.6 08/22/2018   MCV 80.6 08/22/2018   PLT 495 (H) 08/22/2018   Lab Results  Component Value Date   NA 143 08/22/2018   K 4.1 08/22/2018   CO2 28 08/22/2018   GLUCOSE 103 (H) 08/22/2018   BUN 12 08/22/2018   CREATININE 0.81 08/22/2018   BILITOT 0.3 08/22/2018   ALKPHOS 113 08/22/2018   AST 13 (L) 08/22/2018   ALT 17 08/22/2018   PROT 7.4 08/22/2018   ALBUMIN 4.4 08/22/2018   CALCIUM 9.5 08/22/2018   ANIONGAP 10 08/22/2018   GFR 97.85 07/24/2018   Lab Results  Component Value Date   CHOL 157 07/24/2018   Lab Results  Component Value Date   HDL 45.80 07/24/2018   Lab Results  Component Value Date   LDLCALC 88 07/24/2018   Lab Results  Component Value Date   TRIG 113.0 07/24/2018   Lab Results  Component Value Date   CHOLHDL 3 07/24/2018   Lab Results  Component Value Date   HGBA1C 6.8 (H) 07/24/2018       Assessment & Plan:   Problem List Items Addressed This Visit    Essential hypertension    Encouraged to check vitals weekly and prn, no changes to meds. Encouraged heart healthy  diet such as the DASH diet and exercise as tolerated.       Dermatitis    Requests a refill on Ketoconazole to use prn      Relevant Medications   ketoconazole (NIZORAL) 2 % cream   Gastroparesis    Continues to follow with gastroenterology at [facility]      Diabetes mellitus ([place])    hgba1c acceptable, minimize simple carbs. Increase exercise as tolerated. Continue current meds, follows with endocrinology but agrees to have her labs done with [place] to minimize trips to the lab. Will forward results to Dr Buddy Duty         I am having Darrold Junker. Lovena Le  start on vitamin C and folic acid. I am also having her maintain her metFORMIN, cholecalciferol, glucose blood, RA SUNSCREEN SPF50 EX, Magnesium Oxide, diphenhydrAMINE, Lidocaine (Anorectal), PRESCRIPTION MEDICATION, NON FORMULARY, NON FORMULARY, Polyethyl Glycol-Propyl Glycol, Probiotic Product (DIGESTIVE ADVANTAGE GUMMIES PO), empagliflozin, alclomethasone, betamethasone dipropionate, OT ULTRA/FASTTK CNTRL SOLN, magnesium gluconate, diphenhydrAMINE HCl (Sleep), Magnesium Oxide, Biotene/Calcium PBF, sodium fluoride, Magnesium Oxide, folic acid, atorvastatin, amLODipine, losartan, omeprazole, and ketoconazole.  Meds ordered this encounter  Medications  . ketoconazole (NIZORAL) 2 % cream    Sig: APPLY EXTERNALLY TO THE AFFECTED AREA DAILY    Dispense:  60 g    Refill:  2  . Ascorbic Acid (VITAMIN C) 1000 MG tablet    Sig: Take 1 tablet (1,000 mg total) by mouth daily.    Dispense:     . folic acid (FOLVITE) 1 MG tablet    Sig: Take 1 tablet (1 mg total) by mouth daily.    Dispense:        I discussed the assessment and treatment plan with the patient. The patient was provided an opportunity to ask questions and all were answered. The patient agreed with the plan and demonstrated an understanding of the instructions.   The patient was advised to call back or seek an in-person evaluation if the symptoms worsen or if the condition fails to improve  as anticipated.  I provided 25 minutes of non-face-to-face time during this encounter.   [name], MD

## 2019-03-01 NOTE — Assessment & Plan Note (Signed)
hgba1c acceptable, minimize simple carbs. Increase exercise as tolerated. Continue current meds, follows with endocrinology but agrees to have her labs done with [place] to minimize trips to the lab. Will forward results to Dr Buddy Duty

## 2019-03-01 NOTE — Assessment & Plan Note (Signed)
Encouraged to check vitals weekly and prn, no changes to meds. Encouraged heart healthy diet such as the DASH diet and exercise as tolerated.

## 2019-03-01 NOTE — Assessment & Plan Note (Signed)
Continues to follow with gastroenterology at [facility]

## 2019-04-27 ENCOUNTER — Other Ambulatory Visit: Payer: Self-pay | Admitting: Family Medicine

## 2019-04-29 ENCOUNTER — Other Ambulatory Visit: Payer: Self-pay | Admitting: Internal Medicine

## 2019-04-29 DIAGNOSIS — E042 Nontoxic multinodular goiter: Secondary | ICD-10-CM

## 2019-05-11 ENCOUNTER — Ambulatory Visit
Admission: RE | Admit: 2019-05-11 | Discharge: 2019-05-11 | Disposition: A | Discharge status: Home / Self Care | Payer: BC Managed Care – PPO | Source: Ambulatory Visit | Attending: Internal Medicine | Admitting: Internal Medicine

## 2019-05-11 DIAGNOSIS — E042 Nontoxic multinodular goiter: Secondary | ICD-10-CM

## 2019-07-27 ENCOUNTER — Other Ambulatory Visit: Payer: Self-pay | Admitting: Family Medicine

## 2019-08-27 ENCOUNTER — Other Ambulatory Visit: Payer: Self-pay | Admitting: Family

## 2019-08-27 DIAGNOSIS — D568 Other thalassemias: Secondary | ICD-10-CM

## 2019-10-14 ENCOUNTER — Other Ambulatory Visit: Payer: Self-pay | Admitting: Family Medicine

## 2019-10-14 ENCOUNTER — Encounter: Payer: Self-pay | Admitting: Family Medicine

## 2019-10-14 DIAGNOSIS — R739 Hyperglycemia, unspecified: Secondary | ICD-10-CM

## 2019-10-14 DIAGNOSIS — E782 Mixed hyperlipidemia: Secondary | ICD-10-CM

## 2019-10-14 DIAGNOSIS — D508 Other iron deficiency anemias: Secondary | ICD-10-CM

## 2019-10-14 DIAGNOSIS — I10 Essential (primary) hypertension: Secondary | ICD-10-CM

## 2019-10-14 DIAGNOSIS — E559 Vitamin D deficiency, unspecified: Secondary | ICD-10-CM

## 2019-10-16 ENCOUNTER — Other Ambulatory Visit: Payer: Self-pay

## 2019-10-16 ENCOUNTER — Encounter: Payer: Self-pay | Admitting: Family Medicine

## 2019-10-16 ENCOUNTER — Ambulatory Visit (INDEPENDENT_AMBULATORY_CARE_PROVIDER_SITE_OTHER): Payer: BC Managed Care – PPO | Admitting: Family Medicine

## 2019-10-16 DIAGNOSIS — D508 Other iron deficiency anemias: Secondary | ICD-10-CM | POA: Diagnosis not present

## 2019-10-16 DIAGNOSIS — E782 Mixed hyperlipidemia: Secondary | ICD-10-CM

## 2019-10-16 DIAGNOSIS — N289 Disorder of kidney and ureter, unspecified: Secondary | ICD-10-CM

## 2019-10-16 DIAGNOSIS — E559 Vitamin D deficiency, unspecified: Secondary | ICD-10-CM

## 2019-10-16 DIAGNOSIS — I10 Essential (primary) hypertension: Secondary | ICD-10-CM

## 2019-10-16 DIAGNOSIS — R739 Hyperglycemia, unspecified: Secondary | ICD-10-CM

## 2019-10-16 DIAGNOSIS — L309 Dermatitis, unspecified: Secondary | ICD-10-CM

## 2019-10-16 LAB — COMPREHENSIVE METABOLIC PANEL
ALT: 15 U/L (ref 0–35)
AST: 13 U/L (ref 0–37)
Albumin: 4.4 g/dL (ref 3.5–5.2)
Alkaline Phosphatase: 129 U/L — ABNORMAL HIGH (ref 39–117)
BUN: 11 mg/dL (ref 6–23)
CO2: 26 mEq/L (ref 19–32)
Calcium: 9.9 mg/dL (ref 8.4–10.5)
Chloride: 103 mEq/L (ref 96–112)
Creatinine, Ser: 0.74 mg/dL (ref 0.40–1.20)
GFR: 96 mL/min (ref >60.00)
Glucose, Bld: 91 mg/dL (ref 70–99)
Potassium: 4.2 mEq/L (ref 3.5–5.1)
Sodium: 140 mEq/L (ref 135–145)
Total Bilirubin: 0.4 mg/dL (ref 0.2–1.2)
Total Protein: 7.2 g/dL (ref 6.0–8.3)

## 2019-10-16 LAB — LIPID PANEL
Cholesterol: 147 mg/dL (ref 0–200)
HDL: 42.2 mg/dL (ref >39.00)
LDL Cholesterol: 83 mg/dL (ref 0–99)
NonHDL: 105.03
Total CHOL/HDL Ratio: 3
Triglycerides: 111 mg/dL (ref 0.0–149.0)
VLDL: 22.2 mg/dL (ref 0.0–40.0)

## 2019-10-16 LAB — CBC
HCT: 40.2 % (ref 36.0–46.0)
Hemoglobin: 12.5 g/dL (ref 12.0–15.0)
MCHC: 31 g/dL (ref 30.0–36.0)
MCV: 76.4 fl — ABNORMAL LOW (ref 78.0–100.0)
Platelets: 497 10*3/uL — ABNORMAL HIGH (ref 150.0–400.0)
RBC: 5.26 Mil/uL — ABNORMAL HIGH (ref 3.87–5.11)
RDW: 18.3 % — ABNORMAL HIGH (ref 11.5–15.5)
WBC: 8.3 10*3/uL (ref 4.0–10.5)

## 2019-10-16 LAB — MICROALBUMIN / CREATININE URINE RATIO
Creatinine,U: 91 mg/dL
Microalb Creat Ratio: 1.8 mg/g (ref 0.0–30.0)
Microalb, Ur: 1.6 mg/dL (ref 0.0–1.9)

## 2019-10-16 LAB — HEMOGLOBIN A1C: Hgb A1c MFr Bld: 6.8 % — ABNORMAL HIGH (ref 4.6–6.5)

## 2019-10-16 LAB — TSH: TSH: 1.57 u[IU]/mL (ref 0.35–4.50)

## 2019-10-16 LAB — FOLATE: Folate: >23.7 ng/mL (ref >5.9)

## 2019-10-16 MED ORDER — ASPIRIN EC 81 MG PO TBEC: 81.0000 mg | DELAYED_RELEASE_TABLET | Freq: Every day | ORAL | Status: AC

## 2019-10-16 MED ORDER — FOLIC ACID 1 MG PO TABS: 1.0000 mg | ORAL_TABLET | Freq: Every day | ORAL | 3 refills | Status: DC

## 2019-10-16 NOTE — Assessment & Plan Note (Signed)
Supplement and monitor 

## 2019-10-16 NOTE — Assessment & Plan Note (Signed)
Had a bad reaction to a detergent and was itching all over without a rash. She stopped the detergent and most of the itching has improved. Some still on her head and behind her ears but much less. Encouraged Sarna anti itch lotion, if worsens again consider Zyrtec and Pepcid bid.

## 2019-10-16 NOTE — Assessment & Plan Note (Signed)
Encouraged heart healthy diet, increase exercise, avoid trans fats, consider a krill oil cap daily 

## 2019-10-16 NOTE — Assessment & Plan Note (Signed)
Hydrate and monitor 

## 2019-10-16 NOTE — Progress Notes (Signed)
Virtual Visit via Video Note  I connected with Lori Zamora on 10/16/19 at  9:00 AM EST by a video enabled telemedicine application and verified that I am speaking with the correct person using two identifiers.  Location: Patient: home Provider: homg   I discussed the limitations of evaluation and management by telemedicine and the availability of in person appointments. The patient expressed understanding and agreed to proceed. Magdalene Molly, CMA was able to get the patient set up on a visit, video   Subjective:    Patient ID: Lori Zamora, female    DOB: 10/23/1955, 64 y.o.   MRN: [id number]  Chief Complaint  Patient presents with  . Follow-up    HPI Patient is in today for follow up on chronic medical concerns. No recent febrile illness or hospitalizations. She has been mostly staying in she had appointments at [facility] next month with her liver and GI specialists. Had a bad reaction to a detergent and was itching all over without a rash. She stopped the detergent and most of the itching has improved. Some still on her head and behind her ears but much less. Her older sister has just been diagnosed with Graves Disease. She is unsure if she is going to proceed with a COVID vaccine. Denies CP/palp/SOB/HA/congestion/fevers/GI or GU c/o. Taking meds as prescribed  Past Medical History:  Diagnosis Date  . Abdominal pain, unspecified site 11/29/2013  . Acquired acanthosis nigricans   . Anemia, unspecified   . 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 Bipolar 2 disorder ([place]) 01/31/2010   Qualifier: Diagnosis of  By: Fuller Plan CMA (AAMA), Terri Skains  Follows with Dr Letta Moynahan of psychiatry   . Bipolar disorder, unspecified ([place])   . CFS (chronic fatigue syndrome)   . Degeneration of intervertebral disc, site unspecified   . Depressive disorder, not elsewhere classified   . Esophageal reflux   . FH: breast cancer 05/20/2017  . Gastroparesis 06/07/2015  . Hyperglycemia  01/20/2018  . Intertriginous candidiasis 11/29/2013  . 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 Microcytic anemia 01/31/2010   Qualifier: Diagnosis of  By: Fuller Plan CMA (AAMA), [place]    . Migraine with aura, without mention of intractable migraine without mention of status migrainosus   . Myalgia and myositis, unspecified   . Nontoxic multinodular goiter   . Other and unspecified hyperlipidemia   . Other malaise and fatigue   . Other specified iron deficiency anemias   . Other thalassemia ([place])   . Parotid gland enlargement 2011   related to connective tissue syndrome  . Pelvic floor dysfunction 01/27/2013  . Peripheral edema 02/19/2017  . Preventative health care 09/12/2014  . Renal insufficiency 01/27/2013  . Sjogren's syndrome ([place])   . Small intestinal bacterial overgrowth 11/29/2013  . Systemic lupus erythematosus ([place])   . Thrombocytosis ([place]) 06/07/2016  . Type II or unspecified type diabetes mellitus without mention of complication, not stated as uncontrolled   . Unspecified constipation   . Unspecified diffuse connective tissue disease 01/27/2013   Follows at [facility] rheumatology, Dr Alanda Amass Per patient has tested 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 antibody  . Unspecified essential hypertension   . Unspecified sleep apnea   . Unspecified vitamin D deficiency     Past Surgical History:  Procedure Laterality Date  . BIOPSY THYROID     01/13/2015, 3 previous biopsy reported all benign  . LAPAROSCOPIC TOTAL HYSTERECTOMY  2004    Family History  Problem Relation Age  of Onset  . Diabetes Mother   . Heart disease Mother        family history  . Allergies Mother   . Hypertension Mother   . Alcohol abuse Other        Family history of alcoholism and addiction  . Hypertension Father   . Other Father        viral meningitis  . Alcohol abuse Brother        drug abuse  . Hypertension Brother   . Stroke Sister        4 in 2019  .  Hypertension Sister   . Cancer Sister 82       breast  . Heart disease Maternal Grandmother   . Heart disease Maternal Grandfather   . Obesity Sister   . Hypertension Other        family history  . Kidney disease Other        family history  . Stroke Other        1st degree relative <60    Social History   Socioeconomic History  . Marital status: Single    Spouse name: Not on file  . Number of children: 0  . Years of education: Not on file  . Highest education level: Not on file  Occupational History  . Occupation: Pharmacist, hospital  Tobacco Use  . Smoking status: Never Smoker  . Smokeless tobacco: Never Used  Substance and Sexual Activity  . Alcohol use: No  . Drug use: No  . Sexual activity: Never    Birth control/protection: None    Comment: lives alone, no major dietary restrictions, retired from teaching kindergarten  Other Topics Concern  . 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 Difficulty of Paying Living Expenses: Not on file  Food Insecurity:   . Worried About Charity fundraiser in the Last Year: Not on file  . Ran Out of Food in the Last Year: Not on file  Transportation Needs:   . Lack of Transportation (Medical): Not on file  . Lack of Transportation (Non-Medical): Not on file  Physical Activity:   . Days of Exercise per Week: Not on file  . Minutes of Exercise per Session: Not on file  Stress:   . Feeling of Stress : Not on file  Social Connections:   . Frequency of Communication with Friends and Family: Not on file  . Frequency of Social Gatherings with Friends and Family: Not on file  . Attends Religious Services: Not on file  . Active Member of Clubs or Organizations: Not on file  . Attends Archivist Meetings: Not on file  . Marital Status: Not on file  Intimate Partner Violence:   . Fear of Current or Ex-Partner:  Not on file  . Emotionally Abused: Not on file  . Physically Abused: Not on file  . Sexually Abused: Not on file    Outpatient Medications Prior to Visit  Medication Sig Dispense Refill  . alclomethasone (ACLOVATE) 0.05 % cream Apply 1 application topically 2 (two) times daily.    Marland Kitchen amLODipine (NORVASC) 5 MG tablet TAKE 1 TABLET(5 MG) BY MOUTH TWICE DAILY 180 tablet 1  . Ascorbic Acid (VITAMIN C) 1000 MG tablet Take 1 tablet (1,000 mg total) by mouth daily.    Marland Kitchen atorvastatin (LIPITOR) 20 MG tablet TAKE  3 TABLETS BY MOUTH DAILY 270 tablet 1  . betamethasone dipropionate 0.05 % lotion Apply 1 application topically 2 (two) times daily.    . Blood Glucose Calibration (OT ULTRA/FASTTK CNTRL SOLN) SOLN Check Sugars prn  1  . cholecalciferol (VITAMIN D) 1000 UNITS tablet Take 2,000 Units by mouth daily.     . diphenhydrAMINE (BENADRYL) 25 MG tablet Take 25 mg by mouth as needed.    . diphenhydrAMINE HCl, Sleep, 50 MG tablet Take by mouth.    . empagliflozin (JARDIANCE) 10 MG TABS tablet Take 10 mg by mouth daily.    . folic acid (FOLVITE) 1 MG tablet TAKE 1 TABLET(1 MG) BY MOUTH DAILY 90 tablet 3  . glucose blood test strip 1 each by Other route daily. One Touch Ultra     . ketoconazole (NIZORAL) 2 % cream APPLY EXTERNALLY TO THE AFFECTED AREA DAILY 60 g 2  . Lidocaine, Anorectal, 5 % CREA Apply topically as needed.    Marland Kitchen losartan (COZAAR) 25 MG tablet TAKE 1 TABLET(25 MG) BY MOUTH DAILY 90 tablet 1  . Magnesium Carbonate (MAGNESIUM GLUCONATE) 54mg /23ml syringe Take by mouth.    . Magnesium Oxide 500 MG (LAX) TABS Take by mouth daily. Take 1-4 tablets daily.    . Magnesium Oxide 500 MG (LAX) TABS Take by mouth.    . Magnesium Oxide 500 MG TABS Take by mouth.    . metFORMIN (GLUCOPHAGE) 500 MG tablet Take 1,000 mg by mouth 2 (two) times daily with a meal.     . Mouthwashes (BIOTENE/CALCIUM PBF) LIQD by Transmucosal route.    . NON FORMULARY Lubricant eye ointment- sterile mineral oil 39.9% White  Petroleum 57.7%    . NON FORMULARY Biotene dry mouth oral rinse    . omeprazole (PRILOSEC) 20 MG capsule TAKE 1 CAPSULE BY MOUTH DAILY 90 capsule 1  . Polyethyl Glycol-Propyl Glycol (SYSTANE) 0.4-0.3 % GEL Apply to eye.    Marland Kitchen PRESCRIPTION MEDICATION prevident 5000- dry mouth toothpaste    . Probiotic Product (DIGESTIVE ADVANTAGE GUMMIES PO) Take by mouth 2 (two) times daily.    Marland Kitchen RA SUNSCREEN SPF50 EX Apply topically as needed.      . sodium fluoride (PREVIDENT) 1.1 % GEL dental gel     . folic acid (FOLVITE) 1 MG tablet Take 1 tablet (1 mg total) by mouth daily.     No facility-administered medications prior to visit.    Allergies  Allergen Reactions  . Penicillins Swelling and Anaphylaxis    Swelling of tongue.  . Sulfa Antibiotics Other (See Comments) and Rash    Other Reaction: swellin of tongue Other Reaction: swellin of tongue  . Sulfonamide Derivatives Swelling    REACTION: Swelling of tongue and face  . Acetazolamide   . Azathioprine Other (See Comments)    Very low TPMT in deficient range Unknown  . Ezetimibe Other (See Comments)    Unknown  . Lisinopril Other (See Comments)    Unknown  . Meloxicam Other (See Comments)    Unknown  . Pravastatin Sodium Other (See Comments)    Unknown  . Simvastatin Other (See Comments)    Unknown  . Latex Hives and Rash    Review of Systems  Constitutional: Positive for malaise/fatigue. Negative for fever.  HENT: Negative for congestion.   Eyes: Negative for blurred vision.  Respiratory: Negative for shortness of breath.   Cardiovascular: Negative for chest pain, palpitations and leg swelling.  Gastrointestinal: Negative for abdominal pain, blood in stool and  nausea.  Genitourinary: Negative for dysuria and frequency.  Musculoskeletal: Positive for back pain and myalgias. Negative for falls.  Skin: Positive for itching. Negative for rash.  Neurological: Negative for dizziness, loss of consciousness and headaches.    Endo/Heme/Allergies: Negative for environmental allergies.  Psychiatric/Behavioral: Negative for depression. The patient is not nervous/anxious.        Objective:    Physical Exam Constitutional:      Appearance: Normal appearance. She is not ill-appearing.  HENT:     Head: Normocephalic and atraumatic.  Eyes:     General:        Right eye: No discharge.        Left eye: No discharge.  Pulmonary:     Effort: Pulmonary effort is normal.  Neurological:     Mental Status: She is alert and oriented to person, place, and time.  Psychiatric:        Behavior: Behavior normal.     BP 112/72 (BP Location: Left Arm, Patient Position: Sitting, Cuff Size: Normal)   Wt 177 lb 9.6 oz (80.6 kg)   BMI 29.10 kg/m  Wt Readings from Last 3 Encounters:  10/16/19 177 lb 9.6 oz (80.6 kg)  08/22/18 179 lb 4 oz (81.3 kg)  07/24/18 178 lb 6.4 oz (80.9 kg)    Diabetic Foot Exam - Simple   No data filed     Lab Results  Component Value Date   WBC 8.3 10/16/2019   HGB 12.5 10/16/2019   HCT 40.2 10/16/2019   PLT 497.0 (H) 10/16/2019   GLUCOSE 91 10/16/2019   CHOL 147 10/16/2019   TRIG 111.0 10/16/2019   HDL 42.20 10/16/2019   LDLDIRECT 95 12/22/2009   LDLCALC 83 10/16/2019   ALT 15 10/16/2019   AST 13 10/16/2019   NA 140 10/16/2019   K 4.2 10/16/2019   CL 103 10/16/2019   CREATININE 0.74 10/16/2019   BUN 11 10/16/2019   CO2 26 10/16/2019   TSH 1.57 10/16/2019   HGBA1C 6.8 (H) 10/16/2019   MICROALBUR 1.6 10/16/2019    Lab Results  Component Value Date   TSH 1.57 10/16/2019   Lab Results  Component Value Date   WBC 8.3 10/16/2019   HGB 12.5 10/16/2019   HCT 40.2 10/16/2019   MCV 76.4 (L) 10/16/2019   PLT 497.0 (H) 10/16/2019   Lab Results  Component Value Date   NA 140 10/16/2019   K 4.2 10/16/2019   CO2 26 10/16/2019   GLUCOSE 91 10/16/2019   BUN 11 10/16/2019   CREATININE 0.74 10/16/2019   BILITOT 0.4 10/16/2019   ALKPHOS 129 (H) 10/16/2019   AST 13  10/16/2019   ALT 15 10/16/2019   PROT 7.2 10/16/2019   ALBUMIN 4.4 10/16/2019   CALCIUM 9.9 10/16/2019   ANIONGAP 10 08/22/2018   GFR 96.00 10/16/2019   Lab Results  Component Value Date   CHOL 147 10/16/2019   Lab Results  Component Value Date   HDL 42.20 10/16/2019   Lab Results  Component Value Date   LDLCALC 83 10/16/2019   Lab Results  Component Value Date   TRIG 111.0 10/16/2019   Lab Results  Component Value Date   CHOLHDL 3 10/16/2019   Lab Results  Component Value Date   HGBA1C 6.8 (H) 10/16/2019       Assessment & Plan:   Problem List Items Addressed This Visit    Vitamin D deficiency    Supplement and monitor      Hyperlipidemia,  mixed    Encouraged heart healthy diet, increase exercise, avoid trans fats, consider a krill oil cap daily      Relevant Medications   aspirin EC 81 MG tablet   Other iron deficiency anemias   Relevant Medications   folic acid (FOLVITE) 1 MG tablet   Essential hypertension    Reviewed patients home numbers sent in via mychart. Well controlled, no changes to meds. Encouraged heart healthy diet such as the DASH diet and exercise as tolerated.       Relevant Medications   aspirin EC 81 MG tablet   Renal insufficiency    Hydrate and monitor      Dermatitis    Had a bad reaction to a detergent and was itching all over without a rash. She stopped the detergent and most of the itching has improved. Some still on her head and behind her ears but much less. Encouraged Sarna anti itch lotion, if worsens again consider Zyrtec and Pepcid bid.        Other Visit Diagnoses    Hyperglycemia          I am having Lori Zamora start on aspirin EC. I am also having her maintain her metFORMIN, cholecalciferol, glucose blood, RA SUNSCREEN SPF50 EX, Magnesium Oxide, diphenhydrAMINE, Lidocaine (Anorectal), PRESCRIPTION MEDICATION, NON FORMULARY, NON FORMULARY, Polyethyl Glycol-Propyl Glycol, Probiotic Product (DIGESTIVE  ADVANTAGE GUMMIES PO), empagliflozin, alclomethasone, betamethasone dipropionate, OT ULTRA/FASTTK CNTRL SOLN, magnesium gluconate, diphenhydrAMINE HCl (Sleep), Magnesium Oxide, Biotene/Calcium PBF, sodium fluoride, Magnesium Oxide, ketoconazole, vitamin C, losartan, atorvastatin, omeprazole, amLODipine, folic acid, and folic acid.  Meds ordered this encounter  Medications  . aspirin EC 81 MG tablet    Sig: Take 1 tablet (81 mg total) by mouth daily.    Dispense:     . folic acid (FOLVITE) 1 MG tablet    Sig: Take 1 tablet (1 mg total) by mouth daily.    Dispense:  90 tablet    Refill:  3     I discussed the assessment and treatment plan with the patient. The patient was provided an opportunity to ask questions and all were answered. The patient agreed with the plan and demonstrated an understanding of the instructions.   The patient was advised to call back or seek an in-person evaluation if the symptoms worsen or if the condition fails to improve as anticipated.  I provided 25 minutes of non-face-to-face time during this encounter.   [name], MD

## 2019-10-16 NOTE — Assessment & Plan Note (Signed)
Reviewed patients home numbers sent in via mychart. Well controlled, no changes to meds. Encouraged heart healthy diet such as the DASH diet and exercise as tolerated.

## 2019-10-16 NOTE — Patient Instructions (Signed)
Omron Blood Pressure cuff, upper arm, want BP 100-140/60-90 Pulse oximeter, want oxygen in 90s  Weekly vitals  Take Multivitamin with minerals, selenium Vitamin D 1000-2000 IU daily Probiotic with lactobacillus and bifidophilus Asprin EC 81 mg daily  Melatonin 2-5 mg at bedtime  [email or web address] 

## 2019-10-24 ENCOUNTER — Other Ambulatory Visit: Payer: Self-pay | Admitting: Family Medicine

## 2019-11-06 ENCOUNTER — Encounter: Payer: Self-pay | Admitting: Family Medicine

## 2019-11-10 ENCOUNTER — Encounter: Payer: Self-pay | Admitting: Family Medicine

## 2019-11-11 ENCOUNTER — Other Ambulatory Visit: Payer: Self-pay | Admitting: *Deleted

## 2019-11-11 DIAGNOSIS — I10 Essential (primary) hypertension: Secondary | ICD-10-CM

## 2019-11-11 NOTE — Telephone Encounter (Signed)
Patient called, appt made and future order placed.

## 2019-11-18 ENCOUNTER — Other Ambulatory Visit (HOSPITAL_BASED_OUTPATIENT_CLINIC_OR_DEPARTMENT_OTHER): Payer: Self-pay | Admitting: Family Medicine

## 2019-11-18 DIAGNOSIS — Z1231 Encounter for screening mammogram for malignant neoplasm of breast: Secondary | ICD-10-CM

## 2019-11-23 ENCOUNTER — Ambulatory Visit (HOSPITAL_BASED_OUTPATIENT_CLINIC_OR_DEPARTMENT_OTHER)
Admission: RE | Admit: 2019-11-23 | Discharge: 2019-11-23 | Disposition: A | Discharge status: Home / Self Care | Payer: BC Managed Care – PPO | Source: Ambulatory Visit | Attending: Family Medicine | Admitting: Family Medicine

## 2019-11-23 ENCOUNTER — Other Ambulatory Visit: Payer: Self-pay

## 2019-11-23 DIAGNOSIS — Z1231 Encounter for screening mammogram for malignant neoplasm of breast: Secondary | ICD-10-CM | POA: Diagnosis not present

## 2020-01-18 ENCOUNTER — Other Ambulatory Visit: Payer: Self-pay

## 2020-01-18 ENCOUNTER — Other Ambulatory Visit (INDEPENDENT_AMBULATORY_CARE_PROVIDER_SITE_OTHER): Payer: BC Managed Care – PPO

## 2020-01-18 DIAGNOSIS — I10 Essential (primary) hypertension: Secondary | ICD-10-CM | POA: Diagnosis not present

## 2020-01-18 LAB — HEPATIC FUNCTION PANEL
ALT: 14 U/L (ref 0–35)
AST: 10 U/L (ref 0–37)
Albumin: 4.3 g/dL (ref 3.5–5.2)
Alkaline Phosphatase: 128 U/L — ABNORMAL HIGH (ref 39–117)
Bilirubin, Direct: 0.1 mg/dL (ref 0.0–0.3)
Total Bilirubin: 0.2 mg/dL (ref 0.2–1.2)
Total Protein: 6.8 g/dL (ref 6.0–8.3)

## 2020-01-23 ENCOUNTER — Other Ambulatory Visit: Payer: Self-pay | Admitting: Family Medicine

## 2020-01-24 ENCOUNTER — Other Ambulatory Visit: Payer: Self-pay | Admitting: Family Medicine

## 2020-04-21 ENCOUNTER — Other Ambulatory Visit: Payer: Self-pay

## 2020-04-21 ENCOUNTER — Ambulatory Visit: Payer: BC Managed Care – PPO | Admitting: Family Medicine

## 2020-04-21 DIAGNOSIS — N289 Disorder of kidney and ureter, unspecified: Secondary | ICD-10-CM | POA: Diagnosis not present

## 2020-04-21 DIAGNOSIS — R748 Abnormal levels of other serum enzymes: Secondary | ICD-10-CM

## 2020-04-21 DIAGNOSIS — E559 Vitamin D deficiency, unspecified: Secondary | ICD-10-CM

## 2020-04-21 DIAGNOSIS — E782 Mixed hyperlipidemia: Secondary | ICD-10-CM | POA: Diagnosis not present

## 2020-04-21 DIAGNOSIS — E1159 Type 2 diabetes mellitus with other circulatory complications: Secondary | ICD-10-CM

## 2020-04-21 DIAGNOSIS — I10 Essential (primary) hypertension: Secondary | ICD-10-CM

## 2020-04-21 DIAGNOSIS — R700 Elevated erythrocyte sedimentation rate: Secondary | ICD-10-CM

## 2020-04-21 DIAGNOSIS — L309 Dermatitis, unspecified: Secondary | ICD-10-CM

## 2020-04-21 MED ORDER — KETOCONAZOLE 2 % EX CREA: TOPICAL_CREAM | CUTANEOUS | 2 refills | Status: DC

## 2020-04-21 MED ORDER — FOLIC ACID 1 MG PO TABS
1.0000 mg | ORAL_TABLET | Freq: Every day | ORAL | 3 refills | Status: DC
Start: 2020-04-21 — End: 2020-11-21

## 2020-04-21 NOTE — Assessment & Plan Note (Signed)
Continue to monitor

## 2020-04-21 NOTE — Assessment & Plan Note (Signed)
Encouraged heart healthy diet, increase exercise, avoid trans fats, consider a krill oil cap daily 

## 2020-04-21 NOTE — Patient Instructions (Signed)
Carbohydrate Counting for Diabetes Mellitus, Adult  Carbohydrate counting is a method of keeping track of how many carbohydrates you eat. Eating carbohydrates naturally increases the amount of sugar (glucose) in the blood. Counting how many carbohydrates you eat helps keep your blood glucose within normal limits, which helps you manage your diabetes (diabetes mellitus). It is important to know how many carbohydrates you can safely have in each meal. This is different for every person. A diet and nutrition specialist (registered dietitian) can help you make a meal plan and calculate how many carbohydrates you should have at each meal and snack. Carbohydrates are found in the following foods:  Grains, such as breads and cereals.  Dried beans and soy products.  Starchy vegetables, such as potatoes, peas, and corn.  Fruit and fruit juices.  Milk and yogurt.  Sweets and snack foods, such as cake, cookies, candy, chips, and soft drinks. How do I count carbohydrates? There are two ways to count carbohydrates in food. You can use either of the methods or a combination of both. Reading "Nutrition Facts" on packaged food The "Nutrition Facts" list is included on the labels of almost all packaged foods and beverages in the [place] It includes:  The serving size.  Information about nutrients in each serving, including the grams (g) of carbohydrate per serving. To use the "Nutrition Facts":  Decide how many servings you will have.  Multiply the number of servings by the number of carbohydrates per serving.  The resulting number is the total amount of carbohydrates that you will be having. Learning standard serving sizes of other foods When you eat carbohydrate foods that are not packaged or do not include "Nutrition Facts" on the label, you need to measure the servings in order to count the amount of carbohydrates:  Measure the foods that you will eat with a food scale or measuring cup, if  needed.  Decide how many standard-size servings you will eat.  Multiply the number of servings by 15. Most carbohydrate-rich foods have about 15 g of carbohydrates per serving. ? For example, if you eat 8 oz (170 g) of strawberries, you will have eaten 2 servings and 30 g of carbohydrates (2 servings x 15 g = 30 g).  For foods that have more than one food mixed, such as soups and casseroles, you must count the carbohydrates in each food that is included. The following list contains standard serving sizes of common carbohydrate-rich foods. Each of these servings has about 15 g of carbohydrates:   hamburger bun or  English muffin.   oz (15 mL) syrup.   oz (14 g) jelly.  1 slice of bread.  1 six-inch tortilla.  3 oz (85 g) cooked rice or pasta.  4 oz (113 g) cooked dried beans.  4 oz (113 g) starchy vegetable, such as peas, corn, or potatoes.  4 oz (113 g) hot cereal.  4 oz (113 g) mashed potatoes or  of a large baked potato.  4 oz (113 g) canned or frozen fruit.  4 oz (120 mL) fruit juice.  4-6 crackers.  6 chicken nuggets.  6 oz (170 g) unsweetened dry cereal.  6 oz (170 g) plain fat-free yogurt or yogurt sweetened with artificial sweeteners.  8 oz (240 mL) milk.  8 oz (170 g) fresh fruit or one small piece of fruit.  24 oz (680 g) popped popcorn. Example of carbohydrate counting Sample meal  3 oz (85 g) chicken breast.  6 oz (170 g)   brown rice.  4 oz (113 g) corn.  8 oz (240 mL) milk.  8 oz (170 g) strawberries with sugar-free whipped topping. Carbohydrate calculation 1. Identify the foods that contain carbohydrates: ? Rice. ? Corn. ? Milk. ? Strawberries. 2. Calculate how many servings you have of each food: ? 2 servings rice. ? 1 serving corn. ? 1 serving milk. ? 1 serving strawberries. 3. Multiply each number of servings by 15 g: ? 2 servings rice x 15 g = 30 g. ? 1 serving corn x 15 g = 15 g. ? 1 serving milk x 15 g = 15 g. ? 1  serving strawberries x 15 g = 15 g. 4. Add together all of the amounts to find the total grams of carbohydrates eaten: ? 30 g + 15 g + 15 g + 15 g = 75 g of carbohydrates total. Summary  Carbohydrate counting is a method of keeping track of how many carbohydrates you eat.  Eating carbohydrates naturally increases the amount of sugar (glucose) in the blood.  Counting how many carbohydrates you eat helps keep your blood glucose within normal limits, which helps you manage your diabetes.  A diet and nutrition specialist (registered dietitian) can help you make a meal plan and calculate how many carbohydrates you should have at each meal and snack. This information is not intended to replace advice given to you by your health care provider. Make sure you discuss any questions you have with your health care provider. Document Revised: 02/21/2017 Document Reviewed: 01/11/2016 Elsevier Patient Education  2020 [employer] Inc.  

## 2020-04-21 NOTE — Assessment & Plan Note (Signed)
Check to monitor

## 2020-04-21 NOTE — Assessment & Plan Note (Signed)
Hydrate and monitor 

## 2020-04-21 NOTE — Assessment & Plan Note (Signed)
Supplement and monitor 

## 2020-04-21 NOTE — Assessment & Plan Note (Signed)
hgba1c acceptable, minimize simple carbs. Increase exercise as tolerated. Continue current meds 

## 2020-04-21 NOTE — Assessment & Plan Note (Signed)
Well controlled, no changes to meds. Encouraged heart healthy diet such as the DASH diet and exercise as tolerated.  °

## 2020-04-21 NOTE — Progress Notes (Signed)
Subjective:    Patient ID: Lori Zamora, female    DOB: February 20, 1956, 64 y.o.   MRN: [id number]  Chief Complaint  Patient presents with  . 6 month followup    HPI Patient is in today for follow up on chronic medical concerns. She is doing well at home. Is managing social distancing and masking. She has not had any recent febrile illness or hospitalizations. She has chosen not to get the COVID vaccine for now. Denies CP/palp/SOB/HA/congestion/fevers/GI or GU c/o. Taking meds as prescribed. No c/o polyuria or polydipsia.   Past Medical History:  Diagnosis Date  . Abdominal pain, unspecified site 11/29/2013  . Acquired acanthosis nigricans   . Anemia, unspecified   . 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 Bipolar 2 disorder ([place]) 01/31/2010   Qualifier: Diagnosis of  By: Fuller Plan CMA (AAMA), Terri Skains  Follows with Dr Letta Moynahan of psychiatry   . Bipolar disorder, unspecified ([place])   . CFS (chronic fatigue syndrome)   . Degeneration of intervertebral disc, site unspecified   . Depressive disorder, not elsewhere classified   . Esophageal reflux   . FH: breast cancer 05/20/2017  . Gastroparesis 06/07/2015  . Hyperglycemia 01/20/2018  . Intertriginous candidiasis 11/29/2013  . 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 Microcytic anemia 01/31/2010   Qualifier: Diagnosis of  By: Fuller Plan CMA (AAMA), [place]    . Migraine with aura, without mention of intractable migraine without mention of status migrainosus   . Myalgia and myositis, unspecified   . Nontoxic multinodular goiter   . Other and unspecified hyperlipidemia   . Other malaise and fatigue   . Other specified iron deficiency anemias   . Other thalassemia ([place])   . Parotid gland enlargement 2011   related to connective tissue syndrome  . Pelvic floor dysfunction 01/27/2013  . Peripheral edema 02/19/2017  . Preventative health care 09/12/2014  . Renal insufficiency 01/27/2013  . Sjogren's syndrome ([place])   . Small  intestinal bacterial overgrowth 11/29/2013  . Systemic lupus erythematosus ([place])   . Thrombocytosis ([place]) 06/07/2016  . Type II or unspecified type diabetes mellitus without mention of complication, not stated as uncontrolled   . Unspecified constipation   . Unspecified diffuse connective tissue disease 01/27/2013   Follows at [facility] rheumatology, Dr Alanda Amass Per patient has tested 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 antibody  . Unspecified essential hypertension   . Unspecified sleep apnea   . Unspecified vitamin D deficiency     Past Surgical History:  Procedure Laterality Date  . BIOPSY THYROID     01/13/2015, 3 previous biopsy reported all benign  . LAPAROSCOPIC TOTAL HYSTERECTOMY  2004    Family History  Problem Relation Age of Onset  . Diabetes Mother   . Heart disease Mother        family history  . Allergies Mother   . Hypertension Mother   . Alcohol abuse Other        Family history of alcoholism and addiction  . Hypertension Father   . Other Father        viral meningitis  . Alcohol abuse Brother        drug abuse  . Hypertension Brother   . Stroke Sister        4 in 2019  . Hypertension Sister   . Cancer Sister 67       breast  . Heart disease Maternal Grandmother   . Heart disease Maternal Grandfather   .  Obesity Sister   . Hypertension Other        family history  . Kidney disease Other        family history  . Stroke Other        1st degree relative <60    Social History   Socioeconomic History  . Marital status: Single    Spouse name: Not on file  . Number of children: 0  . Years of education: Not on file  . Highest education level: Not on file  Occupational History  . Occupation: Pharmacist, hospital  Tobacco Use  . Smoking status: Never Smoker  . Smokeless tobacco: Never Used  Vaping Use  . Vaping Use: Never assessed  Substance and Sexual Activity  . Alcohol use: No  . Drug use: No  . Sexual activity: Never      Birth control/protection: None    Comment: lives alone, no major dietary restrictions, retired from teaching kindergarten  Other Topics Concern  . 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 Difficulty of Paying Living Expenses: Not on file  Food Insecurity:   . Worried About Charity fundraiser in the Last Year: Not on file  . Ran Out of Food in the Last Year: Not on file  Transportation Needs:   . Lack of Transportation (Medical): Not on file  . Lack of Transportation (Non-Medical): Not on file  Physical Activity:   . Days of Exercise per Week: Not on file  . Minutes of Exercise per Session: Not on file  Stress:   . Feeling of Stress : Not on file  Social Connections:   . Frequency of Communication with Friends and Family: Not on file  . Frequency of Social Gatherings with Friends and Family: Not on file  . Attends Religious Services: Not on file  . Active Member of Clubs or Organizations: Not on file  . Attends Archivist Meetings: Not on file  . Marital Status: Not on file  Intimate Partner Violence:   . Fear of Current or Ex-Partner: Not on file  . Emotionally Abused: Not on file  . Physically Abused: Not on file  . Sexually Abused: Not on file    Outpatient Medications Prior to Visit  Medication Sig Dispense Refill  . alclomethasone (ACLOVATE) 0.05 % cream Apply 1 application topically 2 (two) times daily.    Marland Kitchen amLODipine (NORVASC) 5 MG tablet TAKE 1 TABLET(5 MG) BY MOUTH TWICE DAILY 180 tablet 1  . Ascorbic Acid (VITAMIN C) 1000 MG tablet Take 1 tablet (1,000 mg total) by mouth daily.    Marland Kitchen aspirin EC 81 MG tablet Take 1 tablet (81 mg total) by mouth daily.    Marland Kitchen atorvastatin (LIPITOR) 20 MG tablet TAKE 3 TABLETS BY MOUTH DAILY 270 tablet 1  . Blood Glucose Calibration (OT ULTRA/FASTTK CNTRL SOLN) SOLN Check Sugars prn  1  .  cholecalciferol (VITAMIN D) 1000 UNITS tablet Take 2,000 Units by mouth daily.     . diphenhydrAMINE HCl, Sleep, 50 MG tablet Take by mouth.    . empagliflozin (JARDIANCE) 10 MG TABS tablet Take 10 mg by mouth daily.    . folic acid (FOLVITE) 1 MG tablet Take 1 tablet (1 mg total) by mouth daily. 90 tablet 3  . glucose blood test strip 1 each by Other route daily. One Touch Ultra     .  ketoconazole (NIZORAL) 2 % cream APPLY EXTERNALLY TO THE AFFECTED AREA DAILY 60 g 2  . Lidocaine, Anorectal, 5 % CREA Apply topically as needed.    Marland Kitchen losartan (COZAAR) 25 MG tablet TAKE 1 TABLET(25 MG) BY MOUTH DAILY 90 tablet 1  . Magnesium Oxide 500 MG (LAX) TABS Take by mouth.    . Magnesium Oxide 500 MG TABS Take by mouth.    . metFORMIN (GLUCOPHAGE) 500 MG tablet Take 1,000 mg by mouth 2 (two) times daily with a meal.     . Mouthwashes (BIOTENE/CALCIUM PBF) LIQD by Transmucosal route.    . NON FORMULARY Lubricant eye ointment- sterile mineral oil 39.9% White Petroleum 57.7%    . NON FORMULARY Biotene dry mouth oral rinse    . omeprazole (PRILOSEC) 20 MG capsule TAKE 1 CAPSULE BY MOUTH DAILY 90 capsule 1  . Polyethyl Glycol-Propyl Glycol (SYSTANE) 0.4-0.3 % GEL Apply to eye.    Marland Kitchen PRESCRIPTION MEDICATION prevident 5000- dry mouth toothpaste    . Probiotic Product (DIGESTIVE ADVANTAGE GUMMIES PO) Take by mouth 2 (two) times daily.    Marland Kitchen RA SUNSCREEN SPF50 EX Apply topically as needed.      . sodium fluoride (PREVIDENT) 1.1 % GEL dental gel     . zinc gluconate 50 MG tablet Take 50 mg by mouth daily.    . betamethasone dipropionate 0.05 % lotion Apply 1 application topically 2 (two) times daily.    . diphenhydrAMINE (BENADRYL) 25 MG tablet Take 25 mg by mouth as needed.    . folic acid (FOLVITE) 1 MG tablet TAKE 1 TABLET(1 MG) BY MOUTH DAILY 90 tablet 3  . Magnesium Carbonate (MAGNESIUM GLUCONATE) 54mg /60ml syringe Take by mouth.    . Magnesium Oxide 500 MG (LAX) TABS Take by mouth daily. Take 1-4 tablets  daily.     No facility-administered medications prior to visit.    Allergies  Allergen Reactions  . Penicillins Swelling and Anaphylaxis    Swelling of tongue.  . Sulfa Antibiotics Other (See Comments) and Rash    Other Reaction: swellin of tongue Other Reaction: swellin of tongue  . Sulfonamide Derivatives Swelling    REACTION: Swelling of tongue and face  . Acetazolamide   . Azathioprine Other (See Comments)    Very low TPMT in deficient range Unknown  . Ezetimibe Other (See Comments)    Unknown  . Lisinopril Other (See Comments)    Unknown  . Meloxicam Other (See Comments)    Unknown  . Pravastatin Sodium Other (See Comments)    Unknown  . Simvastatin Other (See Comments)    Unknown  . Latex Hives and Rash    Review of Systems  Constitutional: Negative for fever and malaise/fatigue.  HENT: Negative for congestion.   Eyes: Negative for blurred vision.  Respiratory: Negative for shortness of breath.   Cardiovascular: Negative for chest pain, palpitations and leg swelling.  Gastrointestinal: Negative for abdominal pain, blood in stool and nausea.  Genitourinary: Negative for dysuria and frequency.  Musculoskeletal: Negative for falls.  Skin: Negative for rash.  Neurological: Negative for dizziness, loss of consciousness and headaches.  Endo/Heme/Allergies: Negative for environmental allergies.  Psychiatric/Behavioral: Negative for depression. The patient is not nervous/anxious.        Objective:    Physical Exam Vitals and nursing note reviewed.  Constitutional:      General: She is not in acute distress.    Appearance: She is well-developed.  HENT:     Head: Normocephalic and atraumatic.  Nose: Nose normal.  Eyes:     General:        Right eye: No discharge.        Left eye: No discharge.  Cardiovascular:     Rate and Rhythm: Normal rate and regular rhythm.     Heart sounds: No murmur heard.   Pulmonary:     Effort: Pulmonary effort is normal.      Breath sounds: Normal breath sounds.  Abdominal:     General: Bowel sounds are normal.     Palpations: Abdomen is soft.     Tenderness: There is no abdominal tenderness.  Musculoskeletal:     Cervical back: Normal range of motion and neck supple.  Skin:    General: Skin is warm and dry.  Neurological:     Mental Status: She is alert and oriented to person, place, and time.     BP 110/62 (BP Location: Left Arm, Patient Position: Sitting, Cuff Size: Large)   Pulse 92   Temp 98.3 F (36.8 C) (Oral)   Resp 12   Ht 5\' 5"  (1.651 m)   Wt 172 lb 6.4 oz (78.2 kg)   SpO2 99%   BMI 28.69 kg/m  Wt Readings from Last 3 Encounters:  04/21/20 172 lb 6.4 oz (78.2 kg)  10/16/19 177 lb 9.6 oz (80.6 kg)  08/22/18 179 lb 4 oz (81.3 kg)    Diabetic Foot Exam - Simple   No data filed     Lab Results  Component Value Date   WBC 8.3 10/16/2019   HGB 12.5 10/16/2019   HCT 40.2 10/16/2019   PLT 497.0 (H) 10/16/2019   GLUCOSE 91 10/16/2019   CHOL 147 10/16/2019   TRIG 111.0 10/16/2019   HDL 42.20 10/16/2019   LDLDIRECT 95 12/22/2009   LDLCALC 83 10/16/2019   ALT 14 01/18/2020   AST 10 01/18/2020   NA 140 10/16/2019   K 4.2 10/16/2019   CL 103 10/16/2019   CREATININE 0.74 10/16/2019   BUN 11 10/16/2019   CO2 26 10/16/2019   TSH 1.57 10/16/2019   HGBA1C 6.8 (H) 10/16/2019   MICROALBUR 1.6 10/16/2019    Lab Results  Component Value Date   TSH 1.57 10/16/2019   Lab Results  Component Value Date   WBC 8.3 10/16/2019   HGB 12.5 10/16/2019   HCT 40.2 10/16/2019   MCV 76.4 (L) 10/16/2019   PLT 497.0 (H) 10/16/2019   Lab Results  Component Value Date   NA 140 10/16/2019   K 4.2 10/16/2019   CO2 26 10/16/2019   GLUCOSE 91 10/16/2019   BUN 11 10/16/2019   CREATININE 0.74 10/16/2019   BILITOT 0.2 01/18/2020   ALKPHOS 128 (H) 01/18/2020   AST 10 01/18/2020   ALT 14 01/18/2020   PROT 6.8 01/18/2020   ALBUMIN 4.3 01/18/2020   CALCIUM 9.9 10/16/2019   ANIONGAP 10  08/22/2018   GFR 96.00 10/16/2019   Lab Results  Component Value Date   CHOL 147 10/16/2019   Lab Results  Component Value Date   HDL 42.20 10/16/2019   Lab Results  Component Value Date   LDLCALC 83 10/16/2019   Lab Results  Component Value Date   TRIG 111.0 10/16/2019   Lab Results  Component Value Date   CHOLHDL 3 10/16/2019   Lab Results  Component Value Date   HGBA1C 6.8 (H) 10/16/2019       Assessment & Plan:   Problem List Items Addressed This Visit  Vitamin D deficiency    Supplement and monitor      Relevant Orders   VITAMIN D 25 Hydroxy (Vit-D Deficiency, Fractures)   Hyperlipidemia, mixed    Encouraged heart healthy diet, increase exercise, avoid trans fats, consider a krill oil cap daily      Relevant Orders   Lipid panel   Essential hypertension    Well controlled, no changes to meds. Encouraged heart healthy diet such as the DASH diet and exercise as tolerated.       Relevant Orders   CBC   Comprehensive metabolic panel   TSH   Renal insufficiency    Hydrate and monitor      Elevated alkaline phosphatase level    Check to monitor      Elevated sed rate    Continue to monitor      Relevant Orders   Sedimentation rate   Diabetes mellitus (H[facility])    hgba1c acceptable, minimize simple carbs. Increase exercise as tolerated. Continue current meds      Relevant Orders   Hemoglobin A1c      I have discontinued Darrold Junker. [place]'s diphenhydrAMINE, betamethasone dipropionate, and magnesium gluconate. I am also having her maintain her metFORMIN, cholecalciferol, glucose blood, RA SUNSCREEN SPF50 EX, Lidocaine (Anorectal), PRESCRIPTION MEDICATION, NON FORMULARY, NON FORMULARY, Polyethyl Glycol-Propyl Glycol, Probiotic Product (DIGESTIVE ADVANTAGE GUMMIES PO), empagliflozin, alclomethasone, OT ULTRA/FASTTK CNTRL SOLN, diphenhydrAMINE HCl (Sleep), Magnesium Oxide, Biotene/Calcium PBF, sodium fluoride, Magnesium Oxide, ketoconazole, vitamin C,  aspirin EC, folic acid, losartan, atorvastatin, omeprazole, amLODipine, and zinc gluconate.  No orders of the defined types were placed in this encounter.    [name], MD

## 2020-04-22 LAB — COMPREHENSIVE METABOLIC PANEL
AG Ratio: 1.7 (calc) (ref 1.0–2.5)
ALT: 14 U/L (ref 6–29)
AST: 12 U/L (ref 10–35)
Albumin: 4.5 g/dL (ref 3.6–5.1)
Alkaline phosphatase (APISO): 122 U/L (ref 37–153)
BUN: 12 mg/dL (ref 7–25)
CO2: 26 mmol/L (ref 20–32)
Calcium: 10.1 mg/dL (ref 8.6–10.4)
Chloride: 104 mmol/L (ref 98–110)
Creat: 0.83 mg/dL (ref 0.50–0.99)
Globulin: 2.7 g/dL (calc) (ref 1.9–3.7)
Glucose, Bld: 91 mg/dL (ref 65–99)
Potassium: 4.7 mmol/L (ref 3.5–5.3)
Sodium: 142 mmol/L (ref 135–146)
Total Bilirubin: 0.3 mg/dL (ref 0.2–1.2)
Total Protein: 7.2 g/dL (ref 6.1–8.1)

## 2020-04-22 LAB — CBC
HCT: 43.9 % (ref 35.0–45.0)
Hemoglobin: 13.3 g/dL (ref 11.7–15.5)
MCH: 24.1 pg — ABNORMAL LOW (ref 27.0–33.0)
MCHC: 30.3 g/dL — ABNORMAL LOW (ref 32.0–36.0)
MCV: 79.5 fL — ABNORMAL LOW (ref 80.0–100.0)
MPV: 9.3 fL (ref 7.5–12.5)
Platelets: 483 10*3/uL — ABNORMAL HIGH (ref 140–400)
RBC: 5.52 10*6/uL — ABNORMAL HIGH (ref 3.80–5.10)
RDW: 17.2 % — ABNORMAL HIGH (ref 11.0–15.0)
WBC: 7 10*3/uL (ref 3.8–10.8)

## 2020-04-22 LAB — LIPID PANEL
Cholesterol: 148 mg/dL (ref <200)
HDL: 39 mg/dL — ABNORMAL LOW (ref >=50)
LDL Cholesterol (Calc): 89 mg/dL (calc)
Non-HDL Cholesterol (Calc): 109 mg/dL (calc) (ref <130)
Total CHOL/HDL Ratio: 3.8 (calc) (ref <5.0)
Triglycerides: 106 mg/dL (ref <150)

## 2020-04-22 LAB — TSH: TSH: 1.59 mIU/L (ref 0.40–4.50)

## 2020-04-22 LAB — HEMOGLOBIN A1C
Hgb A1c MFr Bld: 6.6 % of total Hgb — ABNORMAL HIGH (ref <5.7)
Mean Plasma Glucose: 143 (calc)
eAG (mmol/L): 7.9 (calc)

## 2020-04-22 LAB — VITAMIN D 25 HYDROXY (VIT D DEFICIENCY, FRACTURES): Vit D, 25-Hydroxy: 59 ng/mL (ref 30–100)

## 2020-04-22 LAB — SEDIMENTATION RATE: Sed Rate: 28 mm/h (ref 0–30)

## 2020-04-29 ENCOUNTER — Other Ambulatory Visit: Payer: Self-pay | Admitting: Family Medicine

## 2020-05-22 ENCOUNTER — Other Ambulatory Visit: Payer: Self-pay | Admitting: Family Medicine

## 2020-06-05 ENCOUNTER — Encounter: Payer: Self-pay | Admitting: Family Medicine

## 2020-06-06 ENCOUNTER — Telehealth: Payer: Self-pay

## 2020-06-06 NOTE — Telephone Encounter (Signed)
PA initiated via Covermymeds; KEY: [id number]. PA cancelled by plan.   Your PA has been resolved, no additional PA is required. For further inquiries please contact the number on the back of the member prescription card. (Message [phone])

## 2020-07-30 ENCOUNTER — Other Ambulatory Visit: Payer: Self-pay | Admitting: Family Medicine

## 2020-08-04 LAB — HM DIABETES EYE EXAM

## 2020-10-20 ENCOUNTER — Encounter: Payer: Self-pay | Admitting: Family Medicine

## 2020-10-20 ENCOUNTER — Ambulatory Visit (INDEPENDENT_AMBULATORY_CARE_PROVIDER_SITE_OTHER): Payer: BC Managed Care – PPO | Admitting: Family Medicine

## 2020-10-20 ENCOUNTER — Other Ambulatory Visit: Payer: Self-pay

## 2020-10-20 VITALS — BP 112/64 | HR 91 | Temp 98.2°F | Resp 16 | Ht 65.0 in | Wt 168.8 lb

## 2020-10-20 DIAGNOSIS — N289 Disorder of kidney and ureter, unspecified: Secondary | ICD-10-CM | POA: Diagnosis not present

## 2020-10-20 DIAGNOSIS — D508 Other iron deficiency anemias: Secondary | ICD-10-CM | POA: Diagnosis not present

## 2020-10-20 DIAGNOSIS — E1159 Type 2 diabetes mellitus with other circulatory complications: Secondary | ICD-10-CM | POA: Diagnosis not present

## 2020-10-20 DIAGNOSIS — Z1211 Encounter for screening for malignant neoplasm of colon: Secondary | ICD-10-CM

## 2020-10-20 DIAGNOSIS — I10 Essential (primary) hypertension: Secondary | ICD-10-CM | POA: Diagnosis not present

## 2020-10-20 DIAGNOSIS — E782 Mixed hyperlipidemia: Secondary | ICD-10-CM | POA: Diagnosis not present

## 2020-10-20 DIAGNOSIS — R700 Elevated erythrocyte sedimentation rate: Secondary | ICD-10-CM

## 2020-10-20 DIAGNOSIS — E559 Vitamin D deficiency, unspecified: Secondary | ICD-10-CM

## 2020-10-20 DIAGNOSIS — D509 Iron deficiency anemia, unspecified: Secondary | ICD-10-CM

## 2020-10-20 DIAGNOSIS — Z0000 Encounter for general adult medical examination without abnormal findings: Secondary | ICD-10-CM | POA: Diagnosis not present

## 2020-10-20 DIAGNOSIS — M359 Systemic involvement of connective tissue, unspecified: Secondary | ICD-10-CM

## 2020-10-20 LAB — CBC WITH DIFFERENTIAL/PLATELET
Basophils Absolute: 0 10*3/uL (ref 0.0–0.1)
Basophils Relative: 0.7 % (ref 0.0–3.0)
Eosinophils Absolute: 0 10*3/uL (ref 0.0–0.7)
Eosinophils Relative: 0.7 % (ref 0.0–5.0)
HCT: 41.7 % (ref 36.0–46.0)
Hemoglobin: 13 g/dL (ref 12.0–15.0)
Lymphocytes Relative: 25.8 % (ref 12.0–46.0)
Lymphs Abs: 1.8 10*3/uL (ref 0.7–4.0)
MCHC: 31.1 g/dL (ref 30.0–36.0)
MCV: 76.9 fl — ABNORMAL LOW (ref 78.0–100.0)
Monocytes Absolute: 0.3 10*3/uL (ref 0.1–1.0)
Monocytes Relative: 5 % (ref 3.0–12.0)
Neutro Abs: 4.7 10*3/uL (ref 1.4–7.7)
Neutrophils Relative %: 67.8 % (ref 43.0–77.0)
Platelets: 489 10*3/uL — ABNORMAL HIGH (ref 150.0–400.0)
RBC: 5.42 Mil/uL — ABNORMAL HIGH (ref 3.87–5.11)
RDW: 19.3 % — ABNORMAL HIGH (ref 11.5–15.5)
WBC: 6.9 10*3/uL (ref 4.0–10.5)

## 2020-10-20 LAB — COMPREHENSIVE METABOLIC PANEL
ALT: 15 U/L (ref 0–35)
AST: 12 U/L (ref 0–37)
Albumin: 4.4 g/dL (ref 3.5–5.2)
Alkaline Phosphatase: 101 U/L (ref 39–117)
BUN: 10 mg/dL (ref 6–23)
CO2: 30 mEq/L (ref 19–32)
Calcium: 10 mg/dL (ref 8.4–10.5)
Chloride: 103 mEq/L (ref 96–112)
Creatinine, Ser: 0.84 mg/dL (ref 0.40–1.20)
GFR: 73.8 mL/min (ref >60.00)
Glucose, Bld: 94 mg/dL (ref 70–99)
Potassium: 4.6 mEq/L (ref 3.5–5.1)
Sodium: 141 mEq/L (ref 135–145)
Total Bilirubin: 0.4 mg/dL (ref 0.2–1.2)
Total Protein: 7.1 g/dL (ref 6.0–8.3)

## 2020-10-20 LAB — LIPID PANEL
Cholesterol: 143 mg/dL (ref 0–200)
HDL: 40.4 mg/dL (ref >39.00)
LDL Cholesterol: 81 mg/dL (ref 0–99)
NonHDL: 102.84
Total CHOL/HDL Ratio: 4
Triglycerides: 110 mg/dL (ref 0.0–149.0)
VLDL: 22 mg/dL (ref 0.0–40.0)

## 2020-10-20 LAB — HEMOGLOBIN A1C: Hgb A1c MFr Bld: 6.7 % — ABNORMAL HIGH (ref 4.6–6.5)

## 2020-10-20 LAB — TSH: TSH: 1.34 u[IU]/mL (ref 0.35–4.50)

## 2020-10-20 LAB — SEDIMENTATION RATE: Sed Rate: 34 mm/hr — ABNORMAL HIGH (ref 0–30)

## 2020-10-20 NOTE — Assessment & Plan Note (Signed)
Colonoscopy 2019, repeat in 10 years

## 2020-10-20 NOTE — Assessment & Plan Note (Signed)
Hydrate and monitor 

## 2020-10-20 NOTE — Assessment & Plan Note (Signed)
No recent flares or concerns.

## 2020-10-20 NOTE — Patient Instructions (Signed)
Preventive Care 64-64 Years Oldears Old, Female Preventive care refers to lifestyle choices and visits with your health care provider that can promote health and wellness. This includes:  A yearly physical exam. This is also called an annual wellness visit.  Regular dental and eye exams.  Immunizations.  Screening for certain conditions.  Healthy lifestyle choices, such as: ? Eating a healthy diet. ? Getting regular exercise. ? Not using drugs or products that contain nicotine and tobacco. ? Limiting alcohol use. What can I expect for my preventive care visit? Physical exam Your health care provider will check your:  Height and weight. These may be used to calculate your BMI (body mass index). BMI is a measurement that tells if you are at a healthy weight.  Heart rate and blood pressure.  Body temperature.  Skin for abnormal spots. Counseling Your health care provider may ask you questions about your:  Past medical problems.  Family's medical history.  Alcohol, tobacco, and drug use.  Emotional well-being.  Home life and relationship well-being.  Sexual activity.  Diet, exercise, and sleep habits.  Work and work Statistician.  Access to firearms.  Method of birth control.  Menstrual cycle.  Pregnancy history. What immunizations do I need? Vaccines are usually given at various ages, according to a schedule. Your health care provider will recommend vaccines for you based on your age, medical history, and lifestyle or other factors, such as travel or where you work.   What tests do I need? Blood tests  Lipid and cholesterol levels. These may be checked every 5 years, or more often if you are over 64 years old.  Hepatitis C test.  Hepatitis B test. Screening  Lung cancer screening. You may have this screening every year starting at age 64 if you have a 30-pack-year history of smoking and currently smoke or have quit within the past 15 years.  Colorectal cancer  screening. ? All adults should have this screening starting at age 64 and continuing until age 17. ? Your health care provider may recommend screening at age 49 if you are at increased risk. ? You will have tests every 1-10 years, depending on your results and the type of screening test.  Diabetes screening. ? This is done by checking your blood sugar (glucose) after you have not eaten for a while (fasting). ? You may have this done every 1-3 years.  Mammogram. ? This may be done every 1-2 years. ? Talk with your health care provider about when you should start having regular mammograms. This may depend on whether you have a family history of breast cancer.  BRCA-related cancer screening. This may be done if you have a family history of breast, ovarian, tubal, or peritoneal cancers.  Pelvic exam and Pap test. ? This may be done every 3 years starting at age 10. ? Starting at age 11, this may be done every 5 years if you have a Pap test in combination with an HPV test. Other tests  STD (sexually transmitted disease) testing, if you are at risk.  Bone density scan. This is done to screen for osteoporosis. You may have this scan if you are at high risk for osteoporosis. Talk with your health care provider about your test results, treatment options, and if necessary, the need for more tests. Follow these instructions at home: Eating and drinking  Eat a diet that includes fresh fruits and vegetables, whole grains, lean protein, and low-fat dairy products.  Take vitamin and mineral supplements  as recommended by your health care provider.  Do not drink alcohol if: ? Your health care provider tells you not to drink. ? You are pregnant, may be pregnant, or are planning to become pregnant.  If you drink alcohol: ? Limit how much you have to 0-1 drink a day. ? Be aware of how much alcohol is in your drink. In the [place] one drink equals one 12 oz bottle of beer (355 mL), one 5 oz glass of  wine (148 mL), or one 1 oz glass of hard liquor (44 mL).   Lifestyle  Take daily care of your teeth and gums. Brush your teeth every morning and night with fluoride toothpaste. Floss one time each day.  Stay active. Exercise for at least 30 minutes 5 or more days each week.  Do not use any products that contain nicotine or tobacco, such as cigarettes, e-cigarettes, and chewing tobacco. If you need help quitting, ask your health care provider.  Do not use drugs.  If you are sexually active, practice safe sex. Use a condom or other form of protection to prevent STIs (sexually transmitted infections).  If you do not wish to become pregnant, use a form of birth control. If you plan to become pregnant, see your health care provider for a prepregnancy visit.  If told by your health care provider, take low-dose aspirin daily starting at age 50.  Find healthy ways to cope with stress, such as: ? Meditation, yoga, or listening to music. ? Journaling. ? Talking to a trusted person. ? Spending time with friends and family. Safety  Always wear your seat belt while driving or riding in a vehicle.  Do not drive: ? If you have been drinking alcohol. Do not ride with someone who has been drinking. ? When you are tired or distracted. ? While texting.  Wear a helmet and other protective equipment during sports activities.  If you have firearms in your house, make sure you follow all gun safety procedures. What's next?  Visit your health care provider once a year for an annual wellness visit.  Ask your health care provider how often you should have your eyes and teeth checked.  Stay up to date on all vaccines. This information is not intended to replace advice given to you by your health care provider. Make sure you discuss any questions you have with your health care provider. Document Revised: 05/03/2020 Document Reviewed: 04/10/2018 Elsevier Patient Education  2021 [employer]vier Inc.  

## 2020-10-20 NOTE — Assessment & Plan Note (Signed)
Repeat with labs

## 2020-10-20 NOTE — Assessment & Plan Note (Signed)
Encouraged heart healthy diet, increase exercise, avoid trans fats, consider a krill oil cap daily 

## 2020-10-20 NOTE — Assessment & Plan Note (Signed)
Supplement and monitor 

## 2020-10-20 NOTE — Assessment & Plan Note (Signed)
Patient encouraged to maintain heart healthy diet, regular exercise, adequate sleep. Consider daily probiotics. Take medications as prescribed. Labs ordered and reviewed. See HPI for further screening info

## 2020-10-20 NOTE — Assessment & Plan Note (Signed)
hgba1c acceptable, minimize simple carbs. Increase exercise as tolerated. Continue current meds. Follows with Dr Buddy Duty of Endocrinology

## 2020-10-20 NOTE — Assessment & Plan Note (Signed)
Well controlled, no changes to meds. Encouraged heart healthy diet such as the DASH diet and exercise as tolerated.  °

## 2020-10-20 NOTE — Progress Notes (Signed)
Patient ID: Lori Zamora, female    DOB: 11-Dec-1955  Age: 65 y.o. MRN: [id number]    Subjective:  Subjective  HPI Lori Zamora presents for comprehensive physical exam. She states that she is feeling well today. She denies  PAP Smear exam today and states that her OBGYN will check on her next visit. Denies any urinary problems. She has a hx of gastroparesis and still has symptoms but it has improved.  She notes that she has a better diet compared to the past. She notes that her BM are normal. She denies any abdominal pain, back pain, chest pain, SOB, fever, chills, cough, vaginal pain, rash, or dysuria.  She complains that she hit the back of her head on a steel object while sitting down on a train x 5 days ago. She denies any dizziness, HA, nausea or vomiting.   She reports that her oldest sister passed away on Christmas Eve. She recently dx of brain and kidney CA. She had a PMHx of HTN and strokes.    Review of Systems  Constitutional: Negative for chills, fatigue and fever.  HENT: Negative for congestion, ear pain, rhinorrhea, sinus pressure, sinus pain and sore throat.   Eyes: Negative for pain.  Respiratory: Negative for cough, chest tightness and shortness of breath.   Cardiovascular: Negative for chest pain, palpitations and leg swelling.  Gastrointestinal: Negative for abdominal pain, diarrhea, nausea and vomiting.  Endocrine: Negative for polydipsia.  Genitourinary: Negative for dysuria, flank pain, frequency, urgency and vaginal pain.  Musculoskeletal: Negative for back pain and neck pain.  Skin: Negative for rash.  Neurological: Negative for dizziness and headaches.    History Past Medical History:  Diagnosis Date  . Abdominal pain, unspecified site 11/29/2013  . Acquired acanthosis nigricans   . Anemia, unspecified   . 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 Bipolar 2 disorder ([place]) 01/31/2010   Qualifier: Diagnosis of  By: Fuller Plan CMA (AAMA), Terri Skains   Follows with Dr Letta Moynahan of psychiatry   . Bipolar disorder, unspecified ([place])   . CFS (chronic fatigue syndrome)   . Degeneration of intervertebral disc, site unspecified   . Depressive disorder, not elsewhere classified   . Esophageal reflux   . FH: breast cancer 05/20/2017  . Gastroparesis 06/07/2015  . Hyperglycemia 01/20/2018  . Intertriginous candidiasis 11/29/2013  . 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 Microcytic anemia 01/31/2010   Qualifier: Diagnosis of  By: Fuller Plan CMA (AAMA), [place]    . Migraine with aura, without mention of intractable migraine without mention of status migrainosus   . Myalgia and myositis, unspecified   . Nontoxic multinodular goiter   . Other and unspecified hyperlipidemia   . Other malaise and fatigue   . Other specified iron deficiency anemias   . Other thalassemia ([place])   . Parotid gland enlargement 2011   related to connective tissue syndrome  . Pelvic floor dysfunction 01/27/2013  . Peripheral edema 02/19/2017  . Preventative health care 09/12/2014  . Renal insufficiency 01/27/2013  . Sjogren's syndrome ([place])   . Small intestinal bacterial overgrowth 11/29/2013  . Systemic lupus erythematosus ([place])   . Thrombocytosis 06/07/2016  . Type II or unspecified type diabetes mellitus without mention of complication, not stated as uncontrolled   . Unspecified constipation   . Unspecified diffuse connective tissue disease 01/27/2013   Follows at [facility] rheumatology, Dr Alanda Amass Per patient has tested 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 antibody  . Unspecified essential hypertension   .  Unspecified sleep apnea   . Unspecified vitamin D deficiency     She has a past surgical history that includes Laparoscopic total hysterectomy (2004) and Biopsy thyroid.   Her family history includes Alcohol abuse in her brother and another family member; Allergies in her mother; Brain cancer in her sister; Cancer in her  sister; Cancer (age of onset: 94) in her sister; Diabetes in her mother; Heart disease in her maternal grandfather, maternal grandmother, and mother; Hypertension in her brother, father, mother, sister, and another family member; Kidney disease in an other family member; Obesity in her sister; Other in her father; Stroke in her sister and another family member.She reports that she has never smoked. She has never used smokeless tobacco. She reports that she does not drink alcohol and does not use drugs.  Current Outpatient Medications on File Prior to Visit  Medication Sig Dispense Refill  . alclomethasone (ACLOVATE) 0.05 % cream Apply 1 application topically 2 (two) times daily.    Marland Kitchen amLODipine (NORVASC) 5 MG tablet TAKE 1 TABLET(5 MG) BY MOUTH TWICE DAILY 180 tablet 1  . Ascorbic Acid (VITAMIN C) 1000 MG tablet Take 1 tablet (1,000 mg total) by mouth daily.    Marland Kitchen aspirin EC 81 MG tablet Take 1 tablet (81 mg total) by mouth daily.    Marland Kitchen atorvastatin (LIPITOR) 20 MG tablet TAKE 3 TABLETS BY MOUTH DAILY 270 tablet 1  . Blood Glucose Calibration (OT ULTRA/FASTTK CNTRL SOLN) SOLN Check Sugars prn  1  . cholecalciferol (VITAMIN D) 1000 UNITS tablet Take 2,000 Units by mouth daily.    . diphenhydrAMINE HCl, Sleep, 50 MG tablet Take by mouth.    . empagliflozin (JARDIANCE) 10 MG TABS tablet Take 10 mg by mouth daily.    . folic acid (FOLVITE) 1 MG tablet Take 1 tablet (1 mg total) by mouth daily. 90 tablet 3  . glucose blood test strip 1 each by Other route daily. One Touch Ultra    . ketoconazole (NIZORAL) 2 % cream APPLY EXTERNALLY TO THE AFFECTED AREA DAILY 60 g 2  . Lidocaine, Anorectal, 5 % CREA Apply topically as needed.    Marland Kitchen losartan (COZAAR) 25 MG tablet TAKE 1 TABLET(25 MG) BY MOUTH DAILY 90 tablet 1  . Magnesium Oxide 500 MG (LAX) TABS Take by mouth.    . Magnesium Oxide 500 MG TABS Take by mouth.    . metFORMIN (GLUCOPHAGE) 500 MG tablet Take 1,000 mg by mouth 2 (two) times daily with a meal.     . Mouthwashes (BIOTENE/CALCIUM PBF) LIQD by Transmucosal route.    . NON FORMULARY Lubricant eye ointment- sterile mineral oil 39.9% White Petroleum 57.7%    . NON FORMULARY Biotene dry mouth oral rinse    . omeprazole (PRILOSEC) 20 MG capsule Take 1 capsule (20 mg total) by mouth daily. 90 capsule 3  . Polyethyl Glycol-Propyl Glycol (SYSTANE) 0.4-0.3 % GEL ophthalmic gel Apply to eye.    Marland Kitchen PRESCRIPTION MEDICATION prevident 5000- dry mouth toothpaste    . Probiotic Product (DIGESTIVE ADVANTAGE GUMMIES PO) Take by mouth 2 (two) times daily.    Marland Kitchen RA SUNSCREEN SPF50 EX Apply topically as needed.    . sodium fluoride (FLUORISHIELD) 1.1 % GEL dental gel     . zinc gluconate 50 MG tablet Take 50 mg by mouth daily.     No current facility-administered medications on file prior to visit.     Objective:  Objective  Physical Exam Vitals and nursing note reviewed.  Constitutional:      General: She is not in acute distress.    Appearance: She is well-developed. She is not diaphoretic.  HENT:     Head: Normocephalic and atraumatic.      Right Ear: Tympanic membrane and external ear normal. There is impacted cerumen (mild).     Left Ear: Tympanic membrane and external ear normal. There is no impacted cerumen.     Nose: Nose normal.  Eyes:     General:        Right eye: No discharge.        Left eye: No discharge.     Conjunctiva/sclera: Conjunctivae normal.     Pupils: Pupils are equal, round, and reactive to light.  Neck:     Thyroid: No thyromegaly.     Vascular: No JVD.  Cardiovascular:     Rate and Rhythm: Normal rate and regular rhythm.     Pulses: Normal pulses.     Heart sounds: Normal heart sounds. No murmur heard.   Pulmonary:     Effort: Pulmonary effort is normal. No respiratory distress.     Breath sounds: Normal breath sounds. No wheezing or rales.  Chest:     Chest wall: No tenderness.  Abdominal:     General: Bowel sounds are normal. There is no distension.      Palpations: Abdomen is soft. There is no hepatomegaly, splenomegaly or mass.     Tenderness: There is no abdominal tenderness. There is no guarding or rebound.  Genitourinary:    Vagina: Normal. No vaginal discharge.     Rectum: Guaiac result negative.  Musculoskeletal:        General: No tenderness. Normal range of motion.     Cervical back: Normal range of motion and neck supple.     Right foot: No deformity, bunion or foot drop.     Left foot: No deformity, bunion or foot drop.  Feet:     Right foot:     Skin integrity: Skin integrity normal. No ulcer, erythema, callus or dry skin.     Left foot:     Skin integrity: Skin integrity normal. No ulcer, erythema, callus or dry skin.  Lymphadenopathy:     Cervical: No cervical adenopathy.  Skin:    General: Skin is warm and dry.     Findings: No erythema or rash.  Neurological:     Mental Status: She is alert and oriented to person, place, and time.     Cranial Nerves: No cranial nerve deficit.     Deep Tendon Reflexes: Reflexes are normal and symmetric.  Psychiatric:        Behavior: Behavior normal.        Thought Content: Thought content normal.        Judgment: Judgment normal.    BP 112/64   Pulse 91   Temp 98.2 F (36.8 C)   Resp 16   Ht 5\' 5"  (1.651 m)   Wt 168 lb 12.8 oz (76.6 kg)   SpO2 98%   BMI 28.09 kg/m  Wt Readings from Last 3 Encounters:  10/20/20 168 lb 12.8 oz (76.6 kg)  04/21/20 172 lb 6.4 oz (78.2 kg)  10/16/19 177 lb 9.6 oz (80.6 kg)     Lab Results  Component Value Date   WBC 7.0 04/21/2020   HGB 13.3 04/21/2020   HCT 43.9 04/21/2020   PLT 483 (H) 04/21/2020   GLUCOSE 91 04/21/2020   CHOL 148 04/21/2020   TRIG  106 04/21/2020   HDL 39 (L) 04/21/2020   LDLDIRECT 95 12/22/2009   LDLCALC 89 04/21/2020   ALT 14 04/21/2020   AST 12 04/21/2020   NA 142 04/21/2020   K 4.7 04/21/2020   CL 104 04/21/2020   CREATININE 0.83 04/21/2020   BUN 12 04/21/2020   CO2 26 04/21/2020   TSH 1.59  04/21/2020   HGBA1C 6.6 (H) 04/21/2020   MICROALBUR 1.6 10/16/2019    MM 3D SCREEN BREAST BILATERAL  Result Date: 11/23/2019 CLINICAL DATA:  Screening. EXAM: DIGITAL SCREENING BILATERAL MAMMOGRAM WITH TOMO AND CAD COMPARISON:  Previous exam(s). ACR Breast Density Category b: There are scattered areas of fibroglandular density. FINDINGS: There are no findings suspicious for malignancy. Images were processed with CAD. IMPRESSION: No mammographic evidence of malignancy. A result letter of this screening mammogram will be mailed directly to the patient. RECOMMENDATION: Screening mammogram in one year. (Code:SM-B-01Y) BI-RADS CATEGORY  1: Negative. Electronically Signed   By: Audie Pinto M.D.   On: 11/23/2019 11:37     Assessment & Plan:  Plan    No orders of the defined types were placed in this encounter.   Problem List Items Addressed This Visit    Vitamin D deficiency - Primary    Supplement and monitor      Relevant Orders   Vitamin D 1,25 dihydroxy   Hyperlipidemia, mixed    Encouraged heart healthy diet, increase exercise, avoid trans fats, consider a krill oil cap daily      Relevant Orders   Lipid panel   Other iron deficiency anemias   Microcytic anemia   Essential hypertension    Well controlled, no changes to meds. Encouraged heart healthy diet such as the DASH diet and exercise as tolerated.       Relevant Orders   CBC with Differential/Platelet   Comprehensive metabolic panel   TSH   Diffuse connective tissue disease ([place])    No recent flares or concerns.      Renal insufficiency    Hydrate and monitor      Relevant Orders   Comprehensive metabolic panel   Colon cancer screening    Colonoscopy 2019, repeat in 10 years      Elevated sed rate    Repeat with labs      Relevant Orders   Sedimentation rate   Diabetes mellitus ([place])    hgba1c acceptable, minimize simple carbs. Increase exercise as tolerated. Continue current meds. Follows with Dr  Buddy Duty of Endocrinology      Relevant Orders   Hemoglobin A1c   Preventative health care    Patient encouraged to maintain heart healthy diet, regular exercise, adequate sleep. Consider daily probiotics. Take medications as prescribed. Labs ordered and reviewed. See HPI for further screening info       DEXA, PAP Smear, mamm, colonoscopy   Colonoscopy: completed in 2019, no polyps or adenoma was found, repeat in 10 years  Dexa- completed in 08/01/2018, repeat in 3 years(08/01/2022)  Mamm-11/23/2019, repeat in 1-2 years   PAP smear-she denies today, plans to complete at OBGYN   Follow-up: Return in about 6 months (around 04/22/2021).   I,Alexis Bryant,acting as a Education administrator for [name], MD.,have documented all relevant documentation on the behalf of [name], MD,as directed by  [name], MD while in the presence of [name], MD. I, [name], MD, have reviewed all documentation for this visit. The documentation on 10/20/20 for the exam, diagnosis, procedures, and orders are all accurate  and complete.

## 2020-10-24 LAB — VITAMIN D 1,25 DIHYDROXY
Vitamin D 1, 25 (OH)2 Total: 52 pg/mL (ref 18–72)
Vitamin D2 1, 25 (OH)2: <8 pg/mL
Vitamin D3 1, 25 (OH)2: 52 pg/mL

## 2020-10-30 ENCOUNTER — Other Ambulatory Visit: Payer: Self-pay | Admitting: Family Medicine

## 2020-11-11 ENCOUNTER — Other Ambulatory Visit (HOSPITAL_BASED_OUTPATIENT_CLINIC_OR_DEPARTMENT_OTHER): Payer: Self-pay | Admitting: Family Medicine

## 2020-11-11 DIAGNOSIS — Z1231 Encounter for screening mammogram for malignant neoplasm of breast: Secondary | ICD-10-CM

## 2020-11-20 ENCOUNTER — Other Ambulatory Visit: Payer: Self-pay | Admitting: Family Medicine

## 2020-11-24 ENCOUNTER — Encounter (HOSPITAL_BASED_OUTPATIENT_CLINIC_OR_DEPARTMENT_OTHER): Payer: Self-pay

## 2020-11-24 ENCOUNTER — Other Ambulatory Visit: Payer: Self-pay

## 2020-11-24 ENCOUNTER — Ambulatory Visit (HOSPITAL_BASED_OUTPATIENT_CLINIC_OR_DEPARTMENT_OTHER)
Admission: RE | Admit: 2020-11-24 | Discharge: 2020-11-24 | Disposition: A | Discharge status: Home / Self Care | Payer: BC Managed Care – PPO | Source: Ambulatory Visit | Attending: Family Medicine | Admitting: Family Medicine

## 2020-11-24 DIAGNOSIS — Z1231 Encounter for screening mammogram for malignant neoplasm of breast: Secondary | ICD-10-CM | POA: Insufficient documentation

## 2020-12-07 ENCOUNTER — Other Ambulatory Visit: Payer: Self-pay | Admitting: Family Medicine

## 2021-03-24 NOTE — Progress Notes (Signed)
[facility] [address], [place], [place] [address] [id number] [phone]  Date:  03/27/2021   Name:  Lori Zamora   DOB:  02-19-1956   MRN:  [id number]  PCP:  Mosie Lukes, MD    Chief Complaint: Cyst (Left shoulder , onset: gradual -- redness )   History of Present Illness:  Lori Zamora is a 65 y.o. very pleasant female patient who presents with the following:  Primary pt of Dr Charlett Blake here today with concern about a cyst on her shoulder History of diabetes, HTN, thalassemia, sjogrens' syndrome  She had noted a small spot on her left shoulder- she had planned to have derm see it but then covid came so this was delayed About 10 days ago it got a lot larger and become painful It is right under her bra strap and is tender  She did call derm but they cannot see her for months   She is otherwise feeling well, no fever or other systemic sx     Patient Active Problem List   Diagnosis Date Noted   Preventative health care 07/24/2018   Proteinuria 07/24/2018   FH: breast cancer 05/20/2017   Peripheral edema 02/19/2017   ASD (atrial septal defect) 02/19/2017   Diabetes mellitus ([place]) 02/19/2017   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   Elevated sed rate 09/09/2016   Thrombocytosis 06/07/2016   Gastroparesis 06/07/2015   Colon cancer screening 09/12/2014   Dermatitis 03/22/2014   Antimitochondrial antibody positive 11/29/2013   Abdominal pain, unspecified site 11/29/2013   Sinusitis 09/21/2013   Elevated alkaline phosphatase level 05/04/2013   Diffuse connective tissue disease ([place]) 01/27/2013   Renal insufficiency 01/27/2013   Pelvic floor dysfunction 01/27/2013   Nontoxic multinodular goiter 04/17/2012   Dyspnea 08/31/2011   Insomnia 07/10/2011   Shoulder pain 02/25/2011   Parotid gland enlargement    Sjogren's syndrome ([place])    PRURITUS, ANAL 05/15/2010   Vitamin D deficiency 01/31/2010    Hyperlipidemia, mixed 01/31/2010   Other iron deficiency anemias 01/31/2010   Other thalassemia ([place]) 01/31/2010   Microcytic anemia 01/31/2010   MIGRAINE W/AURA W/O INTRACT W/O STATUS MIGRNOSUS 01/31/2010   Essential hypertension 01/31/2010   Constipation 01/31/2010   ACANTHOSIS NIGRICANS 01/31/2010   DEGENERATIVE DISC DISEASE 01/31/2010   Fibromyalgia 01/31/2010   SLEEP APNEA 01/31/2010   FATIGUE, CHRONIC 01/31/2010    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   Bipolar 2 disorder ([place]) 01/31/2010   Qualifier: Diagnosis of  By: Fuller Plan CMA (AAMA), Lugene  Follows with Dr Letta Moynahan of psychiatry    Bipolar disorder, unspecified ([place])    CFS (chronic fatigue syndrome)    Degeneration of intervertebral disc, site unspecified    Depressive disorder, not elsewhere classified    Esophageal reflux    FH: breast cancer 05/20/2017   Gastroparesis 06/07/2015   Hyperglycemia 01/20/2018   Intertriginous candidiasis 11/29/2013   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   Microcytic anemia 01/31/2010   Qualifier: Diagnosis of  By: Fuller Plan CMA (AAMA), Lugene     Migraine with aura, without mention of intractable migraine without mention of status migrainosus    Myalgia and myositis, unspecified    Nontoxic multinodular goiter    Other and unspecified hyperlipidemia    Other malaise and fatigue    Other specified  iron deficiency anemias    Other thalassemia ([place])    Parotid gland enlargement 2011   related to connective tissue syndrome   Pelvic floor dysfunction 01/27/2013   Peripheral edema 02/19/2017   Preventative health care 09/12/2014   Renal insufficiency 01/27/2013   Sjogren's syndrome ([place])    Small intestinal bacterial overgrowth 11/29/2013   Systemic lupus erythematosus ([place])    Thrombocytosis 06/07/2016   Type II or unspecified type  diabetes mellitus without mention of complication, not stated as uncontrolled    Unspecified constipation    Unspecified diffuse connective tissue disease 01/27/2013   Follows at [facility] rheumatology, Dr Alanda Amass Per patient has tested 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 antibody   Unspecified essential hypertension    Unspecified sleep apnea    Unspecified vitamin D deficiency     Past Surgical History:  Procedure Laterality Date   BIOPSY THYROID     01/13/2015, 3 previous biopsy reported all benign   LAPAROSCOPIC TOTAL HYSTERECTOMY  2004    Social History   Tobacco Use   Smoking status: Never   Smokeless tobacco: Never  Substance Use Topics   Alcohol use: No   Drug use: No    Family History  Problem Relation Age of Onset   Diabetes Mother    Heart disease Mother        family history   Allergies Mother    Hypertension Mother    Alcohol abuse Other        Family history of alcoholism and addiction   Hypertension Father    Other Father        viral meningitis   Alcohol abuse Brother        drug abuse   Hypertension Brother    Stroke Sister        68 in 2019   Hypertension Sister    Brain cancer Sister        glioblastoma   Cancer Sister        renal cancer   Cancer Sister 26       breast   Breast cancer Sister 82   Heart disease Maternal Grandmother    Heart disease Maternal Grandfather    Hypertension Other        family history   Kidney disease Other        family history   Stroke Other        1st degree relative <60    Allergies  Allergen Reactions   Penicillins Swelling and Anaphylaxis    Swelling of tongue.   Sulfa Antibiotics Other (See Comments) and Rash    Other Reaction: swellin of tongue Other Reaction: swellin of tongue   Sulfonamide Derivatives Swelling    REACTION: Swelling of tongue and face   Acetazolamide    Azathioprine Other (See Comments)    Very low TPMT in deficient range Unknown    Ezetimibe Other (See Comments)    Unknown   Lisinopril Other (See Comments)    Unknown   Meloxicam Other (See Comments)    Unknown   Pravastatin Sodium Other (See Comments)    Unknown   Simvastatin Other (See Comments)    Unknown   Latex Hives and Rash    Medication list has been reviewed and updated.  Current Outpatient Medications on File Prior to Visit  Medication Sig Dispense Refill   alclomethasone (ACLOVATE) 0.05 % cream Apply 1 application topically 2 (two) times daily.  amLODipine (NORVASC) 5 MG tablet TAKE 1 TABLET(5 MG) BY MOUTH TWICE DAILY 180 tablet 1   Ascorbic Acid (VITAMIN C) 1000 MG tablet Take 1 tablet (1,000 mg total) by mouth daily.     aspirin EC 81 MG tablet Take 1 tablet (81 mg total) by mouth daily.     atorvastatin (LIPITOR) 20 MG tablet Take 3 tablets (60 mg total) by mouth daily. 270 tablet 1   Blood Glucose Calibration (OT ULTRA/FASTTK CNTRL SOLN) SOLN Check Sugars prn  1   cholecalciferol (VITAMIN D) 1000 UNITS tablet Take 2,000 Units by mouth daily.     diphenhydrAMINE HCl, Sleep, 50 MG tablet Take by mouth.     empagliflozin (JARDIANCE) 10 MG TABS tablet Take 10 mg by mouth daily.     folic acid (FOLVITE) 1 MG tablet TAKE 1 TABLET(1 MG) BY MOUTH DAILY 90 tablet 3   glucose blood test strip 1 each by Other route daily. One Touch Ultra     ketoconazole (NIZORAL) 2 % cream APPLY EXTERNALLY TO THE AFFECTED AREA DAILY 60 g 2   Lidocaine, Anorectal, 5 % CREA Apply topically as needed.     losartan (COZAAR) 25 MG tablet Take 1 tablet (25 mg total) by mouth daily. 90 tablet 1   Magnesium Oxide 500 MG (LAX) TABS Take by mouth.     Magnesium Oxide 500 MG TABS Take by mouth.     metFORMIN (GLUCOPHAGE) 500 MG tablet Take 1,000 mg by mouth 2 (two) times daily with a meal.     Mouthwashes (BIOTENE/CALCIUM PBF) LIQD by Transmucosal route.     NON FORMULARY Lubricant eye ointment- sterile mineral oil 39.9% White Petroleum 57.7%     NON FORMULARY Biotene dry mouth  oral rinse     omeprazole (PRILOSEC) 20 MG capsule Take 1 capsule (20 mg total) by mouth daily. 90 capsule 3   Polyethyl Glycol-Propyl Glycol (SYSTANE) 0.4-0.3 % GEL ophthalmic gel Apply to eye.     PRESCRIPTION MEDICATION prevident 5000- dry mouth toothpaste     Probiotic Product (DIGESTIVE ADVANTAGE GUMMIES PO) Take by mouth 2 (two) times daily.     RA SUNSCREEN SPF50 EX Apply topically as needed.     sodium fluoride (FLUORISHIELD) 1.1 % GEL dental gel      zinc gluconate 50 MG tablet Take 50 mg by mouth daily.     No current facility-administered medications on file prior to visit.    Review of Systems:  As per HPI- otherwise negative.   Physical Examination: Vitals:   03/27/21 0826  BP: 108/60  Pulse: 94  Resp: 18  Temp: 98.8 F (37.1 C)  SpO2: 97%   Vitals:   03/27/21 0826  Weight: 169 lb 9.6 oz (76.9 kg)  Height: '5\' 5"'$  (1.651 m)   Body mass index is 28.22 kg/m. Ideal Body Weight:   GEN: No acute distress; alert,appropriate. PULM: Breathing comfortably in no respiratory distress PSYCH: Normally interactive.  Looks well There is a sebaceous cyst overlying the mid left clavicle, superior aspect of the bone.  Approx 2cm diameter, It is tender and slightly inflamed with visible pore  VC obtained.  Area prepped with betadine.  Used approx 1.5cc of 1% lido with epi for anesthesia.  Incised over cyst with care to keep incision very superficial (just through dermis) given location.  Pus and sebaceous material drained and expressed with gentle squeezing.  Wound gently probed with forceps for any pockets.  Applied band- aid.  EBL 41m, hemostatic after procedure  Pt tolerated well with no immediate complications.     Assessment and Plan: Infected sebaceous cyst of skin I and D of cyst as above.  Written wound care instructions provided.  She will let us know if any concerns  This visit occurred during the SARS-CoV-2 public health emergency.  Safety protocols were in place,  including screening questions prior to the visit, additional usage of staff PPE, and extensive cleaning of exam room while observing appropriate contact time as indicated for disinfecting solutions.   Signed Lamar Blinks, MD

## 2021-03-27 ENCOUNTER — Ambulatory Visit: Payer: BC Managed Care – PPO | Admitting: Family Medicine

## 2021-03-27 ENCOUNTER — Other Ambulatory Visit: Payer: Self-pay

## 2021-03-27 VITALS — BP 108/60 | HR 94 | Temp 98.8°F | Resp 18 | Ht 65.0 in | Wt 169.6 lb

## 2021-03-27 DIAGNOSIS — L089 Local infection of the skin and subcutaneous tissue, unspecified: Secondary | ICD-10-CM

## 2021-03-27 DIAGNOSIS — L723 Sebaceous cyst: Secondary | ICD-10-CM

## 2021-03-27 NOTE — Patient Instructions (Signed)
We drained an infected sebaceous cyst on your shoulder today  Keep the area clean and dry today.  If the bandage gets bloody ok to change it.  If any bleeding, apply pressure for 10- 15 minutes.  If not resolved please call us or otherwise seek care!  Starting tomorrow ok to shower and wash area with soap and water as usual, apply pressure around area to squeeze out any other sebaceous (thick white) material which may build up.  Keep covered with a band- aid as needed, until healed over.     Let me know if any questions or concerns.  This sort of cyst may come back and need to be removed surgically.  Let us know if this should occur

## 2021-04-25 ENCOUNTER — Other Ambulatory Visit: Payer: Self-pay

## 2021-04-25 ENCOUNTER — Ambulatory Visit: Payer: BC Managed Care – PPO | Admitting: Family Medicine

## 2021-04-25 ENCOUNTER — Encounter: Payer: Self-pay | Admitting: Family Medicine

## 2021-04-25 VITALS — BP 114/62 | HR 99 | Temp 97.8°F | Resp 16 | Wt 171.2 lb

## 2021-04-25 DIAGNOSIS — L309 Dermatitis, unspecified: Secondary | ICD-10-CM

## 2021-04-25 DIAGNOSIS — N289 Disorder of kidney and ureter, unspecified: Secondary | ICD-10-CM

## 2021-04-25 DIAGNOSIS — D509 Iron deficiency anemia, unspecified: Secondary | ICD-10-CM | POA: Diagnosis not present

## 2021-04-25 DIAGNOSIS — E1159 Type 2 diabetes mellitus with other circulatory complications: Secondary | ICD-10-CM | POA: Diagnosis not present

## 2021-04-25 DIAGNOSIS — R700 Elevated erythrocyte sedimentation rate: Secondary | ICD-10-CM

## 2021-04-25 DIAGNOSIS — E559 Vitamin D deficiency, unspecified: Secondary | ICD-10-CM | POA: Diagnosis not present

## 2021-04-25 DIAGNOSIS — E782 Mixed hyperlipidemia: Secondary | ICD-10-CM | POA: Diagnosis not present

## 2021-04-25 DIAGNOSIS — K3184 Gastroparesis: Secondary | ICD-10-CM

## 2021-04-25 DIAGNOSIS — I10 Essential (primary) hypertension: Secondary | ICD-10-CM | POA: Diagnosis not present

## 2021-04-25 LAB — CBC WITH DIFFERENTIAL/PLATELET
Basophils Absolute: 0 10*3/uL (ref 0.0–0.1)
Basophils Relative: 0.5 % (ref 0.0–3.0)
Eosinophils Absolute: 0.1 10*3/uL (ref 0.0–0.7)
Eosinophils Relative: 1 % (ref 0.0–5.0)
HCT: 38.3 % (ref 36.0–46.0)
Hemoglobin: 11.8 g/dL — ABNORMAL LOW (ref 12.0–15.0)
Lymphocytes Relative: 30.8 % (ref 12.0–46.0)
Lymphs Abs: 2.1 10*3/uL (ref 0.7–4.0)
MCHC: 30.8 g/dL (ref 30.0–36.0)
MCV: 78.5 fl (ref 78.0–100.0)
Monocytes Absolute: 0.4 10*3/uL (ref 0.1–1.0)
Monocytes Relative: 5.7 % (ref 3.0–12.0)
Neutro Abs: 4.2 10*3/uL (ref 1.4–7.7)
Neutrophils Relative %: 62 % (ref 43.0–77.0)
Platelets: 463 10*3/uL — ABNORMAL HIGH (ref 150.0–400.0)
RBC: 4.88 Mil/uL (ref 3.87–5.11)
RDW: 18.2 % — ABNORMAL HIGH (ref 11.5–15.5)
WBC: 6.7 10*3/uL (ref 4.0–10.5)

## 2021-04-25 LAB — COMPREHENSIVE METABOLIC PANEL
ALT: 12 U/L (ref 0–35)
AST: 10 U/L (ref 0–37)
Albumin: 4.3 g/dL (ref 3.5–5.2)
Alkaline Phosphatase: 106 U/L (ref 39–117)
BUN: 12 mg/dL (ref 6–23)
CO2: 29 mEq/L (ref 19–32)
Calcium: 9.9 mg/dL (ref 8.4–10.5)
Chloride: 103 mEq/L (ref 96–112)
Creatinine, Ser: 0.78 mg/dL (ref 0.40–1.20)
GFR: 80.37 mL/min (ref >60.00)
Glucose, Bld: 90 mg/dL (ref 70–99)
Potassium: 4.8 mEq/L (ref 3.5–5.1)
Sodium: 141 mEq/L (ref 135–145)
Total Bilirubin: 0.4 mg/dL (ref 0.2–1.2)
Total Protein: 6.9 g/dL (ref 6.0–8.3)

## 2021-04-25 LAB — LIPID PANEL
Cholesterol: 154 mg/dL (ref 0–200)
HDL: 41.7 mg/dL (ref >39.00)
LDL Cholesterol: 88 mg/dL (ref 0–99)
NonHDL: 112.49
Total CHOL/HDL Ratio: 4
Triglycerides: 123 mg/dL (ref 0.0–149.0)
VLDL: 24.6 mg/dL (ref 0.0–40.0)

## 2021-04-25 LAB — TSH: TSH: 1.02 u[IU]/mL (ref 0.35–5.50)

## 2021-04-25 LAB — HEMOGLOBIN A1C: Hgb A1c MFr Bld: 7.2 % — ABNORMAL HIGH (ref 4.6–6.5)

## 2021-04-25 LAB — SEDIMENTATION RATE: Sed Rate: 34 mm/hr — ABNORMAL HIGH (ref 0–30)

## 2021-04-25 MED ORDER — KETOCONAZOLE 2 % EX CREA: TOPICAL_CREAM | CUTANEOUS | 2 refills | Status: DC

## 2021-04-25 NOTE — Patient Instructions (Signed)
  Paxlovid is the new COVID medication we can give you if you get COVID so make sure you test if you have symptoms because we have to treat by day 5 of symptoms for it to be effective. If you are positive let us know so we can treat. If a home test is negative and your symptoms are persistent get a PCR test. Can check testing locations at [email or web address] If you are positive we will make an appointment with [place] and we will send in Paxlovid if you would like it. Check with your pharmacy before we meet to confirm they have it in stock, if they do not then we can get the prescription at the [facility]

## 2021-04-25 NOTE — Progress Notes (Signed)
Subjective:   By signing my name below, I, Burnett Corrente, attest that this documentation has been prepared under the direction and in the presence of [name], MD  04/25/2021   Patient ID: Lori Zamora, female    DOB: April 03, 1956, 65 y.o.   MRN: [id number]  Chief Complaint  Patient presents with   6 months f/u    Would like a cyst on left shoulder looked at    HPI Patient is in today for office visit for chronic medical concerns.   Today, patient feels way better than last visit due to cyst on left shoulder but has improved. Had COVID that delayed the appointment with dermatologist. She had a couple visits with gynecologist, Molli Posey, for gynecologic examinations. She had a pap smear completed on 03/07/2016 and mammogram completed 11/24/2020 which were both normal. Colonoscopy completed on 03/06/2018 and vision exam was completed 08/04/2020. She opted out of flu vaccination at this time. She included her eyes has been itchy recently.  Patient denies any headaches, chest pains, palpitations, congestion, fever, of GI or GU c/o.   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   Bipolar 2 disorder ([place]) 01/31/2010   Qualifier: Diagnosis of  By: Fuller Plan CMA (AAMA), [place]  Follows with Dr Letta Moynahan of psychiatry    Bipolar disorder, unspecified ([place])    CFS (chronic fatigue syndrome)    Degeneration of intervertebral disc, site unspecified    Depressive disorder, not elsewhere classified    Esophageal reflux    FH: breast cancer 05/20/2017   Gastroparesis 06/07/2015   Hyperglycemia 01/20/2018   Intertriginous candidiasis 11/29/2013   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   Microcytic anemia 01/31/2010   Qualifier: Diagnosis of  By: Fuller Plan CMA (AAMA), [place]     Migraine with aura, without mention of intractable migraine  without mention of status migrainosus    Myalgia and myositis, unspecified    Nontoxic multinodular goiter    Other and unspecified hyperlipidemia    Other malaise and fatigue    Other specified iron deficiency anemias    Other thalassemia ([place])    Parotid gland enlargement 2011   related to connective tissue syndrome   Pelvic floor dysfunction 01/27/2013   Peripheral edema 02/19/2017   Preventative health care 09/12/2014   Renal insufficiency 01/27/2013   Sjogren's syndrome ([place])    Small intestinal bacterial overgrowth 11/29/2013   Systemic lupus erythematosus ([place])    Thrombocytosis 06/07/2016   Type II or unspecified type diabetes mellitus without mention of complication, not stated as uncontrolled    Unspecified constipation    Unspecified diffuse connective tissue disease 01/27/2013   Follows at [facility] rheumatology, Dr Alanda Amass Per patient has tested 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 antibody   Unspecified essential hypertension    Unspecified sleep apnea    Unspecified vitamin D deficiency     Past Surgical History:  Procedure Laterality Date   BIOPSY THYROID     01/13/2015, 3 previous biopsy reported all benign   LAPAROSCOPIC TOTAL HYSTERECTOMY  2004    Family History  Problem Relation Age of Onset   Diabetes Mother    Heart disease Mother        family history   Allergies Mother    Hypertension Mother    Alcohol abuse Other  Family history of alcoholism and addiction   Hypertension Father    Other Father        viral meningitis   Alcohol abuse Brother        drug abuse   Hypertension Brother    Stroke Sister        4 in 2019   Hypertension Sister    Brain cancer Sister        glioblastoma   Cancer Sister        renal cancer   Cancer Sister 61       breast   Breast cancer Sister 53   Heart disease Maternal Grandmother    Heart disease Maternal Grandfather    Hypertension Other        family history    Kidney disease Other        family history   Stroke Other        1st degree relative <60    Social History   Socioeconomic History   Marital status: Single    Spouse name: Not on file   Number of children: 0   Years of education: Not on file   Highest education level: Not on file  Occupational History   Occupation: Pharmacist, hospital  Tobacco Use   Smoking status: Never   Smokeless tobacco: Never  Vaping Use   Vaping Use: Not on file  Substance and Sexual Activity   Alcohol use: No   Drug use: No   Sexual activity: Never    Birth control/protection: None    Comment: lives alone, no major dietary restrictions, retired from teaching kindergarten  Other Topics Concern   Not on file  Social History Narrative   Former Pharmacist, hospital, on disability   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     amLODipine (NORVASC) 5 MG tablet TAKE 1 TABLET(5 MG) BY MOUTH TWICE DAILY 180 tablet 1   Ascorbic Acid (VITAMIN C) 1000 MG tablet Take 1 tablet (1,000 mg total) by mouth daily.     aspirin EC 81 MG tablet Take 1 tablet (81 mg total) by mouth daily.     atorvastatin (LIPITOR) 20 MG tablet Take 3 tablets (60 mg total) by mouth daily. 270 tablet 1   Blood Glucose Calibration (OT ULTRA/FASTTK CNTRL SOLN) SOLN Check Sugars prn  1   cholecalciferol (VITAMIN D) 1000 UNITS tablet Take 2,000 Units by mouth daily.     diphenhydrAMINE HCl, Sleep, 50 MG tablet Take by mouth.     empagliflozin (JARDIANCE) 10 MG TABS tablet Take 10 mg by mouth daily.     folic acid (FOLVITE) 1 MG tablet TAKE 1 TABLET(1  MG) BY MOUTH DAILY 90 tablet 3   glucose blood test strip 1 each by Other route daily. One Touch Ultra     Lidocaine, Anorectal, 5 % CREA Apply topically as needed.     losartan (COZAAR) 25 MG tablet Take 1 tablet (25 mg total) by mouth daily. 90 tablet 1   Magnesium Oxide 500 MG (LAX) TABS Take by mouth.  Magnesium Oxide 500 MG TABS Take by mouth.     metFORMIN (GLUCOPHAGE) 500 MG tablet Take 1,000 mg by mouth 2 (two) times daily with a meal.     Mouthwashes (BIOTENE/CALCIUM PBF) LIQD by Transmucosal route.     NON FORMULARY Lubricant eye ointment- sterile mineral oil 39.9% White Petroleum 57.7%     NON FORMULARY Biotene dry mouth oral rinse     omeprazole (PRILOSEC) 20 MG capsule Take 1 capsule (20 mg total) by mouth daily. 90 capsule 3   Polyethyl Glycol-Propyl Glycol (SYSTANE) 0.4-0.3 % GEL ophthalmic gel Apply to eye.     PRESCRIPTION MEDICATION prevident 5000- dry mouth toothpaste     Probiotic Product (DIGESTIVE ADVANTAGE GUMMIES PO) Take by mouth 2 (two) times daily.     RA SUNSCREEN SPF50 EX Apply topically as needed.     sodium fluoride (FLUORISHIELD) 1.1 % GEL dental gel      zinc gluconate 50 MG tablet Take 50 mg by mouth daily.     ketoconazole (NIZORAL) 2 % cream APPLY EXTERNALLY TO THE AFFECTED AREA DAILY 60 g 2   No facility-administered medications prior to visit.    Allergies  Allergen Reactions   Penicillins Swelling and Anaphylaxis    Swelling of tongue.   Sulfa Antibiotics Other (See Comments) and Rash    Other Reaction: swellin of tongue Other Reaction: swellin of tongue   Sulfonamide Derivatives Swelling    REACTION: Swelling of tongue and face   Acetazolamide    Azathioprine Other (See Comments)    Very low TPMT in deficient range Unknown   Ezetimibe Other (See Comments)    Unknown   Lisinopril Other (See Comments)    Unknown   Meloxicam Other (See Comments)    Unknown   Pravastatin Sodium Other (See Comments)    Unknown   Simvastatin Other (See  Comments)    Unknown   Latex Hives and Rash    Review of Systems  Constitutional:  Negative for chills, fever, malaise/fatigue and weight loss.  HENT:  Negative for congestion, ear pain, sinus pain and sore throat.   Eyes:  Negative for pain and redness.  Respiratory:  Negative for cough, sputum production, shortness of breath and wheezing.   Cardiovascular:  Negative for chest pain, palpitations, orthopnea and leg swelling.  Gastrointestinal:  Negative for abdominal pain, blood in stool, constipation, diarrhea, nausea and vomiting.  Genitourinary:  Negative for dysuria and urgency.  Musculoskeletal:  Negative for joint pain, myalgias and neck pain.  Skin:  Positive for itching (ear itching). Negative for rash.  Neurological:  Negative for dizziness, weakness and headaches.  Psychiatric/Behavioral:  Negative for depression. The patient is not nervous/anxious and does not have insomnia.       Objective:    Physical Exam Constitutional:      Appearance: Normal appearance.  HENT:     Head: Normocephalic and atraumatic.     Right Ear: Tympanic membrane and external ear normal.     Left Ear: Tympanic membrane and external ear normal.  Eyes:     General: No scleral icterus.    Extraocular Movements: Extraocular movements intact.     Pupils: Pupils are equal, round, and reactive to light.  Cardiovascular:     Rate and Rhythm: Normal rate and regular rhythm.     Pulses: Normal pulses.     Heart sounds: Normal heart sounds. No murmur heard.   No gallop.  Pulmonary:     Effort: Pulmonary effort is normal.  Breath sounds: Normal breath sounds. No wheezing.  Abdominal:     General: Abdomen is flat.     Palpations: Abdomen is soft. There is no mass.     Tenderness: There is no abdominal tenderness.  Musculoskeletal:        General: Normal range of motion.     Cervical back: Normal range of motion and neck supple. No tenderness.     Right lower leg: No edema.     Left lower leg:  No edema.  Lymphadenopathy:     Cervical: No cervical adenopathy.  Skin:    General: Skin is warm and dry.     Findings: No rash.  Neurological:     Mental Status: She is alert and oriented to person, place, and time.     Motor: No weakness.     Deep Tendon Reflexes: Reflexes normal.  Psychiatric:        Mood and Affect: Mood normal.        Behavior: Behavior normal.    BP 114/62   Pulse 99   Temp 97.8 F (36.6 C)   Resp 16   Wt 171 lb 3.2 oz (77.7 kg)   SpO2 96%   BMI 28.49 kg/m  Wt Readings from Last 3 Encounters:  04/25/21 171 lb 3.2 oz (77.7 kg)  03/27/21 169 lb 9.6 oz (76.9 kg)  10/20/20 168 lb 12.8 oz (76.6 kg)    Diabetic Foot Exam - Simple   No data filed    Lab Results  Component Value Date   WBC 6.7 04/25/2021   HGB 11.8 (L) 04/25/2021   HCT 38.3 04/25/2021   PLT 463.0 (H) 04/25/2021   GLUCOSE 90 04/25/2021   CHOL 154 04/25/2021   TRIG 123.0 04/25/2021   HDL 41.70 04/25/2021   LDLDIRECT 95 12/22/2009   LDLCALC 88 04/25/2021   ALT 12 04/25/2021   AST 10 04/25/2021   NA 141 04/25/2021   K 4.8 04/25/2021   CL 103 04/25/2021   CREATININE 0.78 04/25/2021   BUN 12 04/25/2021   CO2 29 04/25/2021   TSH 1.02 04/25/2021   HGBA1C 7.2 (H) 04/25/2021   MICROALBUR 1.6 10/16/2019    Lab Results  Component Value Date   TSH 1.02 04/25/2021   Lab Results  Component Value Date   WBC 6.7 04/25/2021   HGB 11.8 (L) 04/25/2021   HCT 38.3 04/25/2021   MCV 78.5 04/25/2021   PLT 463.0 (H) 04/25/2021   Lab Results  Component Value Date   NA 141 04/25/2021   K 4.8 04/25/2021   CO2 29 04/25/2021   GLUCOSE 90 04/25/2021   BUN 12 04/25/2021   CREATININE 0.78 04/25/2021   BILITOT 0.4 04/25/2021   ALKPHOS 106 04/25/2021   AST 10 04/25/2021   ALT 12 04/25/2021   PROT 6.9 04/25/2021   ALBUMIN 4.3 04/25/2021   CALCIUM 9.9 04/25/2021   ANIONGAP 10 08/22/2018   GFR 80.37 04/25/2021   Lab Results  Component Value Date   CHOL 154 04/25/2021   Lab Results   Component Value Date   HDL 41.70 04/25/2021   Lab Results  Component Value Date   LDLCALC 88 04/25/2021   Lab Results  Component Value Date   TRIG 123.0 04/25/2021   Lab Results  Component Value Date   CHOLHDL 4 04/25/2021   Lab Results  Component Value Date   HGBA1C 7.2 (H) 04/25/2021       Assessment & Plan:   Problem List Items Addressed This  Visit     Vitamin D deficiency - Primary    Supplement and monitor      Relevant Orders   Vitamin D 1,25 dihydroxy   Hyperlipidemia, mixed    Encourage heart healthy diet such as MIND or DASH diet, increase exercise, avoid trans fats, simple carbohydrates and processed foods, consider a krill or fish or flaxseed oil cap daily.       Relevant Orders   CBC with Differential/Platelet (Completed)   Comprehensive metabolic panel (Completed)   Lipid panel (Completed)   TSH (Completed)   Microcytic anemia   Relevant Orders   Vitamin D 1,25 dihydroxy   Essential hypertension    Well controlled, no changes to meds. Encouraged heart healthy diet such as the DASH diet and exercise as tolerated.       Relevant Orders   CBC with Differential/Platelet (Completed)   Comprehensive metabolic panel (Completed)   Lipid panel (Completed)   TSH (Completed)   Renal insufficiency    Hydrate and monitor      Dermatitis    Uses small amounts of Ketoconazole infrequently when dermatitis flairs. Is given a refill today to use as needed      Relevant Medications   ketoconazole (NIZORAL) 2 % cream   Gastroparesis    Patient has controlled with lifestyle adjusments for the most part.       Elevated sed rate   Relevant Orders   Sedimentation rate (Completed)   Diabetes mellitus (HCC)    hgba1c acceptable, minimize simple carbs. Increase exercise as tolerated. Continue current meds      Relevant Orders   Hemoglobin A1c (Completed)    F/U in 6 months.  Meds ordered this encounter  Medications   ketoconazole (NIZORAL) 2 %  cream    Sig: APPLY EXTERNALLY TO THE AFFECTED AREA DAILY    Dispense:  60 g    Refill:  2    I, [name], MD, personally preformed the services described in this documentation.  All medical record entries made by the scribe were at my direction and in my presence.  I have reviewed the chart and discharge instructions (if applicable) and agree that the record reflects my personal performance and is accurate and complete. [name], MD  04/25/2021    Ardell Isaacs as a scribe for [name], MD.,have documented all relevant documentation on the behalf of [name], MD,as directed by  [name], MD while in the presence of [name], MD.  I, Mosie Lukes, MD personally performed the services described in this documentation. All medical record entries made by the scribe were at my direction and in my presence. I have reviewed the chart and agree that the record reflects my personal performance and is accurate and complete    [name], MD

## 2021-04-26 NOTE — Assessment & Plan Note (Signed)
Encourage heart healthy diet such as MIND or DASH diet, increase exercise, avoid trans fats, simple carbohydrates and processed foods, consider a krill or fish or flaxseed oil cap daily.  °

## 2021-04-26 NOTE — Assessment & Plan Note (Signed)
Uses small amounts of Ketoconazole infrequently when dermatitis flairs. Is given a refill today to use as needed

## 2021-04-26 NOTE — Assessment & Plan Note (Signed)
Hydrate and monitor 

## 2021-04-26 NOTE — Assessment & Plan Note (Signed)
Supplement and monitor 

## 2021-04-26 NOTE — Assessment & Plan Note (Signed)
hgba1c acceptable, minimize simple carbs. Increase exercise as tolerated. Continue current meds 

## 2021-04-26 NOTE — Assessment & Plan Note (Signed)
Patient has controlled with lifestyle adjusments for the most part.

## 2021-04-26 NOTE — Assessment & Plan Note (Signed)
Well controlled, no changes to meds. Encouraged heart healthy diet such as the DASH diet and exercise as tolerated.  °

## 2021-04-28 LAB — VITAMIN D 1,25 DIHYDROXY
Vitamin D 1, 25 (OH)2 Total: 62 pg/mL (ref 18–72)
Vitamin D2 1, 25 (OH)2: <8 pg/mL
Vitamin D3 1, 25 (OH)2: 62 pg/mL

## 2021-05-06 ENCOUNTER — Other Ambulatory Visit: Payer: Self-pay | Admitting: Family Medicine

## 2021-06-10 ENCOUNTER — Other Ambulatory Visit: Payer: Self-pay | Admitting: Family Medicine

## 2021-06-14 ENCOUNTER — Telehealth: Payer: Self-pay | Admitting: *Deleted

## 2021-06-14 ENCOUNTER — Other Ambulatory Visit: Payer: Self-pay | Admitting: Family Medicine

## 2021-06-14 NOTE — Telephone Encounter (Signed)
Prior auth started via cover my meds.  Awaiting determination.  Key: [id number]

## 2021-06-14 NOTE — Telephone Encounter (Signed)
We received message from covermymeds:  "Your PA has been resolved, no additional PA is required. For further inquiries please contact the number on the back of the member prescription card. (Message [phone])"  Called [facility] [phone] and spoke with Hoyle Sauer.  Was on hold for a while and she stated that the pharmacy needed to call the pharmacy help desk.   Called [facility] (was on hold for about 27min) and I advised them of what they will need to.  They said they will call the help desk.  I also advised that if they cannot get to go thru to please call us back as soon as possible.    Called patient and advised patient of the above and for her to let us know as well if she does not get her medication.

## 2021-07-30 ENCOUNTER — Other Ambulatory Visit: Payer: Self-pay | Admitting: Family Medicine

## 2021-08-09 LAB — HM DIABETES EYE EXAM

## 2021-09-13 ENCOUNTER — Other Ambulatory Visit: Payer: Self-pay | Admitting: Family Medicine

## 2021-09-26 ENCOUNTER — Other Ambulatory Visit: Payer: Self-pay | Admitting: Family Medicine

## 2021-10-28 ENCOUNTER — Other Ambulatory Visit: Payer: Self-pay | Admitting: Family Medicine

## 2021-11-07 ENCOUNTER — Encounter: Payer: BC Managed Care – PPO | Admitting: Family Medicine

## 2021-11-18 ENCOUNTER — Other Ambulatory Visit: Payer: Self-pay | Admitting: Family Medicine

## 2021-11-19 ENCOUNTER — Other Ambulatory Visit: Payer: Self-pay | Admitting: Family Medicine

## 2021-12-06 ENCOUNTER — Ambulatory Visit (INDEPENDENT_AMBULATORY_CARE_PROVIDER_SITE_OTHER): Payer: BC Managed Care – PPO | Admitting: Family Medicine

## 2021-12-06 ENCOUNTER — Encounter: Payer: Self-pay | Admitting: Family Medicine

## 2021-12-06 VITALS — BP 120/67 | HR 92 | Ht 65.0 in | Wt 168.6 lb

## 2021-12-06 DIAGNOSIS — E559 Vitamin D deficiency, unspecified: Secondary | ICD-10-CM

## 2021-12-06 DIAGNOSIS — E1159 Type 2 diabetes mellitus with other circulatory complications: Secondary | ICD-10-CM | POA: Diagnosis not present

## 2021-12-06 DIAGNOSIS — Z0000 Encounter for general adult medical examination without abnormal findings: Secondary | ICD-10-CM | POA: Diagnosis not present

## 2021-12-06 DIAGNOSIS — E782 Mixed hyperlipidemia: Secondary | ICD-10-CM | POA: Diagnosis not present

## 2021-12-06 LAB — COMPREHENSIVE METABOLIC PANEL
ALT: 12 U/L (ref 0–35)
AST: 12 U/L (ref 0–37)
Albumin: 4.4 g/dL (ref 3.5–5.2)
Alkaline Phosphatase: 107 U/L (ref 39–117)
BUN: 14 mg/dL (ref 6–23)
CO2: 29 mEq/L (ref 19–32)
Calcium: 9.7 mg/dL (ref 8.4–10.5)
Chloride: 103 mEq/L (ref 96–112)
Creatinine, Ser: 0.74 mg/dL (ref 0.40–1.20)
GFR: 85.24 mL/min (ref >60.00)
Glucose, Bld: 96 mg/dL (ref 70–99)
Potassium: 4.7 mEq/L (ref 3.5–5.1)
Sodium: 141 mEq/L (ref 135–145)
Total Bilirubin: 0.4 mg/dL (ref 0.2–1.2)
Total Protein: 7 g/dL (ref 6.0–8.3)

## 2021-12-06 LAB — LIPID PANEL
Cholesterol: 159 mg/dL (ref 0–200)
HDL: 41 mg/dL (ref >39.00)
LDL Cholesterol: 97 mg/dL (ref 0–99)
NonHDL: 118.29
Total CHOL/HDL Ratio: 4
Triglycerides: 107 mg/dL (ref 0.0–149.0)
VLDL: 21.4 mg/dL (ref 0.0–40.0)

## 2021-12-06 LAB — CBC
HCT: 39.5 % (ref 36.0–46.0)
Hemoglobin: 12.2 g/dL (ref 12.0–15.0)
MCHC: 31 g/dL (ref 30.0–36.0)
MCV: 78.6 fl (ref 78.0–100.0)
Platelets: 456 10*3/uL — ABNORMAL HIGH (ref 150.0–400.0)
RBC: 5.03 Mil/uL (ref 3.87–5.11)
RDW: 18.2 % — ABNORMAL HIGH (ref 11.5–15.5)
WBC: 7.2 10*3/uL (ref 4.0–10.5)

## 2021-12-06 LAB — TSH: TSH: 0.92 u[IU]/mL (ref 0.35–5.50)

## 2021-12-06 LAB — HEMOGLOBIN A1C: Hgb A1c MFr Bld: 6.9 % — ABNORMAL HIGH (ref 4.6–6.5)

## 2021-12-06 NOTE — Progress Notes (Addendum)
? ?Complete physical exam ? ?Patient: Lori Zamora   DOB: 09/08/1955   66 y.o. Female  MRN: [id number] ? ?Subjective:  ?  ?CC: CPE, no concerns ? ? ?NAI BORROMEO is a 66 y.o. female who presents today for a complete physical exam. She reports consuming a general diet.  No regular exercise.  She generally feels well. She reports sleeping well. She does not have additional problems to discuss today.  ? ? ? ?Most recent fall risk assessment: ? ?  12/06/2021  ?  8:20 AM  ?Fall Risk   ?Falls in the past year? 0  ?Number falls in past yr: 0  ?Injury with Fall? 0  ?Risk for fall due to : No Fall Risks  ?Follow up Falls evaluation completed  ? ?  ?Most recent depression screenings: ? ?  12/06/2021  ?  8:19 AM 04/21/2020  ? 11:42 AM  ?PHQ 2/9 Scores  ?PHQ - 2 Score 0 0  ?PHQ- 9 Score  0  ? ? ?Vision:Within last year, Dental: No current dental problems and Receives regular dental care, and STD: no concerns, not sexually active  ? ? ? ?Patient Care Team: ?Mosie Lukes, MD as PCP - General (Family Medicine) ?Delrae Rend, MD as Consulting Physician (Endocrinology) ?Luberta Mutter, MD as Consulting Physician (Ophthalmology) ?Charlean Sanfilippo, MD as Referring Physician (Gastroenterology) ?Newell Coral., MD as Referring Physician (Gastroenterology)  ? ?Outpatient Medications Prior to Visit  ?Medication Sig  ? amLODipine (NORVASC) 5 MG tablet TAKE 1 TABLET(5 MG) BY MOUTH TWICE DAILY  ? Ascorbic Acid (VITAMIN C) 1000 MG tablet Take 1 tablet (1,000 mg total) by mouth daily.  ? aspirin EC 81 MG tablet Take 1 tablet (81 mg total) by mouth daily.  ? atorvastatin (LIPITOR) 20 MG tablet TAKE 3 TABLETS(60 MG) BY MOUTH DAILY  ? cholecalciferol (VITAMIN D) 1000 UNITS tablet Take 2,000 Units by mouth daily.  ? diphenhydrAMINE HCl, Sleep, 50 MG tablet Take by mouth.  ? empagliflozin (JARDIANCE) 10 MG TABS tablet Take 10 mg by mouth daily.  ? folic acid (FOLVITE) 1 MG tablet TAKE 1 TABLET(1 MG) BY MOUTH DAILY  ? Lidocaine,  Anorectal, 5 % CREA Apply topically as needed.  ? losartan (COZAAR) 25 MG tablet TAKE 1 TABLET(25 MG) BY MOUTH DAILY  ? Magnesium Oxide 500 MG TABS Take by mouth.  ? metFORMIN (GLUCOPHAGE) 500 MG tablet Take 1,000 mg by mouth 2 (two) times daily with a meal.  ? Mouthwashes (BIOTENE/CALCIUM PBF) LIQD by Transmucosal route.  ? NON FORMULARY Lubricant eye ointment- sterile mineral oil 39.9% White Petroleum 57.7%  ? NON FORMULARY Biotene dry mouth oral rinse  ? omeprazole (PRILOSEC) 20 MG capsule TAKE 1 CAPSULE(20 MG) BY MOUTH DAILY  ? ONETOUCH VERIO test strip CHECK BLOOD SUGAR IN VITRO TWICE DAILY  ? Polyethyl Glycol-Propyl Glycol (SYSTANE) 0.4-0.3 % GEL ophthalmic gel Apply to eye.  ? PRESCRIPTION MEDICATION prevident 5000- dry mouth toothpaste  ? Probiotic Product (DIGESTIVE ADVANTAGE GUMMIES PO) Take by mouth 2 (two) times daily.  ? RA SUNSCREEN SPF50 EX Apply topically as needed.  ? zinc gluconate 50 MG tablet Take 50 mg by mouth daily.  ? [DISCONTINUED] alclomethasone (ACLOVATE) 0.05 % cream Apply 1 application topically 2 (two) times daily.  ? [DISCONTINUED] Blood Glucose Calibration (OT ULTRA/FASTTK CNTRL SOLN) SOLN Check Sugars prn  ? [DISCONTINUED] ketoconazole (NIZORAL) 2 % cream APPLY EXTERNALLY TO THE AFFECTED AREA DAILY  ? [DISCONTINUED] Magnesium Oxide 500 MG (LAX) TABS Take by  mouth.  ? [DISCONTINUED] sodium fluoride (FLUORISHIELD) 1.1 % GEL dental gel   ? ?No facility-administered medications prior to visit.  ? ? ?ROS ?All review of systems negative except what is listed in the HPI ? ? ? ? ?   ?Objective:  ? ?  ?BP 120/67   Pulse 92   Ht '5\' 5"'$  (1.651 m)   Wt 168 lb 9.6 oz (76.5 kg)   BMI 28.06 kg/m?  ? ? ?Physical Exam ?Vitals reviewed.  ?Constitutional:   ?   General: She is not in acute distress. ?   Appearance: Normal appearance. She is not ill-appearing.  ?HENT:  ?   Head: Normocephalic and atraumatic.  ?   Right Ear: There is impacted cerumen.  ?   Left Ear: Tympanic membrane normal.  ?    Nose: Nose normal.  ?   Mouth/Throat:  ?   Mouth: Mucous membranes are moist.  ?   Pharynx: Oropharynx is clear.  ?Eyes:  ?   Extraocular Movements: Extraocular movements intact.  ?   Conjunctiva/sclera: Conjunctivae normal.  ?   Pupils: Pupils are equal, round, and reactive to light.  ?Cardiovascular:  ?   Rate and Rhythm: Normal rate and regular rhythm.  ?   Pulses: Normal pulses.  ?   Heart sounds: Normal heart sounds.  ?Pulmonary:  ?   Effort: Pulmonary effort is normal.  ?   Breath sounds: Normal breath sounds.  ?Abdominal:  ?   General: Abdomen is flat. Bowel sounds are normal. There is no distension.  ?   Palpations: Abdomen is soft. There is no mass.  ?   Tenderness: There is no abdominal tenderness. There is no guarding or rebound.  ?Musculoskeletal:     ?   General: Normal range of motion.  ?   Cervical back: Normal range of motion and neck supple.  ?Skin: ?   General: Skin is warm and dry.  ?   Capillary Refill: Capillary refill takes less than 2 seconds.  ?   Comments: Diabetic foot exam was performed.  ?No deformities or other abnormal visual findings.  ?Posterior tibialis and dorsalis pulse intact bilaterally.  ?Intact to touch and monofilament testing bilaterally.  ?  ?Neurological:  ?   General: No focal deficit present.  ?   Mental Status: She is alert and oriented to person, place, and time. Mental status is at baseline.  ?   Cranial Nerves: No cranial nerve deficit.  ?   Sensory: No sensory deficit.  ?   Motor: No weakness.  ?   Gait: Gait normal.  ?Psychiatric:     ?   Mood and Affect: Mood normal.     ?   Behavior: Behavior normal.     ?   Thought Content: Thought content normal.     ?   Judgment: Judgment normal.  ?  ? ? ? ?No results found for any visits on 12/06/21. ? ?   ?Assessment & Plan:  ?  ?Routine Health Maintenance and Physical Exam ? ?Immunization History  ?Administered Date(s) Administered  ? Hep A / Hep B 05/11/2013, 06/10/2013, 11/09/2013  ? Influenza Split 05/25/2011, 05/29/2012   ? Influenza Whole 05/28/2008  ? Influenza,inj,Quad PF,6+ Mos 05/18/2013, 06/04/2014, 06/07/2015, 06/07/2016  ? Pneumococcal Conjugate-13 07/27/2013  ? Pneumococcal Polysaccharide-23 12/16/2003  ? Td 12/16/2003  ? Tdap 11/26/2013  ? ? ?Health Maintenance  ?Topic Date Due  ? COVID-19 Vaccine (1) Never done  ? FOOT EXAM  11/07/2017  ?  PAP SMEAR-Modifier  03/08/2019  ? HEMOGLOBIN A1C  10/23/2021  ? OPHTHALMOLOGY EXAM  08/09/2022  ? MAMMOGRAM  11/25/2022  ? TETANUS/TDAP  11/27/2023  ? COLONOSCOPY (Pts 45-31yr Insurance coverage will need to be confirmed)  03/06/2028  ? Hepatitis C Screening  Completed  ? HIV Screening  Completed  ? HPV VACCINES  Aged Out  ? INFLUENZA VACCINE  Discontinued  ? Zoster Vaccines- Shingrix  Discontinued  ?PAP smear last year with OBGYN, Dr. HMatthew Saras Normal.  ? ?Discussed health benefits of physical activity, and encouraged her to engage in regular exercise appropriate for her age and condition. ? ?Problem List Items Addressed This Visit   ? ?  ? Endocrine  ? [place] ([place]  ? Relevant Orders  ? Comprehensive metabolic panel  ? Lipid panel  ? Vitamin D 1,25 dihydroxy  ?  ? Other  ? Vitamin D deficiency  ? Relevant Orders  ? Vitamin D 1,25 dihydroxy  ? Hyperlipidemia, mixed  ? Relevant Orders  ? Comprehensive metabolic panel  ? Lipid panel  ? ?Other Visit Diagnoses   ? ? Annual physical exam    -  Primary  ? Relevant Orders  ? CBC  ? Comprehensive metabolic panel  ? Lipid panel  ? TSH  ? Vitamin D 1,25 dihydroxy  ? Hemoglobin A1c  ? ?  ? ?No new concerns. Continue all medications as prescribed. Continue following with specialists, heart healthy/carb-controlled diet, activity as tolerated.  ? ? ? ?Return in about 6 months (around 06/07/2022) for routine PCP f/u . ? ?  ? ?TTerrilyn Saver NP ? ? ?

## 2021-12-07 ENCOUNTER — Other Ambulatory Visit (HOSPITAL_BASED_OUTPATIENT_CLINIC_OR_DEPARTMENT_OTHER): Payer: Self-pay | Admitting: Family Medicine

## 2021-12-07 DIAGNOSIS — Z1231 Encounter for screening mammogram for malignant neoplasm of breast: Secondary | ICD-10-CM

## 2021-12-08 ENCOUNTER — Encounter: Payer: Self-pay | Admitting: Family Medicine

## 2021-12-08 ENCOUNTER — Ambulatory Visit: Payer: BC Managed Care – PPO | Admitting: Family Medicine

## 2021-12-08 VITALS — BP 119/61 | HR 91 | Ht 65.0 in | Wt 169.8 lb

## 2021-12-08 DIAGNOSIS — H6121 Impacted cerumen, right ear: Secondary | ICD-10-CM | POA: Diagnosis not present

## 2021-12-08 NOTE — Progress Notes (Signed)
? ?  Acute Office Visit ? ?Subjective:  ? ?  ?Patient ID: Lori Zamora, Lori Zamora    Lori Zamora, Lori Zamora, 66 y.o.   MRN: [id number] ? ?CC: cerumen impaction  ? ? ?HPI ?Patient is in today for cerumen impaction.  ? ?Patient states she wanted to get her right ear cleaned out. She forgot to stay for that at last appointment. No pain, but mildly muffled hearing.  ? ? ? ? ?ROS ?All review of systems negative except what is listed in the HPI ? ? ?   ?Objective:  ?  ?BP 119/61   Pulse 91   Ht '5\' 5"'$  (1.651 m)   Wt 169 lb 12.8 oz (77 kg)   SpO2 100%   BMI 28.26 kg/m?  ? ? ?Physical Exam ?Vitals reviewed.  ?Constitutional:   ?   Appearance: Normal appearance.  ?HENT:  ?   Head: Normocephalic and atraumatic.  ?   Right Ear: There is impacted cerumen.  ?Neurological:  ?   General: No focal deficit present.  ?   Mental Status: She is alert and oriented to person, place, and time. Mental status is at baseline.  ?Psychiatric:     ?   Mood and Affect: Mood normal.     ?   Behavior: Behavior normal.     ?   Thought Content: Thought content normal.     ?   Judgment: Judgment normal.  ? ? ? ? ? ?No results found for any visits on 12/08/21. ? ? ?   ?Assessment & Plan:  ? ?1. Impacted cerumen of right ear ?Indication: Cerumen impaction of the ear(s) by CMA ?Medical necessity statement: On physical examination, cerumen impairs clinically significant portions of the external auditory canal, and tympanic membrane. Noted obstructive, copious cerumen that cannot be removed without magnification and instrumentations requiring skills ?Consent: Discussed benefits and risks of procedure and verbal consent obtained ?Procedure: Patient was prepped for the procedure. Utilized an otoscope to assess and take note of the ear canal, the tympanic membrane, and the presence, amount, and placement of the cerumen. Gentle water irrigation and soft plastic curette was utilized to remove cerumen.  ?Post procedure examination: shows cerumen was mostly removed.  Patient tolerated procedure well. The patient is made aware that they may experience temporary vertigo, temporary hearing loss, and temporary discomfort. If these symptom last for more than 24 hours to call the clinic or proceed to the ED. ? ?Can try debrox drops and let warm shower water rinse through to soften remaining cerumen.  ? ? ?Return if symptoms worsen or fail to improve. ? ?Terrilyn Saver, NP ? ? ?

## 2021-12-10 LAB — VITAMIN D 1,25 DIHYDROXY
Vitamin D 1, 25 (OH)2 Total: 61 pg/mL (ref 18–72)
Vitamin D2 1, 25 (OH)2: <8 pg/mL
Vitamin D3 1, 25 (OH)2: 61 pg/mL

## 2021-12-12 ENCOUNTER — Encounter: Payer: Self-pay | Admitting: *Deleted

## 2021-12-13 ENCOUNTER — Ambulatory Visit (HOSPITAL_BASED_OUTPATIENT_CLINIC_OR_DEPARTMENT_OTHER)
Admission: RE | Admit: 2021-12-13 | Discharge: 2021-12-13 | Disposition: A | Discharge status: Home / Self Care | Payer: BC Managed Care – PPO | Source: Ambulatory Visit | Attending: Family Medicine | Admitting: Family Medicine

## 2021-12-13 ENCOUNTER — Encounter (HOSPITAL_BASED_OUTPATIENT_CLINIC_OR_DEPARTMENT_OTHER): Payer: Self-pay

## 2021-12-13 DIAGNOSIS — Z1231 Encounter for screening mammogram for malignant neoplasm of breast: Secondary | ICD-10-CM | POA: Insufficient documentation

## 2022-01-02 ENCOUNTER — Telehealth: Payer: Self-pay | Admitting: Family Medicine

## 2022-01-02 NOTE — Telephone Encounter (Signed)
Case manager from [facility] is requesting last ov notes, and labs. This can be faxed to [phone].

## 2022-01-02 NOTE — Telephone Encounter (Signed)
Notes and labs faxed to number provided.

## 2022-01-24 ENCOUNTER — Other Ambulatory Visit: Payer: Self-pay

## 2022-02-08 ENCOUNTER — Other Ambulatory Visit: Payer: Self-pay

## 2022-02-08 MED ORDER — LOSARTAN POTASSIUM 25 MG PO TABS: ORAL_TABLET | ORAL | 1 refills | Status: DC

## 2022-03-12 DIAGNOSIS — K3184 Gastroparesis: Secondary | ICD-10-CM | POA: Diagnosis not present

## 2022-03-12 DIAGNOSIS — E119 Type 2 diabetes mellitus without complications: Secondary | ICD-10-CM | POA: Diagnosis not present

## 2022-03-12 DIAGNOSIS — E042 Nontoxic multinodular goiter: Secondary | ICD-10-CM | POA: Diagnosis not present

## 2022-06-06 NOTE — Assessment & Plan Note (Signed)
Encourage heart healthy diet such as MIND or DASH diet, increase exercise, avoid trans fats, simple carbohydrates and processed foods, consider a krill or fish or flaxseed oil cap daily. Tolerating Atorvastatin 

## 2022-06-06 NOTE — Assessment & Plan Note (Addendum)
hgba1c acceptable, minimize simple carbs. Increase exercise as tolerated. Continue current meds Consider Prevnar 20. RSV (respiratory syncitial virus) vaccine at pharmacy Covid booster when new version at pharmacy High dose flu shot Tetanus due 2025 or if injury occurs.  Shingrix is the new shingles shot, 2 shots over 2-6 months, confirm coverage with insurance and document, then can return here for shots with nurse appt or at pharmacy

## 2022-06-06 NOTE — Assessment & Plan Note (Signed)
Well controlled, no changes to meds. Encouraged heart healthy diet such as the DASH diet and exercise as tolerated.  °

## 2022-06-06 NOTE — Assessment & Plan Note (Signed)
Supplement and monitor 

## 2022-06-07 ENCOUNTER — Ambulatory Visit (INDEPENDENT_AMBULATORY_CARE_PROVIDER_SITE_OTHER): Payer: HMO | Admitting: Family Medicine

## 2022-06-07 VITALS — BP 120/62 | HR 84 | Temp 98.3°F | Resp 16 | Ht 65.0 in | Wt 170.0 lb

## 2022-06-07 DIAGNOSIS — I10 Essential (primary) hypertension: Secondary | ICD-10-CM | POA: Diagnosis not present

## 2022-06-07 DIAGNOSIS — R350 Frequency of micturition: Secondary | ICD-10-CM

## 2022-06-07 DIAGNOSIS — E782 Mixed hyperlipidemia: Secondary | ICD-10-CM

## 2022-06-07 DIAGNOSIS — E559 Vitamin D deficiency, unspecified: Secondary | ICD-10-CM

## 2022-06-07 DIAGNOSIS — E1159 Type 2 diabetes mellitus with other circulatory complications: Secondary | ICD-10-CM | POA: Diagnosis not present

## 2022-06-07 DIAGNOSIS — N289 Disorder of kidney and ureter, unspecified: Secondary | ICD-10-CM | POA: Diagnosis not present

## 2022-06-07 DIAGNOSIS — R700 Elevated erythrocyte sedimentation rate: Secondary | ICD-10-CM | POA: Diagnosis not present

## 2022-06-07 NOTE — Assessment & Plan Note (Signed)
Hydrate and monitor 

## 2022-06-07 NOTE — Patient Instructions (Signed)
Dyslipidemia Dyslipidemia is an imbalance of waxy, fat-like substances (lipids) in the blood. The body needs lipids in small amounts. Dyslipidemia often involves a high level of cholesterol or triglycerides, which are types of lipids. Common forms of dyslipidemia include: High levels of LDL cholesterol. LDL is the type of cholesterol that causes fatty deposits (plaques) to build up in the blood vessels that carry blood away from the heart (arteries). Low levels of HDL cholesterol. HDL cholesterol is the type of cholesterol that protects against heart disease. High levels of HDL remove the LDL buildup from arteries. High levels of triglycerides. Triglycerides are a fatty substance in the blood that is linked to a buildup of plaques in the arteries. What are the causes? There are two main types of dyslipidemia: primary and secondary. Primary dyslipidemia is caused by changes (mutations) in genes that are passed down through families (inherited). These mutations cause several types of dyslipidemia. Secondary dyslipidemia may be caused by various risk factors that can lead to the disease, such as lifestyle choices and certain medical conditions. What increases the risk? You are more likely to develop this condition if you are an older man or if you are a woman who has gone through menopause. Other risk factors include: Having a family history of dyslipidemia. Taking certain medicines, including birth control pills, steroids, some diuretics, and beta-blockers. Eating a diet high in saturated fat. Smoking cigarettes or excessive alcohol intake. Having certain medical conditions such as diabetes, polycystic ovary syndrome (PCOS), kidney disease, liver disease, or hypothyroidism. Not exercising regularly. Being overweight or obese with too much belly fat. What are the signs or symptoms? In most cases, dyslipidemia does not usually cause any symptoms. In severe cases, very high lipid levels can  cause: Fatty bumps under the skin (xanthomas). A white or gray ring around the black center (pupil) of the eye. Very high triglyceride levels can cause inflammation of the pancreas (pancreatitis). How is this diagnosed? Your health care provider may diagnose dyslipidemia based on a routine blood test (fasting blood test). Because most people do not have symptoms of the condition, this blood testing (lipid profile) is done on adults age 20 and older and is repeated every 4-6 years. This test checks: Total cholesterol. This measures the total amount of cholesterol in your blood, including LDL cholesterol, HDL cholesterol, and triglycerides. A healthy number is below 200 mg/dL (5.17 mmol/L). LDL cholesterol. The target number for LDL cholesterol is different for each person, depending on individual risk factors. A healthy number is usually below 100 mg/dL (2.59 mmol/L). Ask your health care provider what your LDL cholesterol should be. HDL cholesterol. An HDL level of 60 mg/dL (1.55 mmol/L) or higher is best because it helps to protect against heart disease. A number below 40 mg/dL (1.03 mmol/L) for men or below 50 mg/dL (1.29 mmol/L) for women increases the risk for heart disease. Triglycerides. A healthy triglyceride number is below 150 mg/dL (1.69 mmol/L). If your lipid profile is abnormal, your health care provider may do other blood tests. How is this treated? Treatment depends on the type of dyslipidemia that you have and your other risk factors for heart disease and stroke. Your health care provider will have a target range for your lipid levels based on this information. Treatment for dyslipidemia starts with lifestyle changes, such as diet and exercise. Your health care provider may recommend that you: Get regular exercise. Make changes to your diet. Quit smoking if you smoke. Limit your alcohol intake. If diet   changes and exercise do not help you reach your goals, your health care provider  may also prescribe medicine to lower lipids. The most commonly prescribed type of medicine lowers your LDL cholesterol (statin drug). If you have a high triglyceride level, your provider may prescribe another type of drug (fibrate) or an omega-3 fish oil supplement, or both. Follow these instructions at home: Eating and drinking  Follow instructions from your health care provider or dietitian about eating or drinking restrictions. Eat a healthy diet as told by your health care provider. This can help you reach and maintain a healthy weight, lower your LDL cholesterol, and raise your HDL cholesterol. This may include: Limiting your calories, if you are overweight. Eating more fruits, vegetables, whole grains, fish, and lean meats. Limiting saturated fat, trans fat, and cholesterol. Do not drink alcohol if: Your health care provider tells you not to drink. You are pregnant, may be pregnant, or are planning to become pregnant. If you drink alcohol: Limit how much you have to: 0-1 drink a day for women. 0-2 drinks a day for men. Know how much alcohol is in your drink. In the [place] one drink equals one 12 oz bottle of beer (355 mL), one 5 oz glass of wine (148 mL), or one 1 oz glass of hard liquor (44 mL). Activity Get regular exercise. Start an exercise and strength training program as told by your health care provider. Ask your health care provider what activities are safe for you. Your health care provider may recommend: 30 minutes of aerobic activity 4-6 days a week. Brisk walking is an example of aerobic activity. Strength training 2 days a week. General instructions Do not use any products that contain nicotine or tobacco. These products include cigarettes, chewing tobacco, and vaping devices, such as e-cigarettes. If you need help quitting, ask your health care provider. Take over-the-counter and prescription medicines only as told by your health care provider. This includes  supplements. Keep all follow-up visits. This is important. Contact a health care provider if: You are having trouble sticking to your exercise or diet plan. You are struggling to quit smoking or to control your use of alcohol. Summary Dyslipidemia often involves a high level of cholesterol or triglycerides, which are types of lipids. Treatment depends on the type of dyslipidemia that you have and your other risk factors for heart disease and stroke. Treatment for dyslipidemia starts with lifestyle changes, such as diet and exercise. Your health care provider may prescribe medicine to lower lipids. This information is not intended to replace advice given to you by your health care provider. Make sure you discuss any questions you have with your health care provider. Document Revised: 03/02/2022 Document Reviewed: 10/03/2020 Elsevier Patient Education  2023 Elsevier Inc.  

## 2022-06-07 NOTE — Progress Notes (Signed)
Subjective:   By signing my name below, I, Lori Zamora, attest that this documentation has been prepared under the direction and in the presence of Mosie Lukes, MD., 06/07/2022.     Patient ID: Lori Zamora, female    DOB: [birth year], 66 y.o.   MRN: [id number]  Chief Complaint  Patient presents with   Follow-up    Follow up   HPI Patient is in today for an office visit.  Endocrinology: She is currently seeing her endocrinologist, Dr. Delrae Rend, to manage her type 2 Diabetes Mellitus. She reports that her last HbA1C test result on 03/12/2022 was 6.8%. She is requesting a urine test for proteinuria.  Immunizations: She has been informed about receiving COVID-19, high-dose Flu, and RSV immunizations. She reports that she received a Pneumonia immunization  several years ago that caused her to have a fever.  Podiatry: She states that she regularly sees her podiatrist.   Sinus: She reports that she experienced sinus discomfort this past summer but was able to manage the symptoms at home.    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   Bipolar 2 disorder ([place]) 01/31/2010   Qualifier: Diagnosis of  By: Fuller Plan CMA (AAMA), [place]  Follows with Dr Letta Moynahan of psychiatry    Bipolar disorder, unspecified ([place])    CFS (chronic fatigue syndrome)    Degeneration of intervertebral disc, site unspecified    Depressive disorder, not elsewhere classified    Esophageal reflux    FH: breast cancer 05/20/2017   Gastroparesis 06/07/2015   Hyperglycemia 01/20/2018   Intertriginous candidiasis 11/29/2013   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   Microcytic anemia 01/31/2010   Qualifier: Diagnosis of  By: Fuller Plan CMA (AAMA), [place]     Migraine with aura, without mention of intractable migraine without mention of status migrainosus    Myalgia  and myositis, unspecified    Nontoxic multinodular goiter    Other and unspecified hyperlipidemia    Other malaise and fatigue    Other specified iron deficiency anemias    Other thalassemia ([place])    Parotid gland enlargement 2011   related to connective tissue syndrome   Pelvic floor dysfunction 01/27/2013   Peripheral edema 02/19/2017   Preventative health care 09/12/2014   Renal insufficiency 01/27/2013   Sjogren's syndrome ([place])    Small intestinal bacterial overgrowth 11/29/2013   Systemic lupus erythematosus ([place])    Thrombocytosis 06/07/2016   Type II or unspecified type diabetes mellitus without mention of complication, not stated as uncontrolled    Unspecified constipation    Unspecified diffuse connective tissue disease 01/27/2013   Follows at [facility]ology, Dr Alanda Amass Per patient has tested 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 antibody   Unspecified essential hypertension    Unspecified sleep apnea    Unspecified vitamin D deficiency    Past Surgical History:  Procedure Laterality Date   BIOPSY THYROID     01/13/2015, 3 previous biopsy reported all benign   LAPAROSCOPIC TOTAL HYSTERECTOMY  2004   Family History  Problem Relation Age of Onset   Diabetes Mother    Heart disease Mother        family history   Allergies Mother    Hypertension Mother    Alcohol abuse Other        Family history of alcoholism and  addiction   Hypertension Father    Other Father        viral meningitis   Alcohol abuse Brother        drug abuse   Hypertension Brother    Stroke Sister        4 in 2019   Hypertension Sister    Brain cancer Sister        glioblastoma   Cancer Sister        renal cancer   Cancer Sister 4       breast   Breast cancer Sister 47   Heart disease Maternal Grandmother    Heart disease Maternal Grandfather    Hypertension Other        family history   Kidney disease Other        family history   Stroke Other         1st degree relative <60   Social History   Socioeconomic History   Marital status: Single    Spouse name: Not on file   Number of children: 0   Years of education: Not on file   Highest education level: Not on file  Occupational History   Occupation: Pharmacist, hospital  Tobacco Use   Smoking status: Never   Smokeless tobacco: Never  Vaping Use   Vaping Use: Not on file  Substance and Sexual Activity   Alcohol use: No   Drug use: No   Sexual activity: Never    Birth control/protection: None    Comment: lives alone, no major dietary restrictions, retired from teaching kindergarten  Other Topics Concern   Not on file  Social History Narrative   Former Pharmacist, hospital, on disability   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     aspirin EC 81 MG tablet Take 1 tablet (81 mg total) by mouth daily.     atorvastatin (LIPITOR) 20 MG tablet TAKE 3 TABLETS(60 MG) BY MOUTH DAILY 270 tablet 1   cholecalciferol (VITAMIN D) 1000 UNITS tablet Take 2,000 Units by mouth daily.     diphenhydrAMINE HCl, Sleep, 50 MG tablet Take by mouth.     empagliflozin (JARDIANCE) 10 MG TABS tablet Take 10 mg by mouth daily.     folic acid (FOLVITE) 1 MG tablet TAKE 1 TABLET(1 MG) BY MOUTH DAILY 90 tablet 3   Lidocaine, Anorectal, 5 % CREA Apply topically as needed.     losartan (COZAAR) 25 MG tablet TAKE 1 TABLET(25 MG) BY MOUTH DAILY 90 tablet 1   Magnesium Oxide 500 MG TABS Take by mouth.     metFORMIN (GLUCOPHAGE)  500 MG tablet Take 1,000 mg by mouth 2 (two) times daily with a meal.     Mouthwashes (BIOTENE/CALCIUM PBF) LIQD by Transmucosal route.     NON FORMULARY Lubricant eye ointment- sterile mineral oil 39.9% White Petroleum 57.7%     NON FORMULARY Biotene dry mouth oral rinse     omeprazole (PRILOSEC)  20 MG capsule TAKE 1 CAPSULE(20 MG) BY MOUTH DAILY 90 capsule 3   ONETOUCH VERIO test strip CHECK BLOOD SUGAR IN VITRO TWICE DAILY 200 strip 2   Polyethyl Glycol-Propyl Glycol (SYSTANE) 0.4-0.3 % GEL ophthalmic gel Apply to eye.     PRESCRIPTION MEDICATION prevident 5000- dry mouth toothpaste     Probiotic Product (DIGESTIVE ADVANTAGE GUMMIES PO) Take by mouth 2 (two) times daily.     RA SUNSCREEN SPF50 EX Apply topically as needed.     zinc gluconate 50 MG tablet Take 50 mg by mouth daily.     No facility-administered medications prior to visit.   Allergies  Allergen Reactions   Penicillins Swelling and Anaphylaxis    Swelling of tongue.   Sulfa Antibiotics Other (See Comments) and Rash    Other Reaction: swellin of tongue Other Reaction: swellin of tongue   Sulfonamide Derivatives Swelling    REACTION: Swelling of tongue and face   Acetazolamide    Azathioprine Other (See Comments)    Very low TPMT in deficient range Unknown   Ezetimibe Other (See Comments)    Unknown   Lisinopril Other (See Comments)    Unknown   Meloxicam Other (See Comments)    Unknown   Pravastatin Sodium Other (See Comments)    Unknown   Simvastatin Other (See Comments)    Unknown   Latex Hives and Rash   ROS    Objective:    Physical Exam Constitutional:      General: She is not in acute distress.    Appearance: Normal appearance. She is not ill-appearing.  HENT:     Head: Normocephalic and atraumatic.     Right Ear: External ear normal.     Left Ear: External ear normal.     Mouth/Throat:     Mouth: Mucous membranes are moist.     Pharynx: Oropharynx is clear.  Eyes:     Extraocular Movements:  Extraocular movements intact.     Pupils: Pupils are equal, round, and reactive to light.  Cardiovascular:     Rate and Rhythm: Normal rate and regular rhythm.     Pulses: Normal pulses.     Heart sounds: Normal heart sounds. No murmur heard.    No gallop.  Pulmonary:     Effort: Pulmonary effort is normal. No respiratory distress.     Breath sounds: Normal breath sounds. No wheezing or rales.  Abdominal:     General: Bowel sounds are normal.  Skin:    General: Skin is warm and dry.  Neurological:     Mental Status: She is alert and oriented to person, place, and time.  Psychiatric:        Mood and Affect: Mood normal.        Behavior: Behavior normal.        Judgment: Judgment normal.    BP 120/62 (BP Location: Right Arm, Patient Position: Sitting, Cuff Size: Normal)   Pulse 84   Temp 98.3 F (36.8 C) (Oral)   Resp 16   Ht '5\' 5"'$  (1.651 m)   Wt 170 lb (77.1 kg)   SpO2 98%   BMI 28.29 kg/m  Wt Readings from Last 3 Encounters:  06/07/22 170 lb (77.1 kg)  12/08/21 169 lb 12.8 oz (77 kg)  12/06/21 168 lb 9.6 oz (76.5 kg)   Diabetic Foot Exam - Simple   No data filed    Lab Results  Component Value Date   WBC 7.2 12/06/2021   HGB  12.2 12/06/2021   HCT 39.5 12/06/2021   PLT 456.0 (H) 12/06/2021   GLUCOSE 96 12/06/2021   CHOL 159 12/06/2021   TRIG 107.0 12/06/2021   HDL 41.00 12/06/2021   LDLDIRECT 95 12/22/2009   LDLCALC 97 12/06/2021   ALT 12 12/06/2021   AST 12 12/06/2021   NA 141 12/06/2021   K 4.7 12/06/2021   CL 103 12/06/2021   CREATININE 0.74 12/06/2021   BUN 14 12/06/2021   CO2 29 12/06/2021   TSH 0.92 12/06/2021   HGBA1C 6.9 (H) 12/06/2021   MICROALBUR 1.6 10/16/2019   Lab Results  Component Value Date   TSH 0.92 12/06/2021   Lab Results  Component Value Date   WBC 7.2 12/06/2021   HGB 12.2 12/06/2021   HCT 39.5 12/06/2021   MCV 78.6 12/06/2021   PLT 456.0 (H) 12/06/2021   Lab Results  Component Value Date   NA 141 12/06/2021   K 4.7  12/06/2021   CO2 29 12/06/2021   GLUCOSE 96 12/06/2021   BUN 14 12/06/2021   CREATININE 0.74 12/06/2021   BILITOT 0.4 12/06/2021   ALKPHOS 107 12/06/2021   AST 12 12/06/2021   ALT 12 12/06/2021   PROT 7.0 12/06/2021   ALBUMIN 4.4 12/06/2021   CALCIUM 9.7 12/06/2021   ANIONGAP 10 08/22/2018   GFR 85.24 12/06/2021   Lab Results  Component Value Date   CHOL 159 12/06/2021   Lab Results  Component Value Date   HDL 41.00 12/06/2021   Lab Results  Component Value Date   LDLCALC 97 12/06/2021   Lab Results  Component Value Date   TRIG 107.0 12/06/2021   Lab Results  Component Value Date   CHOLHDL 4 12/06/2021   Lab Results  Component Value Date   HGBA1C 6.9 (H) 12/06/2021      Assessment & Plan:   Problem List Items Addressed This Visit     Vitamin D deficiency    Supplement and monitor      Relevant Orders   VITAMIN D 25 Hydroxy (Vit-D Deficiency, Fractures)   Hyperlipidemia, mixed    Encourage heart healthy diet such as MIND or DASH diet, increase exercise, avoid trans fats, simple carbohydrates and processed foods, consider a krill or fish or flaxseed oil cap daily. Tolerating Atorvastatin      Relevant Orders   Lipid panel   Hemoglobin A1c   Essential hypertension    Well controlled, no changes to meds. Encouraged heart healthy diet such as the DASH diet and exercise as tolerated.       Relevant Orders   CBC   Comprehensive metabolic panel   TSH   Renal insufficiency    Hydrate and monitor      Elevated sed rate - Primary   Relevant Orders   Sedimentation rate   Diabetes mellitus (HCC)    hgba1c acceptable, minimize simple carbs. Increase exercise as tolerated. Continue current meds Consider Prevnar 20. RSV (respiratory syncitial virus) vaccine at pharmacy Covid booster when new version at pharmacy High dose flu shot Tetanus due 2025 or if injury occurs.  Shingrix is the new shingles shot, 2 shots over 2-6 months, confirm coverage with  insurance and document, then can return here for shots with nurse appt or at pharmacy      Relevant Orders   Hemoglobin A1c   Other Visit Diagnoses     Urinary frequency       Relevant Orders   Urinalysis      No orders  of the defined types were placed in this encounter.  I, [name], MD, personally preformed the services described in this documentation.  All medical record entries made by the scribe were at my direction and in my presence.  I have reviewed the chart and discharge instructions (if applicable) and agree that the record reflects my personal performance and is accurate and complete. 06/07/2022  I,Mohammed Iqbal,acting as a scribe for [name], MD.,have documented all relevant documentation on the behalf of [name], MD,as directed by  [name], MD while in the presence of [name], MD.  [name], MD

## 2022-06-09 ENCOUNTER — Encounter: Payer: Self-pay | Admitting: Family Medicine

## 2022-06-11 ENCOUNTER — Ambulatory Visit: Payer: HMO

## 2022-06-11 MED ORDER — AMLODIPINE BESYLATE 5 MG PO TABS: 5.0000 mg | ORAL_TABLET | Freq: Two times a day (BID) | ORAL | 1 refills | Status: DC

## 2022-06-15 ENCOUNTER — Telehealth: Payer: Self-pay | Admitting: *Deleted

## 2022-06-15 ENCOUNTER — Other Ambulatory Visit (INDEPENDENT_AMBULATORY_CARE_PROVIDER_SITE_OTHER): Payer: HMO

## 2022-06-15 DIAGNOSIS — R700 Elevated erythrocyte sedimentation rate: Secondary | ICD-10-CM

## 2022-06-15 DIAGNOSIS — I10 Essential (primary) hypertension: Secondary | ICD-10-CM

## 2022-06-15 DIAGNOSIS — E782 Mixed hyperlipidemia: Secondary | ICD-10-CM

## 2022-06-15 DIAGNOSIS — R350 Frequency of micturition: Secondary | ICD-10-CM

## 2022-06-15 DIAGNOSIS — E559 Vitamin D deficiency, unspecified: Secondary | ICD-10-CM | POA: Diagnosis not present

## 2022-06-15 DIAGNOSIS — E1159 Type 2 diabetes mellitus with other circulatory complications: Secondary | ICD-10-CM | POA: Diagnosis not present

## 2022-06-15 LAB — URINALYSIS
Bilirubin Urine: NEGATIVE
Hgb urine dipstick: NEGATIVE
Ketones, ur: NEGATIVE
Leukocytes,Ua: NEGATIVE
Nitrite: NEGATIVE
Specific Gravity, Urine: 1.015 (ref 1.000–1.030)
Total Protein, Urine: NEGATIVE
Urine Glucose: >=1000 — AB
Urobilinogen, UA: 0.2 (ref 0.0–1.0)
pH: 7 (ref 5.0–8.0)

## 2022-06-15 LAB — COMPREHENSIVE METABOLIC PANEL
ALT: 20 U/L (ref 0–35)
AST: 17 U/L (ref 0–37)
Albumin: 4.4 g/dL (ref 3.5–5.2)
Alkaline Phosphatase: 106 U/L (ref 39–117)
BUN: 12 mg/dL (ref 6–23)
CO2: 29 mEq/L (ref 19–32)
Calcium: 10 mg/dL (ref 8.4–10.5)
Chloride: 102 mEq/L (ref 96–112)
Creatinine, Ser: 0.65 mg/dL (ref 0.40–1.20)
GFR: 92.42 mL/min (ref >60.00)
Glucose, Bld: 104 mg/dL — ABNORMAL HIGH (ref 70–99)
Potassium: 4.5 mEq/L (ref 3.5–5.1)
Sodium: 139 mEq/L (ref 135–145)
Total Bilirubin: 0.4 mg/dL (ref 0.2–1.2)
Total Protein: 7 g/dL (ref 6.0–8.3)

## 2022-06-15 LAB — SEDIMENTATION RATE: Sed Rate: 43 mm/hr — ABNORMAL HIGH (ref 0–30)

## 2022-06-15 LAB — CBC
HCT: 40.2 % (ref 36.0–46.0)
Hemoglobin: 12.5 g/dL (ref 12.0–15.0)
MCHC: 31.2 g/dL (ref 30.0–36.0)
MCV: 78.9 fl (ref 78.0–100.0)
Platelets: 453 10*3/uL — ABNORMAL HIGH (ref 150.0–400.0)
RBC: 5.09 Mil/uL (ref 3.87–5.11)
RDW: 18.1 % — ABNORMAL HIGH (ref 11.5–15.5)
WBC: 6.4 10*3/uL (ref 4.0–10.5)

## 2022-06-15 LAB — LIPID PANEL
Cholesterol: 144 mg/dL (ref 0–200)
HDL: 45.6 mg/dL (ref >39.00)
LDL Cholesterol: 78 mg/dL (ref 0–99)
NonHDL: 98.1
Total CHOL/HDL Ratio: 3
Triglycerides: 101 mg/dL (ref 0.0–149.0)
VLDL: 20.2 mg/dL (ref 0.0–40.0)

## 2022-06-15 LAB — HEMOGLOBIN A1C: Hgb A1c MFr Bld: 7.4 % — ABNORMAL HIGH (ref 4.6–6.5)

## 2022-06-15 LAB — TSH: TSH: 0.86 u[IU]/mL (ref 0.35–5.50)

## 2022-06-15 LAB — VITAMIN D 25 HYDROXY (VIT D DEFICIENCY, FRACTURES): VITD: >120 ng/mL (ref 30.00–100.00)

## 2022-06-15 NOTE — Telephone Encounter (Signed)
Have her hold any Vit D/calcium supps for now. Can recheck in a few weeks or at rec of PCP.

## 2022-06-15 NOTE — Telephone Encounter (Signed)
Routing to PCP and Dr Nani Ravens in PCP's absence.  CRITICAL VALUE STICKER  CRITICAL VALUE:  Vit D -- >120  RECEIVER (on-site recipient of call): Kelle Darting, [place] NOTIFIED: 06/15/22  @ 2:15  MESSENGER (representative from lab): Earnest Bailey   MD NOTIFIED:  blyth / Nani Ravens  TIME OF NOTIFICATION: 2:17pm  RESPONSE:

## 2022-06-18 NOTE — Telephone Encounter (Signed)
Called pt was advised  

## 2022-07-16 ENCOUNTER — Other Ambulatory Visit (INDEPENDENT_AMBULATORY_CARE_PROVIDER_SITE_OTHER): Payer: PPO

## 2022-07-16 ENCOUNTER — Other Ambulatory Visit: Payer: Self-pay

## 2022-07-16 ENCOUNTER — Other Ambulatory Visit (INDEPENDENT_AMBULATORY_CARE_PROVIDER_SITE_OTHER): Payer: PPO | Admitting: Family Medicine

## 2022-07-16 ENCOUNTER — Telehealth: Payer: Self-pay

## 2022-07-16 ENCOUNTER — Other Ambulatory Visit: Payer: PPO

## 2022-07-16 DIAGNOSIS — E559 Vitamin D deficiency, unspecified: Secondary | ICD-10-CM

## 2022-07-16 LAB — VITAMIN D 25 HYDROXY (VIT D DEFICIENCY, FRACTURES): VITD: 108.2 ng/mL (ref 30.00–100.00)

## 2022-07-16 NOTE — Progress Notes (Deleted)
Patient scheduled for labs Vit D per Dr. Charlett Blake result note on 06/18/2022.

## 2022-07-16 NOTE — Progress Notes (Deleted)
Patient is scheduled for vit d level per Dr. Charlett Blake from result note dated 06/18/2022.

## 2022-07-16 NOTE — Progress Notes (Signed)
Patient scheduled for Vit D level per Dr. Charlett Blake result note 06/18/2022.

## 2022-07-16 NOTE — Telephone Encounter (Signed)
CRITICAL VALUE STICKER   CRITICAL VALUE: Vitamin D 108.20  RECEIVER (on-site recipient of call): Shed Nixon, RMA  DATE & TIME NOTIFIED:  07/16/2022 @ 2:00 pm  MESSENGER (representative from lab): Festus Holts  MD NOTIFIED: [name]   TIME OF NOTIFICATION: 2:04 pm  RESPONSE:

## 2022-07-16 NOTE — Telephone Encounter (Signed)
Called pt was advised to stop all Vitamin-D supplement  and recheck  Labs in one month. Lap appt made pt stated understand

## 2022-07-17 NOTE — Progress Notes (Signed)
No charge. 

## 2022-07-20 NOTE — Patient Instructions (Signed)
No charge. 

## 2022-07-23 ENCOUNTER — Other Ambulatory Visit: Payer: Self-pay | Admitting: Family Medicine

## 2022-07-30 DIAGNOSIS — H5203 Hypermetropia, bilateral: Secondary | ICD-10-CM | POA: Diagnosis not present

## 2022-07-30 DIAGNOSIS — H2513 Age-related nuclear cataract, bilateral: Secondary | ICD-10-CM | POA: Diagnosis not present

## 2022-07-30 DIAGNOSIS — H40013 Open angle with borderline findings, low risk, bilateral: Secondary | ICD-10-CM | POA: Diagnosis not present

## 2022-07-30 DIAGNOSIS — E119 Type 2 diabetes mellitus without complications: Secondary | ICD-10-CM | POA: Diagnosis not present

## 2022-07-30 LAB — HM DIABETES EYE EXAM

## 2022-08-16 ENCOUNTER — Telehealth: Payer: Self-pay | Admitting: Family Medicine

## 2022-08-16 ENCOUNTER — Other Ambulatory Visit (INDEPENDENT_AMBULATORY_CARE_PROVIDER_SITE_OTHER): Payer: PPO

## 2022-08-16 DIAGNOSIS — E559 Vitamin D deficiency, unspecified: Secondary | ICD-10-CM | POA: Diagnosis not present

## 2022-08-16 LAB — VITAMIN D 25 HYDROXY (VIT D DEFICIENCY, FRACTURES): VITD: 78.73 ng/mL (ref 30.00–100.00)

## 2022-08-16 NOTE — Telephone Encounter (Signed)
Pt dropped off copy of Lab results for provider to look over and have on pt's chart. Document put at front office tray under providers name.

## 2022-08-16 NOTE — Telephone Encounter (Signed)
Have document  in Dr. Charlett Blake bin to  Review

## 2022-08-21 ENCOUNTER — Telehealth: Payer: Self-pay

## 2022-08-21 NOTE — Telephone Encounter (Signed)
done

## 2022-09-07 LAB — HM DIABETES EYE EXAM

## 2022-09-10 ENCOUNTER — Telehealth: Payer: Self-pay | Admitting: Family Medicine

## 2022-09-10 ENCOUNTER — Other Ambulatory Visit: Payer: Self-pay

## 2022-09-10 DIAGNOSIS — E119 Type 2 diabetes mellitus without complications: Secondary | ICD-10-CM | POA: Diagnosis not present

## 2022-09-10 DIAGNOSIS — E042 Nontoxic multinodular goiter: Secondary | ICD-10-CM | POA: Diagnosis not present

## 2022-09-10 DIAGNOSIS — K3184 Gastroparesis: Secondary | ICD-10-CM | POA: Diagnosis not present

## 2022-09-10 LAB — MICROALBUMIN / CREATININE URINE RATIO
Albumin, Urine POC: 0.81
Creatinine, POC: 74 mg/dL
Microalb Creat Ratio: 10.9

## 2022-09-10 NOTE — Telephone Encounter (Signed)
Will recheck at next visit

## 2022-09-10 NOTE — Telephone Encounter (Signed)
Patient is requesting this medication:    WANTS THIS MEDICATION & ALL MEDS TO BE SENT TO :  [place]       Patient is also requesting to recheck Vit D in 3 months... she has a appt for CPE during summer  Can we insert? I will call and make an appt

## 2022-10-01 ENCOUNTER — Ambulatory Visit (INDEPENDENT_AMBULATORY_CARE_PROVIDER_SITE_OTHER): Payer: PPO | Admitting: Family

## 2022-10-01 VITALS — BP 121/54 | HR 88 | Temp 98.0°F | Resp 16 | Wt 172.0 lb

## 2022-10-01 DIAGNOSIS — J019 Acute sinusitis, unspecified: Secondary | ICD-10-CM

## 2022-10-01 DIAGNOSIS — L304 Erythema intertrigo: Secondary | ICD-10-CM | POA: Diagnosis not present

## 2022-10-01 MED ORDER — KETOCONAZOLE 2 % EX CREA: 1.0000 | TOPICAL_CREAM | Freq: Every day | CUTANEOUS | 1 refills | Status: DC

## 2022-10-01 MED ORDER — AZITHROMYCIN 250 MG PO TABS: ORAL_TABLET | ORAL | 0 refills | Status: AC

## 2022-10-01 NOTE — Assessment & Plan Note (Addendum)
Uncontrolled. Reports flare up of itching beneath her breasts which has responded well in the past to ketaconazole cream.  Rx provided.

## 2022-10-01 NOTE — Progress Notes (Signed)
Subjective:   By signing my name below, I, Lori Zamora, attest that this documentation has been prepared under the direction and in the presence of Lori Alar, NP. 10/01/2022   Patient ID: Lori Zamora, female    DOB: [birth year], 67 y.o.   MRN: [id number]  Chief Complaint  Patient presents with   Nasal Congestion    Complains of nasal/ sinus congestion for about one week.     HPI Patient is in today for an office visit.  Sinus: She has been experiencing an itchy throat and eyes, congestion, sneezing, and rhinorrhea. She denies pain and pressure in the face or cheeks. Every 6 months or so, she gets sinus congestion. Her main concern today is ongoing sinus congestion. Reports sinus symptoms have been present x 8 days ago.  Has been using nasal saline spray without improvement in her symptoms.   Skin: She has itching under her breasts. She denies any rash.  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   Bipolar 2 disorder ([place]) 01/31/2010   Qualifier: Diagnosis of  By: Fuller Plan Zamora (AAMA), Lori  Follows with Dr Lori Zamora of psychiatry    Bipolar disorder, unspecified ([place])    CFS (chronic fatigue syndrome)    Degeneration of intervertebral disc, site unspecified    Depressive disorder, not elsewhere classified    Esophageal reflux    FH: breast cancer 05/20/2017   Gastroparesis 06/07/2015   Hyperglycemia 01/20/2018   Intertriginous candidiasis 11/29/2013   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   Microcytic anemia 01/31/2010   Qualifier: Diagnosis of  By: Fuller Plan Zamora (AAMA), Lori     Migraine with aura, without mention of intractable migraine without mention of status migrainosus    Myalgia and myositis, unspecified    Nontoxic multinodular goiter    Other and unspecified hyperlipidemia    Other malaise and fatigue     Other specified iron deficiency anemias    Other thalassemia ([place])    Parotid gland enlargement 2011   related to connective tissue syndrome   Pelvic floor dysfunction 01/27/2013   Peripheral edema 02/19/2017   Preventative health care 09/12/2014   Renal insufficiency 01/27/2013   Sjogren's syndrome ([place])    Small intestinal bacterial overgrowth 11/29/2013   Systemic lupus erythematosus ([place])    Thrombocytosis 06/07/2016   Type II or unspecified type diabetes mellitus without mention of complication, not stated as uncontrolled    Unspecified constipation    Unspecified diffuse connective tissue disease 01/27/2013   Follows at [facility]ology, Dr Alanda Amass Per patient has tested 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 antibody   Unspecified essential hypertension    Unspecified sleep apnea    Unspecified vitamin D deficiency     Past Surgical History:  Procedure Laterality Date   BIOPSY THYROID     01/13/2015, 3 previous biopsy reported all benign   LAPAROSCOPIC TOTAL HYSTERECTOMY  2004    Family History  Problem Relation Age of Onset   Diabetes Mother    Heart disease Mother        family history   Allergies Mother    Hypertension Mother    Alcohol abuse Other        Family history of alcoholism and addiction   Hypertension Father    Other Father        viral  meningitis   Alcohol abuse Brother        drug abuse   Hypertension Brother    Stroke Sister        4 in 2019   Hypertension Sister    Brain cancer Sister        glioblastoma   Cancer Sister        renal cancer   Cancer Sister 99       breast   Breast cancer Sister 78   Heart disease Maternal Grandmother    Heart disease Maternal Grandfather    Hypertension Other        family history   Kidney disease Other        family history   Stroke Other        1st degree relative <60    Social History   Socioeconomic History   Marital status: Single    Spouse name: Not on  file   Number of children: 0   Years of education: Not on file   Highest education level: Not on file  Occupational History   Occupation: Pharmacist, hospital  Tobacco Use   Smoking status: Never   Smokeless tobacco: Never  Vaping Use   Vaping Use: Not on file  Substance and Sexual Activity   Alcohol use: No   Drug use: No   Sexual activity: Never    Birth control/protection: None    Comment: lives alone, no major dietary restrictions, retired from teaching kindergarten  Other Topics Concern   Not on file  Social History Narrative   Former Pharmacist, hospital, on disability   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 180 tablet 1   Ascorbic Acid (VITAMIN C) 1000 MG tablet Take 1 tablet (1,000 mg total) by mouth daily.     aspirin EC 81 MG tablet Take 1 tablet (81 mg total) by mouth daily.     atorvastatin (LIPITOR) 20 MG tablet TAKE 3 TABLETS(60 MG) BY MOUTH DAILY 270 tablet 1   cholecalciferol (VITAMIN D) 1000 UNITS tablet Take 2,000 Units by mouth daily.     diphenhydrAMINE HCl, Sleep, 50 MG tablet Take by mouth.     empagliflozin (JARDIANCE) 10 MG TABS tablet Take 10 mg by mouth daily.     folic acid (FOLVITE) 1 MG tablet TAKE 1 TABLET(1 MG) BY MOUTH DAILY 90 tablet 3   Lidocaine, Anorectal, 5 % CREA Apply topically as needed.     losartan (COZAAR) 25 MG tablet TAKE 1 TABLET(25 MG) BY MOUTH DAILY 90 tablet 1   Magnesium Oxide 500 MG TABS Take by mouth.     metFORMIN (GLUCOPHAGE) 500 MG tablet Take 1,000 mg by mouth 2 (two) times daily with a meal.     Mouthwashes  (BIOTENE/CALCIUM PBF) LIQD by Transmucosal route.     NON FORMULARY Lubricant eye ointment- sterile mineral oil 39.9% White Petroleum 57.7%     NON FORMULARY Biotene dry mouth oral rinse     omeprazole (PRILOSEC) 20 MG capsule TAKE 1 CAPSULE(20 MG) BY MOUTH DAILY  90 capsule 3   ONETOUCH VERIO test strip CHECK BLOOD SUGAR IN VITRO TWICE DAILY 200 strip 2   Polyethyl Glycol-Propyl Glycol (SYSTANE) 0.4-0.3 % GEL ophthalmic gel Apply to eye.     PRESCRIPTION MEDICATION prevident 5000- dry mouth toothpaste     Probiotic Product (DIGESTIVE ADVANTAGE GUMMIES PO) Take by mouth 2 (two) times daily.     RA SUNSCREEN SPF50 EX Apply topically as needed.     zinc gluconate 50 MG tablet Take 50 mg by mouth daily.     ketoconazole (NIZORAL) 2 % cream Apply 1 Application topically daily.     No facility-administered medications prior to visit.    Allergies  Allergen Reactions   Penicillins Swelling and Anaphylaxis    Swelling of tongue.   Sulfa Antibiotics Other (See Comments) and Rash    Other Reaction: swellin of tongue Other Reaction: swellin of tongue   Sulfonamide Derivatives Swelling    REACTION: Swelling of tongue and face   Acetazolamide    Azathioprine Other (See Comments)    Very low TPMT in deficient range Unknown   Ezetimibe Other (See Comments)    Unknown   Lisinopril Other (See Comments)    Unknown   Meloxicam Other (See Comments)    Unknown   Pravastatin Sodium Other (See Comments)    Unknown   Simvastatin Other (See Comments)    Unknown   Latex Hives and Rash    Review of Systems  HENT:  Positive for congestion. Negative for sinus pain.        (+)rhinorrhea (+)sneezing  Eyes:        (+)itching eyes  Skin:  Positive for itching (under both breasts). Negative for rash (under breasts).       Objective:    Physical Exam Constitutional:      General: She is not in acute distress.    Appearance: Normal appearance.  HENT:     Head: Normocephalic and atraumatic.      Right Ear: Tympanic membrane, ear canal and external ear normal.     Left Ear: Tympanic membrane, ear canal and external ear normal.     Nose: Rhinorrhea present.     Mouth/Throat:     Mouth: Mucous membranes are moist.     Pharynx: Oropharynx is clear. No posterior oropharyngeal erythema.  Eyes:     Extraocular Movements: Extraocular movements intact.     Pupils: Pupils are equal, round, and reactive to light.  Cardiovascular:     Rate and Rhythm: Normal rate and regular rhythm.     Heart sounds: Normal heart sounds. No murmur heard.    No gallop.  Pulmonary:     Effort: Pulmonary effort is normal. No respiratory distress.     Breath sounds: Normal breath sounds. No wheezing or rales.  Lymphadenopathy:     Cervical: No cervical adenopathy.  Skin:    General: Skin is warm.     Comments: Itching under breasts   Neurological:     Mental Status: She is alert and oriented to person, place, and time.  Psychiatric:        Judgment: Judgment normal.     BP (!) 121/54 (BP Location: Right Arm, Patient Position: Sitting, Cuff Size: Small)   Pulse 88   Temp 98 F (36.7 C) (Oral)   Resp 16   Wt 172 lb (78 kg)   SpO2 100%   BMI 28.62 kg/m  Wt Readings from Last 3 Encounters:  10/01/22 172 lb (78 kg)  06/07/22 170 lb (77.1 kg)  12/08/21 169 lb 12.8 oz (77 kg)       Assessment & Plan:  [place] Assessment & Plan: Uncontrolled. Reports flare up of itching beneath her breasts which has responded well in the past to ketaconazole cream.  Rx provided.   Acute sinusitis, recurrence not specified, unspecified location Assessment & Plan: New. Not responding to supportive measures.  Will rx with azithromycin. Pt has multiple drug allergies.    Other orders -     Azithromycin; Take 2 tablets on day 1, then 1 tablet daily on days 2 through 5  Dispense: 6 tablet; Refill: 0 -     Ketoconazole; Apply 1 Application topically daily.  Dispense: 30 g; Refill: 1    I, Nance Pear, NP, personally preformed the services described in this documentation.  All medical record entries made by the scribe were at my direction and in my presence.  I have reviewed the chart and discharge instructions (if applicable) and agree that the record reflects my personal performance and is accurate and complete. 10/01/2022   Lacretia Leigh as a scribe for Nance Pear, NP.,have documented all relevant documentation on the behalf of Nance Pear, NP,as directed by  Nance Pear, NP while in the presence of Nance Pear, NP.   Nance Pear, NP

## 2022-10-01 NOTE — Assessment & Plan Note (Signed)
New. Not responding to supportive measures.  Will rx with azithromycin. Pt has multiple drug allergies.

## 2022-10-26 ENCOUNTER — Telehealth: Payer: Self-pay | Admitting: Family Medicine

## 2022-10-26 NOTE — Telephone Encounter (Signed)
Prescription Request  10/26/2022  Is this a "Controlled Substance" medicine? No  LOV: 06/07/2022  What is the name of the medication or equipment?   losartan (COZAAR) 25 MG tablet [id number]   Have you contacted your pharmacy to request a refill? Yes   Which pharmacy would you like this sent to?   [facility] [place], [place] - [address] [place] [address] Phone: [phone] Fax: [phone]    Patient notified that their request is being sent to the clinical staff for review and that they should receive a response within 2 business days.   Please advise at Mobile [phone] (mobile)

## 2022-10-29 ENCOUNTER — Other Ambulatory Visit: Payer: Self-pay

## 2022-10-29 MED ORDER — LOSARTAN POTASSIUM 25 MG PO TABS: ORAL_TABLET | ORAL | 1 refills | Status: DC

## 2022-10-29 NOTE — Telephone Encounter (Signed)
Rx sent 

## 2022-10-29 NOTE — Telephone Encounter (Signed)
Pt wanted rx sent to [facility].   [facility] [address] [place], [place] - [address] [place] [address] Phone: [phone] Fax: [phone]

## 2022-10-29 NOTE — Telephone Encounter (Signed)
Medication sent.

## 2022-12-04 ENCOUNTER — Other Ambulatory Visit: Payer: Self-pay | Admitting: Family Medicine

## 2022-12-10 DIAGNOSIS — K3184 Gastroparesis: Secondary | ICD-10-CM | POA: Diagnosis not present

## 2022-12-10 DIAGNOSIS — E119 Type 2 diabetes mellitus without complications: Secondary | ICD-10-CM | POA: Diagnosis not present

## 2022-12-10 DIAGNOSIS — E042 Nontoxic multinodular goiter: Secondary | ICD-10-CM | POA: Diagnosis not present

## 2022-12-13 ENCOUNTER — Encounter: Payer: HMO | Admitting: Family Medicine

## 2023-01-20 ENCOUNTER — Other Ambulatory Visit: Payer: Self-pay | Admitting: Family Medicine

## 2023-01-30 NOTE — Assessment & Plan Note (Signed)
Well controlled, no changes to meds. Encouraged heart healthy diet such as the DASH diet and exercise as tolerated.  °

## 2023-01-30 NOTE — Assessment & Plan Note (Signed)
Encourage heart healthy diet such as MIND or DASH diet, increase exercise, avoid trans fats, simple carbohydrates and processed foods, consider a krill or fish or flaxseed oil cap daily. Tolerating Atorvastatin 

## 2023-01-30 NOTE — Assessment & Plan Note (Signed)
Supplement and monitor 

## 2023-01-30 NOTE — Assessment & Plan Note (Signed)
hgba1c acceptable, minimize simple carbs. Increase exercise as tolerated. Continue current meds 

## 2023-01-30 NOTE — Assessment & Plan Note (Signed)
Hydrate and monitor 

## 2023-01-30 NOTE — Assessment & Plan Note (Signed)
Patient encouraged to maintain heart healthy diet, regular exercise, adequate sleep. Consider daily probiotics. Take medications as prescribed. Labs ordered and reviewed. Given and reviewed copy of ACP documents from [facility] Secretary of [place] and encouraged to complete and return  Colonoscopy 2019 repeat in 2029 Pap Mgm May 2023 Dexa 2019 repeat this year Consider Prevnar 20. RSV (respiratory syncitial virus) vaccine at pharmacy Covid booster in fall High dose flu shot in fall Tetanus due 2025 or if injury occurs.  Shingrix is the new shingles shot, 2 shots over 2-6 months, confirm coverage with insurance and document, then can return here for shots with nurse appt or at pharmacy

## 2023-01-31 ENCOUNTER — Encounter: Payer: Self-pay | Admitting: Family Medicine

## 2023-01-31 ENCOUNTER — Ambulatory Visit (INDEPENDENT_AMBULATORY_CARE_PROVIDER_SITE_OTHER): Payer: PPO | Admitting: Family Medicine

## 2023-01-31 ENCOUNTER — Other Ambulatory Visit (HOSPITAL_BASED_OUTPATIENT_CLINIC_OR_DEPARTMENT_OTHER): Payer: Self-pay | Admitting: Family Medicine

## 2023-01-31 VITALS — BP 122/68 | HR 86 | Temp 97.7°F | Resp 16 | Ht 65.0 in | Wt 172.8 lb

## 2023-01-31 DIAGNOSIS — Z0000 Encounter for general adult medical examination without abnormal findings: Secondary | ICD-10-CM | POA: Diagnosis not present

## 2023-01-31 DIAGNOSIS — I10 Essential (primary) hypertension: Secondary | ICD-10-CM

## 2023-01-31 DIAGNOSIS — E559 Vitamin D deficiency, unspecified: Secondary | ICD-10-CM

## 2023-01-31 DIAGNOSIS — J309 Allergic rhinitis, unspecified: Secondary | ICD-10-CM | POA: Diagnosis not present

## 2023-01-31 DIAGNOSIS — E1159 Type 2 diabetes mellitus with other circulatory complications: Secondary | ICD-10-CM | POA: Diagnosis not present

## 2023-01-31 DIAGNOSIS — E782 Mixed hyperlipidemia: Secondary | ICD-10-CM

## 2023-01-31 DIAGNOSIS — Z1231 Encounter for screening mammogram for malignant neoplasm of breast: Secondary | ICD-10-CM

## 2023-01-31 DIAGNOSIS — N289 Disorder of kidney and ureter, unspecified: Secondary | ICD-10-CM | POA: Diagnosis not present

## 2023-01-31 DIAGNOSIS — Z7984 Long term (current) use of oral hypoglycemic drugs: Secondary | ICD-10-CM | POA: Diagnosis not present

## 2023-01-31 LAB — COMPREHENSIVE METABOLIC PANEL
ALT: 13 U/L (ref 0–35)
AST: 12 U/L (ref 0–37)
Albumin: 4.4 g/dL (ref 3.5–5.2)
Alkaline Phosphatase: 117 U/L (ref 39–117)
BUN: 11 mg/dL (ref 6–23)
CO2: 27 mEq/L (ref 19–32)
Calcium: 9.7 mg/dL (ref 8.4–10.5)
Chloride: 103 mEq/L (ref 96–112)
Creatinine, Ser: 0.69 mg/dL (ref 0.40–1.20)
GFR: 90.7 mL/min (ref >60.00)
Glucose, Bld: 97 mg/dL (ref 70–99)
Potassium: 4.1 mEq/L (ref 3.5–5.1)
Sodium: 140 mEq/L (ref 135–145)
Total Bilirubin: 0.5 mg/dL (ref 0.2–1.2)
Total Protein: 7.3 g/dL (ref 6.0–8.3)

## 2023-01-31 LAB — CBC WITH DIFFERENTIAL/PLATELET
Basophils Absolute: 0.1 10*3/uL (ref 0.0–0.1)
Basophils Relative: 0.6 % (ref 0.0–3.0)
Eosinophils Absolute: 0.1 10*3/uL (ref 0.0–0.7)
Eosinophils Relative: 0.9 % (ref 0.0–5.0)
HCT: 38.7 % (ref 36.0–46.0)
Hemoglobin: 12 g/dL (ref 12.0–15.0)
Lymphocytes Relative: 26.5 % (ref 12.0–46.0)
Lymphs Abs: 2.3 10*3/uL (ref 0.7–4.0)
MCHC: 31 g/dL (ref 30.0–36.0)
MCV: 80.1 fl (ref 78.0–100.0)
Monocytes Absolute: 0.5 10*3/uL (ref 0.1–1.0)
Monocytes Relative: 5.4 % (ref 3.0–12.0)
Neutro Abs: 5.9 10*3/uL (ref 1.4–7.7)
Neutrophils Relative %: 66.6 % (ref 43.0–77.0)
Platelets: 490 10*3/uL — ABNORMAL HIGH (ref 150.0–400.0)
RBC: 4.84 Mil/uL (ref 3.87–5.11)
RDW: 16.2 % — ABNORMAL HIGH (ref 11.5–15.5)
WBC: 8.8 10*3/uL (ref 4.0–10.5)

## 2023-01-31 LAB — LIPID PANEL
Cholesterol: 146 mg/dL (ref 0–200)
HDL: 39.8 mg/dL (ref >39.00)
LDL Cholesterol: 87 mg/dL (ref 0–99)
NonHDL: 105.98
Total CHOL/HDL Ratio: 4
Triglycerides: 94 mg/dL (ref 0.0–149.0)
VLDL: 18.8 mg/dL (ref 0.0–40.0)

## 2023-01-31 LAB — VITAMIN D 25 HYDROXY (VIT D DEFICIENCY, FRACTURES): VITD: 64.08 ng/mL (ref 30.00–100.00)

## 2023-01-31 LAB — TSH: TSH: 0.97 u[IU]/mL (ref 0.35–5.50)

## 2023-01-31 NOTE — Patient Instructions (Addendum)
Consider Zyrtec, Flonase, Astelin and/or Singulair if worsens (for allergies)  Consider Hydroxyzine/Atarax for the itching  Prevnar 20 pneumonia shot RSV Respiratory Syncitial Virus Vaccine, Arexvy at pharmacy Tetanus booster due in 2015 unless injured and then take early Shingrix is the new shingles shot, 2 shots over 2-6 months, confirm coverage with insurance and document, then can return here for shots with nurse appt or at pharmacy   Preventive Care 65 Years and Older, Female Preventive care refers to lifestyle choices and visits with your health care provider that can promote health and wellness. Preventive care visits are also called wellness exams. What can I expect for my preventive care visit? Counseling Your health care provider may ask you questions about your: Medical history, including: Past medical problems. Family medical history. Pregnancy and menstrual history. History of falls. Current health, including: Memory and ability to understand (cognition). Emotional well-being. Home life and relationship well-being. Sexual activity and sexual health. Lifestyle, including: Alcohol, nicotine or tobacco, and drug use. Access to firearms. Diet, exercise, and sleep habits. Work and work Astronomer. Sunscreen use. Safety issues such as seatbelt and bike helmet use. Physical exam Your health care provider will check your: Height and weight. These may be used to calculate your BMI (body mass index). BMI is a measurement that tells if you are at a healthy weight. Waist circumference. This measures the distance around your waistline. This measurement also tells if you are at a healthy weight and may help predict your risk of certain diseases, such as type 2 diabetes and high blood pressure. Heart rate and blood pressure. Body temperature. Skin for abnormal spots. What immunizations do I need?  Vaccines are usually given at various ages, according to a schedule. Your health  care provider will recommend vaccines for you based on your age, medical history, and lifestyle or other factors, such as travel or where you work. What tests do I need? Screening Your health care provider may recommend screening tests for certain conditions. This may include: Lipid and cholesterol levels. Hepatitis C test. Hepatitis B test. HIV (human immunodeficiency virus) test. STI (sexually transmitted infection) testing, if you are at risk. Lung cancer screening. Colorectal cancer screening. Diabetes screening. This is done by checking your blood sugar (glucose) after you have not eaten for a while (fasting). Mammogram. Talk with your health care provider about how often you should have regular mammograms. BRCA-related cancer screening. This may be done if you have a family history of breast, ovarian, tubal, or peritoneal cancers. Bone density scan. This is done to screen for osteoporosis. Talk with your health care provider about your test results, treatment options, and if necessary, the need for more tests. Follow these instructions at home: Eating and drinking  Eat a diet that includes fresh fruits and vegetables, whole grains, lean protein, and low-fat dairy products. Limit your intake of foods with high amounts of sugar, saturated fats, and salt. Take vitamin and mineral supplements as recommended by your health care provider. Do not drink alcohol if your health care provider tells you not to drink. If you drink alcohol: Limit how much you have to 0-1 drink a day. Know how much alcohol is in your drink. In the [place] one drink equals one 12 oz bottle of beer (355 mL), one 5 oz glass of wine (148 mL), or one 1 oz glass of hard liquor (44 mL). Lifestyle Brush your teeth every morning and night with fluoride toothpaste. Floss one time each day. Exercise for at least  30 minutes 5 or more days each week. Do not use any products that contain nicotine or tobacco. These products  include cigarettes, chewing tobacco, and vaping devices, such as e-cigarettes. If you need help quitting, ask your health care provider. Do not use drugs. If you are sexually active, practice safe sex. Use a condom or other form of protection in order to prevent STIs. Take aspirin only as told by your health care provider. Make sure that you understand how much to take and what form to take. Work with your health care provider to find out whether it is safe and beneficial for you to take aspirin daily. Ask your health care provider if you need to take a cholesterol-lowering medicine (statin). Find healthy ways to manage stress, such as: Meditation, yoga, or listening to music. Journaling. Talking to a trusted person. Spending time with friends and family. Minimize exposure to UV radiation to reduce your risk of skin cancer. Safety Always wear your seat belt while driving or riding in a vehicle. Do not drive: If you have been drinking alcohol. Do not ride with someone who has been drinking. When you are tired or distracted. While texting. If you have been using any mind-altering substances or drugs. Wear a helmet and other protective equipment during sports activities. If you have firearms in your house, make sure you follow all gun safety procedures. What's next? Visit your health care provider once a year for an annual wellness visit. Ask your health care provider how often you should have your eyes and teeth checked. Stay up to date on all vaccines. This information is not intended to replace advice given to you by your health care provider. Make sure you discuss any questions you have with your health care provider. Document Revised: 01/25/2021 Document Reviewed: 01/25/2021 Elsevier Patient Education  2024 [employer].

## 2023-01-31 NOTE — Progress Notes (Signed)
Subjective:    Patient ID: Lori Zamora, female    DOB: [birth year], 67 y.o.   MRN: [id number]  Chief Complaint  Patient presents with  . Annual Exam    Annual Exam    HPI Discussed the use of AI scribe software for clinical note transcription with the patient, who gave verbal consent to proceed.  History of Present Illness   The patient, with a history of sinus issues, vitamin D deficiency, thyroid enlargement, and fibroids, presents with itching and gastroparesis. They report a recent increase in itching, which they describe as severe enough to cause scratching during sleep and resulting in welts and spots. The patient has tried various creams and antihistamines with limited success. They also report gastroparesis symptoms, including slow digestion and infrequent bowel movements, which have been more prevalent recently.  The patient also discusses their blood sugar levels, which have been slightly higher since they stopped taking Jardiance due to insurance issues. They are currently managing their diabetes with metformin. Their fasting blood sugar levels vary between 119 and 134.  The patient also mentions a history of sinus issues, which they believe are due to winter allergies. They report that these issues typically resolve on their own without the need for antibiotics. They also mention a history of vitamin D deficiency, which they are managing with a weekly dose of 7000 units. They have a history of thyroid enlargement, which they report has been stable, and a history of fibroids, which required a hysterectomy.        Past Medical History:  Diagnosis Date  . Abdominal pain, unspecified site 11/29/2013  . Acquired acanthosis nigricans   . Anemia, unspecified   . 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 Bipolar 2 disorder (HCC) 01/31/2010   Qualifier: Diagnosis of  By: Etheleen Mayhew CMA (AAMA), Laurette Schimke  Follows with Dr Emerson Monte of psychiatry   . Bipolar disorder,  unspecified (HCC)   . CFS (chronic fatigue syndrome)   . Degeneration of intervertebral disc, site unspecified   . Depressive disorder, not elsewhere classified   . Esophageal reflux   . FH: breast cancer 05/20/2017  . Gastroparesis 06/07/2015  . Hyperglycemia 01/20/2018  . Intertriginous candidiasis 11/29/2013  . 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 Microcytic anemia 01/31/2010   Qualifier: Diagnosis of  By: Etheleen Mayhew CMA (AAMA), [place]    . Migraine with aura, without mention of intractable migraine without mention of status migrainosus   . Myalgia and myositis, unspecified   . Nontoxic multinodular goiter   . Other and unspecified hyperlipidemia   . Other malaise and fatigue   . Other specified iron deficiency anemias   . Other thalassemia (HCC)   . Parotid gland enlargement 2011   related to connective tissue syndrome  . Pelvic floor dysfunction 01/27/2013  . Peripheral edema 02/19/2017  . Preventative health care 09/12/2014  . Renal insufficiency 01/27/2013  . Sjogren's syndrome (HCC)   . Small intestinal bacterial overgrowth 11/29/2013  . Systemic lupus erythematosus (HCC)   . Thrombocytosis 06/07/2016  . Type II or unspecified type diabetes mellitus without mention of complication, not stated as uncontrolled   . Unspecified constipation   . 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 Unspecified essential hypertension   . Unspecified sleep apnea   . Unspecified vitamin D deficiency     Past Surgical History:  Procedure Laterality Date  . BIOPSY THYROID     01/13/2015, 3 previous biopsy reported all benign  . LAPAROSCOPIC TOTAL HYSTERECTOMY  2004    Family History  Problem Relation Age of Onset  . Diabetes Mother   . Heart disease Mother        family history  . Allergies Mother   . Hypertension Mother    . Hypertension Father   . Other Father        viral meningitis  . Stroke Sister        4 in 2019  . Hypertension Sister   . Brain cancer Sister        glioblastoma  . Cancer Sister        renal cancer  . Cancer Sister 31       breast  . Breast cancer Sister 42  . Alcohol abuse Brother        drug abuse  . Hypertension Brother   . Heart disease Maternal Grandmother   . Heart disease Maternal Grandfather   . Alcohol abuse Other        Family history of alcoholism and addiction  . Hypertension Other        family history  . Kidney disease Other        family history  . Stroke Other        1st degree relative <60    Social History   Socioeconomic History  . Marital status: Single    Spouse name: Not on file  . Number of children: 0  . Years of education: Not on file  . Highest education level: Not on file  Occupational History  . Occupation: Runner, broadcasting/film/video  Tobacco Use  . Smoking status: Never  . Smokeless tobacco: Never  Vaping Use  . Vaping Use: Not on file  Substance and Sexual Activity  . Alcohol use: No  . Drug use: No  . Sexual activity: Never    Birth control/protection: None    Comment: lives alone, no major dietary restrictions, retired from teaching kindergarten  Other Topics Concern  . 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 amLODipine (NORVASC) 5 MG tablet TAKE 1 TABLET BY MOUTH IN THE MORNING AND 1 AT BEDTIME 180 tablet 0  . Ascorbic Acid (VITAMIN C) 1000 MG tablet Take 1 tablet (1,000 mg total) by mouth daily.    Marland Kitchen aspirin EC 81 MG tablet Take 1 tablet  (81 mg total) by mouth daily.    Marland Kitchen atorvastatin (LIPITOR) 20 MG tablet Take 3 tablets by mouth once daily 270 tablet 0  . cholecalciferol (VITAMIN D) 1000 UNITS tablet Take 2,000 Units by mouth daily.    . diphenhydrAMINE HCl, Sleep, 50 MG tablet Take by mouth.    . folic acid (FOLVITE) 1 MG tablet TAKE 1 TABLET(1 MG) BY MOUTH DAILY 90 tablet 3  . ketoconazole (NIZORAL) 2 % cream Apply 1 Application topically daily. 30 g 1  . Lidocaine, Anorectal, 5 % CREA Apply topically as needed.    Marland Kitchen losartan (COZAAR) 25  MG tablet TAKE 1 TABLET(25 MG) BY MOUTH DAILY 90 tablet 1  . Magnesium Oxide 500 MG TABS Take by mouth.    . metFORMIN (GLUCOPHAGE) 500 MG tablet Take 1,000 mg by mouth 2 (two) times daily with a meal.    . Mouthwashes (BIOTENE/CALCIUM PBF) LIQD by Transmucosal route.    . NON FORMULARY Lubricant eye ointment- sterile mineral oil 39.9% White Petroleum 57.7%    . NON FORMULARY Biotene dry mouth oral rinse    . omeprazole (PRILOSEC) 20 MG capsule TAKE 1 CAPSULE(20 MG) BY MOUTH DAILY 90 capsule 3  . ONETOUCH VERIO test strip CHECK BLOOD SUGAR IN VITRO TWICE DAILY 200 strip 2  . Polyethyl Glycol-Propyl Glycol (SYSTANE) 0.4-0.3 % GEL ophthalmic gel Apply to eye.    Marland Kitchen PRESCRIPTION MEDICATION prevident 5000- dry mouth toothpaste    . Probiotic Product (DIGESTIVE ADVANTAGE GUMMIES PO) Take by mouth 2 (two) times daily.    Marland Kitchen RA SUNSCREEN SPF50 EX Apply topically as needed.    . zinc gluconate 50 MG tablet Take 50 mg by mouth daily.    . empagliflozin (JARDIANCE) 10 MG TABS tablet Take 10 mg by mouth daily.     No facility-administered medications prior to visit.    Allergies  Allergen Reactions  . Penicillins Swelling and Anaphylaxis    Swelling of tongue.  . Sulfa Antibiotics Other (See Comments) and Rash    Other Reaction: swellin of tongue Other Reaction: swellin of tongue  . Sulfonamide Derivatives Swelling    REACTION: Swelling of tongue and face  . Acetazolamide   . Azathioprine  Other (See Comments)    Very low TPMT in deficient range Unknown  . Ezetimibe Other (See Comments)    Unknown  . Lisinopril Other (See Comments)    Unknown  . Meloxicam Other (See Comments)    Unknown  . Pravastatin Sodium Other (See Comments)    Unknown  . Simvastatin Other (See Comments)    Unknown  . Latex Hives and Rash    Review of Systems  Constitutional:  Negative for chills, fever and malaise/fatigue.  HENT:  Negative for congestion and hearing loss.   Eyes:  Negative for discharge.  Respiratory:  Negative for cough, sputum production and shortness of breath.   Cardiovascular:  Negative for chest pain, palpitations and leg swelling.  Gastrointestinal:  Positive for nausea. Negative for abdominal pain, blood in stool, constipation, diarrhea, heartburn, melena and vomiting.  Genitourinary:  Negative for dysuria, frequency, hematuria and urgency.  Musculoskeletal:  Negative for back pain, falls and myalgias.  Skin:  Positive for itching and rash.  Neurological:  Negative for dizziness, sensory change, loss of consciousness, weakness and headaches.  Endo/Heme/Allergies:  Negative for environmental allergies. Does not bruise/bleed easily.  Psychiatric/Behavioral:  Negative for depression and suicidal ideas. The patient is not nervous/anxious and does not have insomnia.       Objective:    Physical Exam Constitutional:      General: She is not in acute distress.    Appearance: Normal appearance. She is not diaphoretic.  HENT:     Head: Normocephalic and atraumatic.     Right Ear: Tympanic membrane, ear canal and external ear normal.     Left Ear: Tympanic membrane, ear canal and external ear normal.     Nose: Nose normal.     Mouth/Throat:     Mouth: Mucous membranes are moist.     Pharynx: Oropharynx is clear. No oropharyngeal exudate.  Eyes:  General: No scleral icterus.       Right eye: No discharge.        Left eye: No discharge.     Conjunctiva/sclera:  Conjunctivae normal.     Pupils: Pupils are equal, round, and reactive to light.  Neck:     Thyroid: No thyromegaly.  Cardiovascular:     Rate and Rhythm: Normal rate and regular rhythm.     Heart sounds: Normal heart sounds. No murmur heard. Pulmonary:     Effort: Pulmonary effort is normal. No respiratory distress.     Breath sounds: Normal breath sounds. No wheezing or rales.  Abdominal:     General: Bowel sounds are normal. There is no distension.     Palpations: Abdomen is soft. There is no mass.     Tenderness: There is no abdominal tenderness.  Musculoskeletal:        General: No tenderness. Normal range of motion.     Cervical back: Normal range of motion and neck supple.  Lymphadenopathy:     Cervical: No cervical adenopathy.  Skin:    General: Skin is warm and dry.     Findings: Rash present.     Comments: Scattered raised lesions on bilateral arms flesh colored  Neurological:     General: No focal deficit present.     Mental Status: She is alert and oriented to person, place, and time.     Cranial Nerves: No cranial nerve deficit.     Coordination: Coordination normal.     Deep Tendon Reflexes: Reflexes are normal and symmetric. Reflexes normal.  Psychiatric:        Mood and Affect: Mood normal.        Behavior: Behavior normal.        Thought Content: Thought content normal.        Judgment: Judgment normal.   BP 122/68 (BP Location: Left Arm, Patient Position: Sitting, Cuff Size: Normal)   Pulse 86   Temp 97.7 F (36.5 C) (Oral)   Resp 16   Ht 5\' 5"  (1.651 m)   Wt 172 lb 12.8 oz (78.4 kg)   SpO2 96%   BMI 28.76 kg/m  Wt Readings from Last 3 Encounters:  01/31/23 172 lb 12.8 oz (78.4 kg)  10/01/22 172 lb (78 kg)  06/07/22 170 lb (77.1 kg)    Diabetic Foot Exam - Simple   No data filed    Lab Results  Component Value Date   WBC 8.8 01/31/2023   HGB 12.0 01/31/2023   HCT 38.7 01/31/2023   PLT 490.0 (H) 01/31/2023   GLUCOSE 97 01/31/2023   CHOL  146 01/31/2023   TRIG 94.0 01/31/2023   HDL 39.80 01/31/2023   LDLDIRECT 95 12/22/2009   LDLCALC 87 01/31/2023   ALT 13 01/31/2023   AST 12 01/31/2023   NA 140 01/31/2023   K 4.1 01/31/2023   CL 103 01/31/2023   CREATININE 0.69 01/31/2023   BUN 11 01/31/2023   CO2 27 01/31/2023   TSH 0.97 01/31/2023   HGBA1C 7.4 (H) 06/15/2022   MICROALBUR 1.6 10/16/2019    Lab Results  Component Value Date   TSH 0.97 01/31/2023   Lab Results  Component Value Date   WBC 8.8 01/31/2023   HGB 12.0 01/31/2023   HCT 38.7 01/31/2023   MCV 80.1 01/31/2023   PLT 490.0 (H) 01/31/2023   Lab Results  Component Value Date   NA 140 01/31/2023   K 4.1 01/31/2023   CO2 27  01/31/2023   GLUCOSE 97 01/31/2023   BUN 11 01/31/2023   CREATININE 0.69 01/31/2023   BILITOT 0.5 01/31/2023   ALKPHOS 117 01/31/2023   AST 12 01/31/2023   ALT 13 01/31/2023   PROT 7.3 01/31/2023   ALBUMIN 4.4 01/31/2023   CALCIUM 9.7 01/31/2023   ANIONGAP 10 08/22/2018   GFR 90.70 01/31/2023   Lab Results  Component Value Date   CHOL 146 01/31/2023   Lab Results  Component Value Date   HDL 39.80 01/31/2023   Lab Results  Component Value Date   LDLCALC 87 01/31/2023   Lab Results  Component Value Date   TRIG 94.0 01/31/2023   Lab Results  Component Value Date   CHOLHDL 4 01/31/2023   Lab Results  Component Value Date   HGBA1C 7.4 (H) 06/15/2022       Assessment & Plan:  Essential hypertension Assessment & Plan: Well controlled, no changes to meds. Encouraged heart healthy diet such as the DASH diet and exercise as tolerated.   Orders: -     CBC with Differential/Platelet -     Comprehensive metabolic panel -     TSH  Vitamin D deficiency Assessment & Plan: Supplement and monitor  Orders: -     VITAMIN D 25 Hydroxy (Vit-D Deficiency, Fractures)  Hyperlipidemia, mixed Assessment & Plan: Encourage heart healthy diet such as MIND or DASH diet, increase exercise, avoid trans fats, simple  carbohydrates and processed foods, consider a krill or fish or flaxseed oil cap daily. Tolerating Atorvastatin  Orders: -     Lipid panel  Renal insufficiency Assessment & Plan: Hydrate and monitor   Type 2 diabetes mellitus with other circulatory complication, without long-term current use of insulin (HCC) Assessment & Plan: hgba1c acceptable, minimize simple carbs. Increase exercise as tolerated. Continue current meds    Preventative health care Assessment & Plan: Patient encouraged to maintain heart healthy diet, regular exercise, adequate sleep. Consider daily probiotics. Take medications as prescribed. Labs ordered and reviewed. Given and reviewed copy of ACP documents from [facility] Secretary of [place] and encouraged to complete and return  Colonoscopy 2019 repeat in 2029 Pap Mgm May 2023 Dexa 2019 repeat this year Consider Prevnar 20. RSV (respiratory syncitial virus) vaccine at pharmacy Covid booster in fall High dose flu shot in fall Tetanus due 2025 or if injury occurs.  Shingrix is the new shingles shot, 2 shots over 2-6 months, confirm coverage with insurance and document, then can return here for shots with nurse appt or at pharmacy   Allergic rhinitis, unspecified seasonality, unspecified trigger Assessment & Plan: Consider Zyrtec, Flonase, Astelin and/or Singulair if worsens.      Assessment and Plan    Type 2 Diabetes Mellitus: Patient reports slightly elevated fasting blood glucose levels since discontinuation of Jardiance due to insurance coverage. Currently managed with Metformin. Last A1c in November was 7.4. -Continue Metformin. -If fasting blood glucose consistently >150, consider contacting primary care provider or endocrinologist for addition of another generic medication.  Allergic Rhinitis: Chronic symptoms of itchy throat and eyes, occasional sneezing, and mild nasal congestion. Current management with Zyrtec prn and saline nasal spray provides limited  relief. -Consider trial of Flonase (nasal steroid) for additional symptom control. -Consider trial of Hydroxyzine (antihistamine) at night if itching persists despite other measures.  Gastroparesis: Reports of slow transit time and occasional constipation, but no nausea or vomiting. -Continue current management.  Pruritus: Reports of generalized itching, particularly at night, leading to scratching and skin damage. -Trial  of [facility] for symptomatic relief. -Consider Hydroxyzine at night if itching persists despite other measures.  Vitamin D Deficiency: Currently managed with 7000 units weekly, spread out over the week. -Continue current regimen.  General Health Maintenance: -Scheduled for mammogram next week. -Consider Prevnar 20 vaccination (pneumonia) as previous vaccinations were before age 70. -Consider vaccination for RSV. -Consider Shingles vaccination. -Continue regular dental visits. -Order thyroid ultrasound due to diffusely enlarged thyroid on examination. -Follow-up in 6 months.         Danise Edge, MD

## 2023-01-31 NOTE — Assessment & Plan Note (Signed)
Consider Zyrtec, Flonase, Astelin and/or Singulair if worsens.

## 2023-02-04 ENCOUNTER — Ambulatory Visit (HOSPITAL_BASED_OUTPATIENT_CLINIC_OR_DEPARTMENT_OTHER)
Admission: RE | Admit: 2023-02-04 | Discharge: 2023-02-04 | Disposition: A | Discharge status: Home / Self Care | Payer: PPO | Source: Ambulatory Visit | Attending: Family Medicine | Admitting: Family Medicine

## 2023-02-04 ENCOUNTER — Encounter (HOSPITAL_BASED_OUTPATIENT_CLINIC_OR_DEPARTMENT_OTHER): Payer: Self-pay

## 2023-02-04 DIAGNOSIS — Z1231 Encounter for screening mammogram for malignant neoplasm of breast: Secondary | ICD-10-CM | POA: Insufficient documentation

## 2023-03-02 ENCOUNTER — Other Ambulatory Visit: Payer: Self-pay | Admitting: Family Medicine

## 2023-04-17 ENCOUNTER — Other Ambulatory Visit: Payer: Self-pay | Admitting: Family Medicine

## 2023-05-31 ENCOUNTER — Other Ambulatory Visit: Payer: Self-pay | Admitting: Family Medicine

## 2023-06-03 ENCOUNTER — Ambulatory Visit (INDEPENDENT_AMBULATORY_CARE_PROVIDER_SITE_OTHER): Payer: PPO | Admitting: Podiatry

## 2023-06-03 ENCOUNTER — Encounter: Payer: Self-pay | Admitting: Podiatry

## 2023-06-03 DIAGNOSIS — L600 Ingrowing nail: Secondary | ICD-10-CM

## 2023-06-03 DIAGNOSIS — M79609 Pain in unspecified limb: Secondary | ICD-10-CM | POA: Diagnosis not present

## 2023-06-03 DIAGNOSIS — M7671 Peroneal tendinitis, right leg: Secondary | ICD-10-CM | POA: Diagnosis not present

## 2023-06-03 DIAGNOSIS — B351 Tinea unguium: Secondary | ICD-10-CM | POA: Diagnosis not present

## 2023-06-03 DIAGNOSIS — E1159 Type 2 diabetes mellitus with other circulatory complications: Secondary | ICD-10-CM | POA: Diagnosis not present

## 2023-06-03 NOTE — Progress Notes (Signed)
Subjective:   Patient ID: Lori Zamora, female   DOB: 67 y.o.   MRN: [id number]   HPI Patient presents stating she is long-term diabetic working at getting it under good control with A1c about 7.5 with nail disease that she has to work on herself and she is nervous about doing this inflammation outside of the right foot and history of ingrown toenail.  Patient does not smoke likes to be active   Review of Systems  All other systems reviewed and are negative.       Objective:  Physical Exam Vitals and nursing note reviewed.  Constitutional:      Appearance: She is well-developed.  Pulmonary:     Effort: Pulmonary effort is normal.  Musculoskeletal:        General: Normal range of motion.  Skin:    General: Skin is warm.  Neurological:     Mental Status: She is alert.     Neurological in tact slight diminishment vascular DP PT but relatively intact with nail disease 1 through 5 both feet with incurvation of the beds and slight irritation and thickness.  Also noted to have periodic inflammation of the outside of the right foot base of fifth metatarsal and history of ingrown toenail she works on     Assessment:  Several different problems with low-grade mycotic infection history of pain ingrown toenails and moderate peroneal tendinitis right     Plan:  8 MP all conditions reviewed and for the right we may need to do steroid injection but I want her to continue to wear support shoes and as long as it is periodic we will just watch it.  I did debride nailbeds 1-5 both feet no iatrogenic bleeding discussed ingrown toenails and may require some correction of ingrown toenails if symptoms get worse and I encouraged her to try to get her A1c closer to 6

## 2023-06-11 DIAGNOSIS — K3184 Gastroparesis: Secondary | ICD-10-CM | POA: Diagnosis not present

## 2023-06-11 DIAGNOSIS — E042 Nontoxic multinodular goiter: Secondary | ICD-10-CM | POA: Diagnosis not present

## 2023-06-11 DIAGNOSIS — E119 Type 2 diabetes mellitus without complications: Secondary | ICD-10-CM | POA: Diagnosis not present

## 2023-06-11 LAB — HEMOGLOBIN A1C: Hemoglobin A1C: 6.9

## 2023-07-13 ENCOUNTER — Other Ambulatory Visit: Payer: Self-pay | Admitting: Family Medicine

## 2023-07-16 DIAGNOSIS — E119 Type 2 diabetes mellitus without complications: Secondary | ICD-10-CM | POA: Diagnosis not present

## 2023-07-16 DIAGNOSIS — H2513 Age-related nuclear cataract, bilateral: Secondary | ICD-10-CM | POA: Diagnosis not present

## 2023-07-16 DIAGNOSIS — H40013 Open angle with borderline findings, low risk, bilateral: Secondary | ICD-10-CM | POA: Diagnosis not present

## 2023-07-16 DIAGNOSIS — H5203 Hypermetropia, bilateral: Secondary | ICD-10-CM | POA: Diagnosis not present

## 2023-07-16 LAB — HM DIABETES EYE EXAM

## 2023-08-03 ENCOUNTER — Other Ambulatory Visit: Payer: Self-pay | Admitting: Family Medicine

## 2023-08-05 ENCOUNTER — Ambulatory Visit: Payer: PPO | Admitting: Family Medicine

## 2023-08-05 ENCOUNTER — Encounter: Payer: Self-pay | Admitting: Family Medicine

## 2023-08-05 ENCOUNTER — Ambulatory Visit (INDEPENDENT_AMBULATORY_CARE_PROVIDER_SITE_OTHER): Payer: PPO | Admitting: Family Medicine

## 2023-08-05 VITALS — BP 131/59 | HR 84 | Ht 65.0 in | Wt 168.0 lb

## 2023-08-05 DIAGNOSIS — E559 Vitamin D deficiency, unspecified: Secondary | ICD-10-CM | POA: Diagnosis not present

## 2023-08-05 DIAGNOSIS — I10 Essential (primary) hypertension: Secondary | ICD-10-CM | POA: Diagnosis not present

## 2023-08-05 DIAGNOSIS — M25511 Pain in right shoulder: Secondary | ICD-10-CM | POA: Diagnosis not present

## 2023-08-05 DIAGNOSIS — M359 Systemic involvement of connective tissue, unspecified: Secondary | ICD-10-CM

## 2023-08-05 DIAGNOSIS — G8929 Other chronic pain: Secondary | ICD-10-CM | POA: Diagnosis not present

## 2023-08-05 DIAGNOSIS — Z7984 Long term (current) use of oral hypoglycemic drugs: Secondary | ICD-10-CM

## 2023-08-05 DIAGNOSIS — N289 Disorder of kidney and ureter, unspecified: Secondary | ICD-10-CM | POA: Diagnosis not present

## 2023-08-05 DIAGNOSIS — E1159 Type 2 diabetes mellitus with other circulatory complications: Secondary | ICD-10-CM

## 2023-08-05 DIAGNOSIS — E782 Mixed hyperlipidemia: Secondary | ICD-10-CM | POA: Diagnosis not present

## 2023-08-05 LAB — MICROALBUMIN / CREATININE URINE RATIO
Creatinine,U: 132.1 mg/dL
Microalb Creat Ratio: 1.4 mg/g (ref 0.0–30.0)
Microalb, Ur: 1.8 mg/dL (ref 0.0–1.9)

## 2023-08-05 LAB — CBC WITH DIFFERENTIAL/PLATELET
Basophils Absolute: 0 10*3/uL (ref 0.0–0.1)
Basophils Relative: 0.5 % (ref 0.0–3.0)
Eosinophils Absolute: 0.1 10*3/uL (ref 0.0–0.7)
Eosinophils Relative: 1.5 % (ref 0.0–5.0)
HCT: 40.9 % (ref 36.0–46.0)
Hemoglobin: 12.5 g/dL (ref 12.0–15.0)
Lymphocytes Relative: 31.8 % (ref 12.0–46.0)
Lymphs Abs: 2.1 10*3/uL (ref 0.7–4.0)
MCHC: 30.6 g/dL (ref 30.0–36.0)
MCV: 79.5 fL (ref 78.0–100.0)
Monocytes Absolute: 0.4 10*3/uL (ref 0.1–1.0)
Monocytes Relative: 5.5 % (ref 3.0–12.0)
Neutro Abs: 4 10*3/uL (ref 1.4–7.7)
Neutrophils Relative %: 60.7 % (ref 43.0–77.0)
Platelets: 491 10*3/uL — ABNORMAL HIGH (ref 150.0–400.0)
RBC: 5.14 Mil/uL — ABNORMAL HIGH (ref 3.87–5.11)
RDW: 17.7 % — ABNORMAL HIGH (ref 11.5–15.5)
WBC: 6.6 10*3/uL (ref 4.0–10.5)

## 2023-08-05 LAB — LIPID PANEL
Cholesterol: 146 mg/dL (ref 0–200)
HDL: 43 mg/dL (ref >39.00)
LDL Cholesterol: 84 mg/dL (ref 0–99)
NonHDL: 102.87
Total CHOL/HDL Ratio: 3
Triglycerides: 93 mg/dL (ref 0.0–149.0)
VLDL: 18.6 mg/dL (ref 0.0–40.0)

## 2023-08-05 LAB — COMPREHENSIVE METABOLIC PANEL
ALT: 11 U/L (ref 0–35)
AST: 11 U/L (ref 0–37)
Albumin: 4.3 g/dL (ref 3.5–5.2)
Alkaline Phosphatase: 116 U/L (ref 39–117)
BUN: 11 mg/dL (ref 6–23)
CO2: 28 meq/L (ref 19–32)
Calcium: 8.8 mg/dL (ref 8.4–10.5)
Chloride: 104 meq/L (ref 96–112)
Creatinine, Ser: 0.68 mg/dL (ref 0.40–1.20)
GFR: 90.69 mL/min (ref >60.00)
Glucose, Bld: 109 mg/dL — ABNORMAL HIGH (ref 70–99)
Potassium: 4.3 meq/L (ref 3.5–5.1)
Sodium: 143 meq/L (ref 135–145)
Total Bilirubin: 0.3 mg/dL (ref 0.2–1.2)
Total Protein: 6.9 g/dL (ref 6.0–8.3)

## 2023-08-05 LAB — VITAMIN D 25 HYDROXY (VIT D DEFICIENCY, FRACTURES): VITD: 74.11 ng/mL (ref 30.00–100.00)

## 2023-08-05 LAB — TSH: TSH: 1.17 u[IU]/mL (ref 0.35–5.50)

## 2023-08-05 LAB — SEDIMENTATION RATE: Sed Rate: 19 mm/h (ref 0–30)

## 2023-08-05 NOTE — Assessment & Plan Note (Signed)
Reports of recent slightly elevated blood pressure readings, currently on Amlodipine 5mg  twice daily and Losartan 25 mg daily. -Monitor blood pressure.

## 2023-08-05 NOTE — Assessment & Plan Note (Signed)
History of severe deficiency, then excess with supplementation. Currently taking variable doses (7000-15000 units/week). -Check Vitamin D level today to assess current status.

## 2023-08-05 NOTE — Assessment & Plan Note (Signed)
Labs today. Stay hydrated

## 2023-08-05 NOTE — Assessment & Plan Note (Signed)
Current symptoms include flare of left shoulder pain and fatigue. -Check Sed rate today to assess for inflammation per patient request -Consider referral to physical therapy for shoulder pain and limited range of motion. -Consider referral back to rheumatology if symptoms persist or worsen.

## 2023-08-05 NOTE — Assessment & Plan Note (Signed)
On Atorvastatin 60mg  daily (three 20mg  tablets). -Continue current regimen and healthy lifestyle choices.

## 2023-08-05 NOTE — Progress Notes (Signed)
Established Patient Office Visit  Subjective   Patient ID: Lori Zamora, female    DOB: 25-Aug-1955  Age: 67 y.o. MRN: [id number]  Chief Complaint  Patient presents with   Medical Management of Chronic Issues   Diabetes    HPI   Discussed the use of AI scribe software for clinical note transcription with the patient, who gave verbal consent to proceed.  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 They report a recurrence of symptoms that were prominent during the initial stages of their illness. Specifically, they are experiencing limited range of motion and discomfort in their left shoulder, similar to a previous episode that required physical therapy. The patient has been managing this discomfort by performing exercises learned during previous therapy sessions.  The patient also reports a history of severe vitamin D deficiency, which was initially managed with medication. However, due to concerns about over-supplementation, the patient has adjusted their vitamin D intake to approximately 15,000 units per week. They request a check of their vitamin D levels to ensure they are within the appropriate range.  The patient has noticed a recent mild increase in their blood pressure, which has been stable for a significant period. They suspect this change may be related to their lupus. Despite these concerns, the patient reports good control of their blood sugar levels, with a recent HbA1c of 6.9%. They manage their diabetes with metformin and Jardiance.  The patient also mentions a history of gastroparesis and an elevated liver function test, both of which have been stable and not requiring active management. They have been managing their lupus without specific medication, finding rest to be the most beneficial. However, they have noticed an increase in  fatigue and a decrease in their ability to exercise without feeling unwell. The patient attributes these changes to a potential lupus flare-up and requests a sedimentation rate test to assess for inflammation.           ROS All review of systems negative except what is listed in the HPI    Objective:     BP (!) 131/59   Pulse 84   Ht 5\' 5"  (1.651 m)   Wt 168 lb (76.2 kg)   SpO2 100%   BMI 27.96 kg/m    Physical Exam Vitals reviewed.  Constitutional:      Appearance: Normal appearance.  Cardiovascular:     Rate and Rhythm: Normal rate and regular rhythm.     Heart sounds: Normal heart sounds.  Pulmonary:     Effort: Pulmonary effort is normal.     Breath sounds: Normal breath sounds.  Musculoskeletal:        General: No swelling or tenderness.     Comments: L shoulder: discomfort with Hawkins/Neers, Apleys  Skin:    General: Skin is warm and dry.  Neurological:     Mental Status: She is alert and oriented to person, place, and time.  Psychiatric:        Mood and Affect: Mood normal.        Behavior: Behavior normal.        Thought Content: Thought content normal.        Judgment: Judgment normal.      Results for orders placed or performed in visit on 08/05/23  Hemoglobin A1c  Result Value Ref Range   Hemoglobin A1C 6.9  The 10-year ASCVD risk score (Arnett DK, et al., 2019) is: 18.3%    Assessment & Plan:   Problem List Items Addressed This Visit       Active Problems   Vitamin D deficiency   History of severe deficiency, then excess with supplementation. Currently taking variable doses (7000-15000 units/week). -Check Vitamin D level today to assess current status.      Relevant Orders   VITAMIN D 25 Hydroxy (Vit-D Deficiency, Fractures)   Hyperlipidemia, mixed   On Atorvastatin 60mg  daily (three 20mg  tablets). -Continue current regimen and healthy lifestyle choices.      Relevant Orders   Lipid panel   Essential hypertension    Reports of recent slightly elevated blood pressure readings, currently on Amlodipine 5mg  twice daily and Losartan 25 mg daily. -Monitor blood pressure.      Relevant Orders   TSH   Shoulder pain   Reports of shoulder pain and limited range of motion, similar to previous lupus flare. No current rheumatology follow-up or medication for lupus. -Check Sed rate today to assess for inflammation per patient request -Consider referral to physical therapy for shoulder pain and limited range of motion. -Consider referral back to rheumatology if symptoms persist or worsen.      Diffuse connective tissue disease (HCC)   Current symptoms include flare of left shoulder pain and fatigue. -Check Sed rate today to assess for inflammation per patient request -Consider referral to physical therapy for shoulder pain and limited range of motion. -Consider referral back to rheumatology if symptoms persist or worsen.      Relevant Orders   Sedimentation rate   Renal insufficiency   Labs today. Stay hydrated      Relevant Orders   Comprehensive metabolic panel   Diabetes mellitus (HCC) - Primary   Reports good blood sugar control, last HbA1c 6.9 in October. Currently on Metformin 1000mg  twice daily and Jardiance 10mg  daily. -Continue current regimen and lifestyle measures      Relevant Medications   JARDIANCE 10 MG TABS tablet   Other Relevant Orders   Microalbumin / creatinine urine ratio   CBC with Differential/Platelet   Comprehensive metabolic panel    Return in about 6 months (around 02/03/2024) for routine follow-up; schedule AWV.    Clayborne Dana, NP

## 2023-08-05 NOTE — Assessment & Plan Note (Signed)
Reports of shoulder pain and limited range of motion, similar to previous lupus flare. No current rheumatology follow-up or medication for lupus. -Check Sed rate today to assess for inflammation per patient request -Consider referral to physical therapy for shoulder pain and limited range of motion. -Consider referral back to rheumatology if symptoms persist or worsen.

## 2023-08-05 NOTE — Assessment & Plan Note (Signed)
Reports good blood sugar control, last HbA1c 6.9 in October. Currently on Metformin 1000mg  twice daily and Jardiance 10mg  daily. -Continue current regimen and lifestyle measures

## 2023-09-04 NOTE — Telephone Encounter (Signed)
Patient is needing a call back regarding her cancel appt she wants to know why the appt  was canceled I treid to reschedule the patient but she is wanting a call back regarding this matter

## 2023-10-07 ENCOUNTER — Other Ambulatory Visit: Payer: Self-pay | Admitting: Family Medicine

## 2023-10-16 ENCOUNTER — Telehealth: Payer: Self-pay | Admitting: Family Medicine

## 2023-10-16 NOTE — Telephone Encounter (Signed)
 Copied from CRM [id number]. Topic: Medicare AWV >> Oct 16, 2023 10:30 AM Payton Doughty wrote: Reason for CRM: Called LVM 10/16/2023 to schedule AWV. Please schedule Virtual or Telehealth visits ONLY.   Verlee Rossetti; Care Guide Ambulatory Clinical Support [facility] Direct Dial: [phone]

## 2023-12-10 DIAGNOSIS — E119 Type 2 diabetes mellitus without complications: Secondary | ICD-10-CM | POA: Diagnosis not present

## 2023-12-10 DIAGNOSIS — M359 Systemic involvement of connective tissue, unspecified: Secondary | ICD-10-CM | POA: Diagnosis not present

## 2023-12-10 DIAGNOSIS — K3184 Gastroparesis: Secondary | ICD-10-CM | POA: Diagnosis not present

## 2023-12-10 DIAGNOSIS — E042 Nontoxic multinodular goiter: Secondary | ICD-10-CM | POA: Diagnosis not present

## 2023-12-10 LAB — HM DIABETES FOOT EXAM: HM Diabetic Foot Exam: NORMAL

## 2023-12-10 LAB — HEMOGLOBIN A1C: Hemoglobin A1C: 6.8

## 2024-01-18 ENCOUNTER — Other Ambulatory Visit: Payer: Self-pay | Admitting: Family Medicine

## 2024-01-28 ENCOUNTER — Other Ambulatory Visit: Payer: Self-pay | Admitting: Family Medicine

## 2024-01-30 ENCOUNTER — Other Ambulatory Visit (HOSPITAL_BASED_OUTPATIENT_CLINIC_OR_DEPARTMENT_OTHER): Payer: Self-pay | Admitting: Family Medicine

## 2024-01-30 DIAGNOSIS — Z1231 Encounter for screening mammogram for malignant neoplasm of breast: Secondary | ICD-10-CM

## 2024-02-04 ENCOUNTER — Encounter: Payer: PPO | Admitting: Family Medicine

## 2024-02-04 ENCOUNTER — Encounter: Payer: PPO | Admitting: Physician Assistant

## 2024-02-04 DIAGNOSIS — Z780 Asymptomatic menopausal state: Secondary | ICD-10-CM | POA: Insufficient documentation

## 2024-02-04 NOTE — Assessment & Plan Note (Signed)
 Patient encouraged to maintain heart healthy diet, regular exercise, adequate sleep. Consider daily probiotics. Take medications as prescribed. Labs ordered and reviewed. Given and reviewed copy of ACP documents from [facility] Secretary of State and encouraged to complete and return    General HCM: -Scheduled for mammogram July 2025 -Colonoscopy 2019 repeat in 2029 -Pap- Denies. -Dexa 2019 repeat this year -Immunizations: Consider vaccination for RSV, PNA due, Covid-19- Pt to get RSV at pharmacy, Denies ---------------------Consider Prevnar 20 vaccination (pneumonia) as previous vaccinations were before age 57. -Continue regular dental visits.

## 2024-02-04 NOTE — Progress Notes (Unsigned)
 Subjective:     Patient ID: Lori Zamora, female    DOB: [birth year], 68 y.o.   MRN: [id number]  No chief complaint on file.   HPI    Lori Zamora a 68 y.o.  female/female***presents today for a complete physical exam. Pt reports consuming a {diet types:17450} diet. {types:19826} Pt generally feels {DESC; WELL/FAIRLY WELL/POORLY:18703}. Reports sleeping {DESC; WELL/FAIRLY WELL/POORLY:18703}.  {does/does not:200015} have additional problems to discuss today.   New Surgeries or Family Hx: ***Yes/ Denies Habits: ETOH, illicit drugs, smoking, secondhand exposure, caffeine  Hypertension Amlodipine  5 mg daily  Vitamin D  deficiency Vitamin D  2000 units daily  DM type II- followed by Endocrinology Metformin 500 mg twice daily Jardiance 10 mg daily  Patient taking medications as prescribed.  HCM Mgm: Last mammogram negative. Mgm scheduled 02/11/2024. Pap: Colonoscopy: Last 2019, Repeat 2029. Immunization: PNA, Tdap, and Covid-19 Due   Patient denies fever, chills, SOB, CP, palpitations, dyspnea, edema, HA, vision changes, N/V/D, abdominal pain, urinary symptoms, polydipsia, polyphagia, polyuria, rash, weight changes, and recent illness or hospitalizations.   History of Present Illness              Health Maintenance Due  Topic Date Due   Medicare Annual Wellness (AWV)  Never done   Pneumococcal Vaccine: 50+ Years (3 of 3 - PCV20 or PCV21) 07/27/2018   COVID-19 Vaccine (1 - 2024-25 season) Never done   DTaP/Tdap/Td (3 - Td or Tdap) 11/27/2023    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   Bipolar 2 disorder (HCC) 01/31/2010   Qualifier: Diagnosis of  By: Dee CMA (AAMA), [place]  Follows with Dr Elodie Anon of psychiatry    Bipolar disorder, unspecified (HCC)    CFS (chronic fatigue syndrome)    Degeneration of  intervertebral disc, site unspecified    Depressive disorder, not elsewhere classified    Esophageal reflux    FH: breast cancer 05/20/2017   Gastroparesis 06/07/2015   Hyperglycemia 01/20/2018   Intertriginous candidiasis 11/29/2013   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   Microcytic anemia 01/31/2010   Qualifier: Diagnosis of  By: Dee CMA (AAMA), Lugene     Migraine with aura, without mention of intractable migraine without mention of status migrainosus    Myalgia and myositis, unspecified    Nontoxic multinodular goiter    Other and unspecified hyperlipidemia    Other malaise and fatigue    Other specified iron deficiency anemias    Other thalassemia (HCC)    Parotid gland enlargement 2011   related to connective tissue syndrome   Pelvic floor dysfunction 01/27/2013   Peripheral edema 02/19/2017   Preventative health care 09/12/2014   Renal insufficiency 01/27/2013   Sjogren's syndrome (HCC)    Small intestinal bacterial overgrowth 11/29/2013   Systemic lupus erythematosus (HCC)    Thrombocytosis 06/07/2016   Type II or unspecified type diabetes mellitus without mention of complication, not stated as uncontrolled    Unspecified constipation    Unspecified diffuse connective tissue disease 01/27/2013   Follow[facility] at [facility] rheumatology, Dr Sheran Per patient has tested 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 antibody   Unspecified essential hypertension    Unspecified sleep apnea    Unspecified vitamin D  deficiency     Past Surgical History:  Procedure Laterality Date   BIOPSY THYROID      01/13/2015,  3 previous biopsy reported all benign   LAPAROSCOPIC TOTAL HYSTERECTOMY  2004    Family History  Problem Relation Age of Onset   Diabetes Mother    Heart disease Mother        family history   Allergies Mother    Hypertension Mother    Hypertension Father    Other Father        viral meningitis   Stroke Sister        4 in 2019    Hypertension Sister    Brain cancer Sister        glioblastoma   Cancer Sister        renal cancer   Cancer Sister 74       breast   Breast cancer Sister 70   Alcohol abuse Brother        drug abuse   Hypertension Brother    Heart disease Maternal Grandmother    Heart disease Maternal Grandfather    Alcohol abuse Other        Family history of alcoholism and addiction   Hypertension Other        family history   Kidney disease Other        family history   Stroke Other        1st degree relative <60    Social History   Socioeconomic History   Marital status: Single    Spouse name: Not on file   Number of children: 0   Years of education: Not on file   Highest education level: Professional school degree (e.g., MD, DDS, DVM, JD)  Occupational History   Occupation: Teacher  Tobacco Use   Smoking status: Never   Smokeless tobacco: Never  Vaping Use   Vaping status: Not on file  Substance and Sexual Activity   Alcohol use: No   Drug use: No   Sexual activity: Never    Birth control/protection: None    Comment: lives alone, no major dietary restrictions, retired from teaching kindergarten  Other Topics Concern   Not on file  Social History Narrative   Former Runner, broadcasting/film/video, on disability   Psychologist, 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[employer])   Social Drivers of Health   Financial Resource Strain: Low Risk  (02/04/2024)   Overall Financial Resource Strain (CARDIA)    Difficulty of Paying Living Expenses: Not hard at all  Food Insecurity: No Food Insecurity (02/04/2024)   Hunger Vital Sign    Worried About Running Out of Food in the Last Year: Never true    Ran Out of Food in the Last Year: Never true  Transportation Needs: No Transportation Needs (02/04/2024)   PRAPARE - Administrator, Civil Service (Medical): No    Lack of Transportation (Non-Medical): No  Physical Activity: Inactive (02/04/2024)   Exercise Vital Sign    Days of Exercise per Week: 0 days    Minutes of Exercise per  Session: Not on file  Stress: No Stress Concern Present (02/04/2024)   [employer] of Occupational Health - Occupational Stress Questionnaire    Feeling of Stress: Not at all  Social Connections: Unknown (02/04/2024)   Social Connection and Isolation Panel    Frequency of Communication with Friends and Family: Patient declined    Frequency of Social Gatherings with Friends and Family: Patient declined    Attends Religious Services: Patient declined    Database administrator or Organizations: Patient declined    Attends Banker Meetings: Not on file  Marital Status: Never married  Catering manager Violence: Not on file    Outpatient Medications Prior to Visit  Medication Sig Dispense Refill   amLODipine  (NORVASC ) 5 MG tablet Take 1 tablet (5 mg total) by mouth in the morning and at bedtime. 180 tablet 1   Ascorbic Acid (VITAMIN C ) 1000 MG tablet Take 1 tablet (1,000 mg total) by mouth daily.     aspirin  EC 81 MG tablet Take 1 tablet (81 mg total) by mouth daily.     atorvastatin  (LIPITOR) 20 MG tablet Take 3 tablets (60 mg total) by mouth daily. 270 tablet 0   cholecalciferol (VITAMIN D ) 1000 UNITS tablet Take 2,000 Units by mouth daily.     diphenhydrAMINE HCl, Sleep, 50 MG tablet Take by mouth.     folic acid  (FOLVITE ) 1 MG tablet TAKE 1 TABLET(1 MG) BY MOUTH DAILY 90 tablet 3   JARDIANCE 10 MG TABS tablet Take 10 mg by mouth every morning.     ketoconazole  (NIZORAL ) 2 % cream Apply 1 Application topically daily. 30 g 1   Lidocaine, Anorectal, 5 % CREA Apply topically as needed.     losartan  (COZAAR ) 25 MG tablet Take 1 tablet by mouth once daily 90 tablet 0   Magnesium Oxide 500 MG TABS Take by mouth.     metFORMIN (GLUCOPHAGE) 500 MG tablet Take 1,000 mg by mouth 2 (two) times daily with a meal.     Mouthwashes (BIOTENE/CALCIUM  PBF) LIQD by Transmucosal route.     NON FORMULARY Lubricant eye ointment- sterile mineral oil 39.9% White Petroleum 57.7%     NON  FORMULARY Biotene dry mouth oral rinse     omeprazole  (PRILOSEC) 20 MG capsule Take 1 capsule (20 mg total) by mouth daily. 90 capsule 1   ONETOUCH VERIO test strip CHECK BLOOD SUGAR IN VITRO TWICE DAILY 200 strip 2   Polyethyl Glycol-Propyl Glycol (SYSTANE) 0.4-0.3 % GEL ophthalmic gel Apply to eye.     PRESCRIPTION MEDICATION prevident 5000- dry mouth toothpaste     Probiotic Product (DIGESTIVE ADVANTAGE GUMMIES PO) Take by mouth 2 (two) times daily.     RA SUNSCREEN SPF50 EX Apply topically as needed.     zinc  gluconate 50 MG tablet Take 50 mg by mouth daily.     No facility-administered medications prior to visit.    Allergies  Allergen Reactions   Penicillins Swelling and Anaphylaxis    Swelling of tongue.   Sulfa Antibiotics Other (See Comments) and Rash    Other Reaction: swellin of tongue Other Reaction: swellin of tongue   Sulfonamide Derivatives Swelling    REACTION: Swelling of tongue and face   Acetazolamide    Azathioprine Other (See Comments)    Very low TPMT in deficient range Unknown   Ezetimibe Other (See Comments)    Unknown   Lisinopril Other (See Comments)    Unknown   Meloxicam Other (See Comments)    Unknown   Pravastatin Sodium Other (See Comments)    Unknown   Simvastatin Other (See Comments)    Unknown   Latex Hives and Rash    ROS     Objective:    Physical Exam   There were no vitals taken for this visit. Wt Readings from Last 3 Encounters:  08/05/23 168 lb (76.2 kg)  01/31/23 172 lb 12.8 oz (78.4 kg)  10/01/22 172 lb (78 kg)       Assessment & Plan:   Problem List Items Addressed This Visit  None   Assessment and Plan  Multiple autoimmune issues: Lupus, Sjogren's syndrome   Reports of shoulder pain and limited range of motion, similar to previous lupus flare. No current rheumatology follow-up or medication for lupus. -Check Sed rate today to assess for inflammation per patient request -Consider referral to physical therapy  for shoulder pain and limited range of motion. -Consider referral back to rheumatology if symptoms persist or worsen.  IDA Anemia Hx and alpha thalassemia.   Fibromyalgia/Chronic Pain    Type 2 Diabetes Mellitus:  Patient reports slightly elevated fasting blood glucose levels since discontinuation of Jardiance due to insurance coverage. Currently managed with Metformin. Last A1c in November was 7.4.     Allergic Rhinitis: Chronic symptoms of itchy throat and eyes, occasional sneezing, and mild nasal congestion. Current management with Zyrtec prn and saline nasal spray provides limited relief. -Consider trial of Flonase (nasal steroid) for additional symptom control. -Consider trial of Hydroxyzine (antihistamine) at night if itching persists despite other measures.   Gastroparesis: Reports of slow transit time and occasional constipation, but no nausea or vomiting. -Continue current management.   Pruritus: Reports of generalized itching, particularly at night, leading to scratching and skin damage. -Trial of Witch Hazel for symptomatic relief. -Consider Hydroxyzine at night if itching persists despite other measures.   Vitamin D  Deficiency: Currently managed with 7000 units weekly, spread out over the week. -Continue current regimen.        I am having Lori Zamora maintain her metFORMIN, cholecalciferol, RA SUNSCREEN SPF50 EX, Lidocaine (Anorectal), PRESCRIPTION MEDICATION, NON FORMULARY, NON FORMULARY, Polyethyl Glycol-Propyl Glycol, Probiotic Product (DIGESTIVE ADVANTAGE GUMMIES PO), diphenhydrAMINE HCl (Sleep), Magnesium Oxide -Mg Supplement, Biotene/Calcium  PBF, vitamin C , aspirin  EC, zinc  gluconate, OneTouch Verio, folic acid , ketoconazole , amLODipine , omeprazole , Jardiance, atorvastatin , and losartan .  No orders of the defined types were placed in this encounter.

## 2024-02-05 ENCOUNTER — Ambulatory Visit: Payer: Self-pay | Admitting: Student

## 2024-02-05 ENCOUNTER — Ambulatory Visit (INDEPENDENT_AMBULATORY_CARE_PROVIDER_SITE_OTHER): Admitting: Student

## 2024-02-05 ENCOUNTER — Encounter: Payer: Self-pay | Admitting: Student

## 2024-02-05 VITALS — BP 116/72 | HR 76 | Temp 98.3°F | Resp 16 | Ht 65.0 in | Wt 163.5 lb

## 2024-02-05 DIAGNOSIS — E1159 Type 2 diabetes mellitus with other circulatory complications: Secondary | ICD-10-CM | POA: Diagnosis not present

## 2024-02-05 DIAGNOSIS — Z23 Encounter for immunization: Secondary | ICD-10-CM

## 2024-02-05 DIAGNOSIS — Z0000 Encounter for general adult medical examination without abnormal findings: Secondary | ICD-10-CM

## 2024-02-05 DIAGNOSIS — R700 Elevated erythrocyte sedimentation rate: Secondary | ICD-10-CM | POA: Diagnosis not present

## 2024-02-05 DIAGNOSIS — Z7984 Long term (current) use of oral hypoglycemic drugs: Secondary | ICD-10-CM

## 2024-02-05 DIAGNOSIS — Z780 Asymptomatic menopausal state: Secondary | ICD-10-CM

## 2024-02-05 DIAGNOSIS — N289 Disorder of kidney and ureter, unspecified: Secondary | ICD-10-CM | POA: Diagnosis not present

## 2024-02-05 DIAGNOSIS — E782 Mixed hyperlipidemia: Secondary | ICD-10-CM | POA: Diagnosis not present

## 2024-02-05 DIAGNOSIS — I10 Essential (primary) hypertension: Secondary | ICD-10-CM

## 2024-02-05 DIAGNOSIS — E559 Vitamin D deficiency, unspecified: Secondary | ICD-10-CM | POA: Diagnosis not present

## 2024-02-05 DIAGNOSIS — M810 Age-related osteoporosis without current pathological fracture: Secondary | ICD-10-CM

## 2024-02-05 LAB — COMPREHENSIVE METABOLIC PANEL WITH GFR
ALT: 17 U/L (ref 0–35)
AST: 15 U/L (ref 0–37)
Albumin: 4.4 g/dL (ref 3.5–5.2)
Alkaline Phosphatase: 107 U/L (ref 39–117)
BUN: 12 mg/dL (ref 6–23)
CO2: 30 meq/L (ref 19–32)
Calcium: 9.6 mg/dL (ref 8.4–10.5)
Chloride: 102 meq/L (ref 96–112)
Creatinine, Ser: 0.73 mg/dL (ref 0.40–1.20)
GFR: 85.34 mL/min (ref >60.00)
Glucose, Bld: 96 mg/dL (ref 70–99)
Potassium: 4.4 meq/L (ref 3.5–5.1)
Sodium: 140 meq/L (ref 135–145)
Total Bilirubin: 0.4 mg/dL (ref 0.2–1.2)
Total Protein: 6.9 g/dL (ref 6.0–8.3)

## 2024-02-05 LAB — CBC WITH DIFFERENTIAL/PLATELET
Basophils Absolute: 0.1 10*3/uL (ref 0.0–0.1)
Basophils Relative: 0.8 % (ref 0.0–3.0)
Eosinophils Absolute: 0.1 10*3/uL (ref 0.0–0.7)
Eosinophils Relative: 0.8 % (ref 0.0–5.0)
HCT: 39.5 % (ref 36.0–46.0)
Hemoglobin: 12.4 g/dL (ref 12.0–15.0)
Lymphocytes Relative: 30.1 % (ref 12.0–46.0)
Lymphs Abs: 2.2 10*3/uL (ref 0.7–4.0)
MCHC: 31.4 g/dL (ref 30.0–36.0)
MCV: 78.1 fl (ref 78.0–100.0)
Monocytes Absolute: 0.5 10*3/uL (ref 0.1–1.0)
Monocytes Relative: 7.3 % (ref 3.0–12.0)
Neutro Abs: 4.5 10*3/uL (ref 1.4–7.7)
Neutrophils Relative %: 61 % (ref 43.0–77.0)
Platelets: 454 10*3/uL — ABNORMAL HIGH (ref 150.0–400.0)
RBC: 5.06 Mil/uL (ref 3.87–5.11)
RDW: 18.4 % — ABNORMAL HIGH (ref 11.5–15.5)
WBC: 7.3 10*3/uL (ref 4.0–10.5)

## 2024-02-05 LAB — LIPID PANEL
Cholesterol: 161 mg/dL (ref 0–200)
HDL: 48 mg/dL (ref >39.00)
LDL Cholesterol: 94 mg/dL (ref 0–99)
NonHDL: 112.55
Total CHOL/HDL Ratio: 3
Triglycerides: 94 mg/dL (ref 0.0–149.0)
VLDL: 18.8 mg/dL (ref 0.0–40.0)

## 2024-02-05 LAB — SEDIMENTATION RATE: Sed Rate: 17 mm/h (ref 0–30)

## 2024-02-05 LAB — VITAMIN D 25 HYDROXY (VIT D DEFICIENCY, FRACTURES): VITD: 62.91 ng/mL (ref 30.00–100.00)

## 2024-02-05 LAB — TSH: TSH: 1.01 u[IU]/mL (ref 0.35–5.50)

## 2024-02-05 NOTE — Assessment & Plan Note (Signed)
 On Atorvastatin 60mg  daily (three 20mg  tablets). -Continue current regimen and healthy lifestyle choices.

## 2024-02-05 NOTE — Assessment & Plan Note (Signed)
 Last vitamin D  level stable. History of severe deficiency, then excess with supplementation. Currently taking  2000 units daily.Check Vitamin D  level today to assess current status.

## 2024-02-05 NOTE — Assessment & Plan Note (Signed)
 Reports good blood sugar control, last HbA1c 6.6 on April. Currently on Metformin 1000mg  twice daily and Jardiance 10mg  daily. -Continue current regimen and lifestyle measures.

## 2024-02-05 NOTE — Assessment & Plan Note (Signed)
 Well controlled, no changes to meds. Encouraged heart healthy diet such as the DASH diet and exercise as tolerated.

## 2024-02-05 NOTE — Assessment & Plan Note (Signed)
Repeat with labs

## 2024-02-05 NOTE — Assessment & Plan Note (Signed)
 Update labs today.  Ensure adequate hydration.

## 2024-02-07 ENCOUNTER — Telehealth (HOSPITAL_BASED_OUTPATIENT_CLINIC_OR_DEPARTMENT_OTHER): Payer: Self-pay

## 2024-02-11 ENCOUNTER — Ambulatory Visit (HOSPITAL_BASED_OUTPATIENT_CLINIC_OR_DEPARTMENT_OTHER)
Admission: RE | Admit: 2024-02-11 | Discharge: 2024-02-11 | Disposition: A | Discharge status: Home / Self Care | Source: Ambulatory Visit | Attending: Student | Admitting: Student

## 2024-02-11 ENCOUNTER — Encounter (HOSPITAL_BASED_OUTPATIENT_CLINIC_OR_DEPARTMENT_OTHER): Payer: Self-pay

## 2024-02-11 ENCOUNTER — Encounter: Payer: Self-pay | Admitting: Internal Medicine

## 2024-02-11 ENCOUNTER — Ambulatory Visit (HOSPITAL_BASED_OUTPATIENT_CLINIC_OR_DEPARTMENT_OTHER)
Admission: RE | Admit: 2024-02-11 | Discharge: 2024-02-11 | Disposition: A | Discharge status: Home / Self Care | Source: Ambulatory Visit | Attending: Family Medicine | Admitting: Family Medicine

## 2024-02-11 DIAGNOSIS — M810 Age-related osteoporosis without current pathological fracture: Secondary | ICD-10-CM | POA: Diagnosis not present

## 2024-02-11 DIAGNOSIS — Z780 Asymptomatic menopausal state: Secondary | ICD-10-CM | POA: Diagnosis not present

## 2024-02-11 DIAGNOSIS — Z1231 Encounter for screening mammogram for malignant neoplasm of breast: Secondary | ICD-10-CM | POA: Diagnosis not present

## 2024-02-22 ENCOUNTER — Other Ambulatory Visit: Payer: Self-pay | Admitting: Family Medicine

## 2024-02-25 MED ORDER — ALENDRONATE SODIUM 70 MG PO TABS: 70.0000 mg | ORAL_TABLET | ORAL | 11 refills | Status: AC

## 2024-03-21 ENCOUNTER — Other Ambulatory Visit: Payer: Self-pay | Admitting: Family Medicine

## 2024-04-13 NOTE — Progress Notes (Unsigned)
 Subjective:     Patient ID: Lori Zamora, female    DOB: [birth year], 68 y.o.   MRN: [id number]  No chief complaint on file.   HPI  Presents for ear irrigation  Patient denies fever, chills, SOB, CP, palpitations, dyspnea, edema, HA, vision changes, N/V/D, abdominal pain, urinary symptoms, rash, weight changes, and recent illness or hospitalizations.   History of Present Illness              Health Maintenance Due  Topic Date Due   Medicare Annual Wellness (AWV)  Never done   Diabetic kidney evaluation - Urine ACR  09/11/2023   INFLUENZA VACCINE  03/13/2024   COVID-19 Vaccine (1 - 2024-25 season) Never done    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   Bipolar 2 disorder (HCC) 01/31/2010   Qualifier: Diagnosis of  By: Dee CMA (AAMA), Lug[place]  Follows with Dr Elodie Anon of psychiatry    Bipolar disorder, unspecified (HCC)    CFS (chronic fatigue syndrome)    Degeneration of intervertebral disc, site unspecified    Depressive disorder, not elsewhere classified    Esophageal reflux    FH: breast cancer 05/20/2017   Gastroparesis 06/07/2015   Hyperglycemia 01/20/2018   Intertriginous candidiasis 11/29/2013   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   Microcytic anemia 01/31/2010   Qualifier: Diagnosis of  By: Dee CMA (AAMA), Lugene     Migraine with aura, without mention of intractable migraine without mention of status migrainosus    Myalgia and myositis, unspecified    Nontoxic multinodular goiter    Other and unspecified hyperlipidemia    Other malaise and fatigue    Other specified iron deficiency anemias    Other thalassemia (HCC)    Parotid gland enlargement 2011   related to connective tissue syndrome   Pelvic floor dysfunction 01/27/2013   Peripheral edema 02/19/2017   Preventative health care 09/12/2014    Renal insufficiency 01/27/2013   Sjogren's syndrome (HCC)    Small intestinal bacterial overgrowth 11/29/2013   Systemic lupus erythematosus (HCC)    Thrombocytosis 06/07/2016   Type II or unspecified type diabetes mellitus without mention of complication, not stated as uncontrolled    Unspecified constipation    Unspecified diffuse connective tissue disease 01/27/2013   Follows at [facility] rheumatology, Dr Sheran Per patient has tested 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 antibody   Unspecified essential hypertension    Unspecified sleep apnea    Unspecified vitamin D  deficiency     Past Surgical History:  Procedure Laterality Date   BIOPSY THYROID      01/13/2015, 3 previous biopsy reported all benign   LAPAROSCOPIC TOTAL HYSTERECTOMY  2004    Family History  Problem Relation Age of Onset   Diabetes Mother    Heart disease Mother        family history   Allergies Mother    Hypertension Mother    Hypertension Father    Other Father        viral meningitis   Stroke Sister        4 in 2019   Hypertension Sister    Brain cancer Sister        glioblastoma   Cancer Sister        renal cancer   Cancer Sister 42  breast   Breast cancer Sister 39   Alcohol abuse Brother        drug abuse   Hypertension Brother    Heart disease Maternal Grandmother    Heart disease Maternal Grandfather    Alcohol abuse Other        Family history of alcoholism and addiction   Hypertension Other        family history   Kidney disease Other        family history   Stroke Other        1st degree relative <60    Social History   Socioeconomic History   Marital status: Single    Spouse name: Not on file   Number of children: 0   Years of education: Not on file   Highest education level: Professional school degree (e.g., MD, DDS, DVM, JD)  Occupational History   Occupation: Teacher  Tobacco Use   Smoking status: Never   Smokeless  tobacco: Never  Vaping Use   Vaping status: Not on file  Substance and Sexual Activity   Alcohol use: No   Drug use: No   Sexual activity: Never    Birth control/protection: None    Comment: lives alone, no major dietary restrictions, retired from teaching kindergarten  Other Topics Concern   Not on file  Social History Narrative   Former Runner, broadcasting/film/video, on disability   Psychologist, 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[employer])   Social Drivers of Health   Financial Resource Strain: Low Risk  (02/04/2024)   Overall Financial Resource Strain (CARDIA)    Difficulty of Paying Living Expenses: Not hard at all  Food Insecurity: No Food Insecurity (02/04/2024)   Hunger Vital Sign    Worried About Running Out of Food in the Last Year: Never true    Ran Out of Food in the Last Year: Never true  Transportation Needs: No Transportation Needs (02/04/2024)   PRAPARE - Administrator, Civil Service (Medical): No    Lack of Transportation (Non-Medical): No  Physical Activity: Inactive (02/04/2024)   Exercise Vital Sign    Days of Exercise per Week: 0 days    Minutes of Exercise per Session: Not on file  Stress: No Stress Concern Present (02/04/2024)   [employer] of Occupational Health - Occupational Stress Questionnaire    Feeling of Stress: Not at all  Social Connections: Unknown (02/04/2024)   Social Connection and Isolation Panel    Frequency of Communication with Friends and Family: Patient declined    Frequency of Social Gatherings with Friends and Family: Patient declined    Attends Religious Services: Patient declined    Database administrator or Organizations: Patient declined    Attends Engineer, structural: Not on file    Marital Status: Never married  Intimate Partner Violence: Not on file    Outpatient Medications Prior to Visit  Medication Sig Dispense Refill   alendronate  (FOSAMAX ) 70 MG tablet Take 1 tablet (70 mg total) by mouth every 7 (seven) days. Take with a full glass of water on  an empty stomach. 4 tablet 11   amLODipine  (NORVASC ) 5 MG tablet TAKE 1 TABLET BY MOUTH IN THE MORNING AND AT BEDTIME 180 tablet 0   Ascorbic Acid (VITAMIN C ) 1000 MG tablet Take 1 tablet (1,000 mg total) by mouth daily.     aspirin  EC 81 MG tablet Take 1 tablet (81 mg total) by mouth daily.     atorvastatin  (LIPITOR) 20 MG tablet Take  3 tablets (60 mg total) by mouth daily. 270 tablet 0   cholecalciferol (VITAMIN D ) 1000 UNITS tablet Take 2,000 Units by mouth daily.     diphenhydrAMINE HCl, Sleep, 50 MG tablet Take by mouth.     folic acid  (FOLVITE ) 1 MG tablet TAKE 1 TABLET(1 MG) BY MOUTH DAILY 90 tablet 3   JARDIANCE 10 MG TABS tablet Take 10 mg by mouth every morning.     Lidocaine, Anorectal, 5 % CREA Apply topically as needed.     losartan  (COZAAR ) 25 MG tablet Take 1 tablet by mouth once daily 90 tablet 0   Magnesium Oxide 500 MG TABS Take by mouth.     metFORMIN (GLUCOPHAGE) 500 MG tablet Take 1,000 mg by mouth 2 (two) times daily with a meal.     Mouthwashes (BIOTENE/CALCIUM  PBF) LIQD by Transmucosal route.     NON FORMULARY Lubricant eye ointment- sterile mineral oil 39.9% White Petroleum 57.7%     NON FORMULARY Biotene dry mouth oral rinse     omeprazole  (PRILOSEC) 20 MG capsule Take 1 capsule by mouth once daily 90 capsule 3   ONETOUCH VERIO test strip CHECK BLOOD SUGAR IN VITRO TWICE DAILY 200 strip 2   Polyethyl Glycol-Propyl Glycol (SYSTANE) 0.4-0.3 % GEL ophthalmic gel Apply to eye.     PRESCRIPTION MEDICATION prevident 5000- dry mouth toothpaste     Probiotic Product (DIGESTIVE ADVANTAGE GUMMIES PO) Take by mouth 2 (two) times daily.     RA SUNSCREEN SPF50 EX Apply topically as needed.     zinc  gluconate 50 MG tablet Take 50 mg by mouth daily.     No facility-administered medications prior to visit.    Allergies  Allergen Reactions   Penicillins Swelling and Anaphylaxis    Swelling of tongue.   Sulfa Antibiotics Other (See Comments) and Rash    Other Reaction: swellin  of tongue Other Reaction: swellin of tongue   Sulfonamide Derivatives Swelling    REACTION: Swelling of tongue and face   Acetazolamide    Azathioprine Other (See Comments)    Very low TPMT in deficient range Unknown   Ezetimibe Other (See Comments)    Unknown   Lisinopril Other (See Comments)    Unknown   Meloxicam Other (See Comments)    Unknown   Pravastatin Sodium Other (See Comments)    Unknown   Simvastatin Other (See Comments)    Unknown   Latex Hives and Rash    ROS    See HPI Objective:    Physical Exam   General: No acute distress. Awake and conversant.  Eyes: Normal conjunctiva, anicteric. Round symmetric pupils.  ENT: Hearing grossly intact. No nasal discharge.  +Tms**** Respiratory: CTAB. Respirations are non-labored. No wheezing.  Skin: Warm. No rashes or ulcers.  Psych: Alert and oriented. Cooperative, Appropriate mood and affect, Normal judgment.  CV: RRR. No murmur. No lower extremity edema.  MSK: Normal ambulation. No clubbing or cyanosis.  Neuro:  CN II-XII grossly normal.    There were no vitals taken for this visit. Wt Readings from Last 3 Encounters:  02/05/24 163 lb 8 oz (74.2 kg)  08/05/23 168 lb (76.2 kg)  01/31/23 172 lb 12.8 oz (78.4 kg)       Assessment & Plan:   Problem List Items Addressed This Visit   None   I am having Shamel J. Waddell maintain her metFORMIN, cholecalciferol, RA SUNSCREEN SPF50 EX, Lidocaine (Anorectal), PRESCRIPTION MEDICATION, NON FORMULARY, NON FORMULARY, Polyethyl Glycol-Propyl Glycol, Probiotic Product (  DIGESTIVE ADVANTAGE GUMMIES PO), diphenhydrAMINE HCl (Sleep), Magnesium Oxide -Mg Supplement, Biotene/Calcium  PBF, vitamin C , aspirin  EC, zinc  gluconate, OneTouch Verio, folic acid , Jardiance, atorvastatin , losartan , alendronate , amLODipine , and omeprazole .  No orders of the defined types were placed in this encounter.

## 2024-04-14 ENCOUNTER — Ambulatory Visit (INDEPENDENT_AMBULATORY_CARE_PROVIDER_SITE_OTHER): Admitting: Student

## 2024-04-14 ENCOUNTER — Encounter: Payer: Self-pay | Admitting: Student

## 2024-04-14 VITALS — BP 122/78 | HR 87 | Temp 98.2°F | Resp 12 | Ht 65.0 in | Wt 169.2 lb

## 2024-04-14 DIAGNOSIS — H938X3 Other specified disorders of ear, bilateral: Secondary | ICD-10-CM

## 2024-04-14 DIAGNOSIS — E1159 Type 2 diabetes mellitus with other circulatory complications: Secondary | ICD-10-CM

## 2024-04-14 NOTE — Progress Notes (Signed)
   Acute Office Visit  Subjective:     Patient ID: Lori Zamora, female    DOB: 1955-10-03, 68 y.o.   MRN: [id number]  Chief Complaint  Patient presents with   needs ears flushed    HPI Patient is in today for ear irrigation. She has been using Debrox at home 3-4 days over the past week. Was not having success with Debrox alone and would like an ear irrigation today. She feels impaction has made it more difficult to hear out of her right ear.   Patient denies fever, chills, SOB, CP, palpitations, dyspnea, edema, HA, vision changes, N/V/D, abdominal pain, urinary symptoms, rash, weight changes, and recent illness or hospitalizations.    ROS  See HPI     Objective:    BP 122/78 (BP Location: Right Arm, Patient Position: Sitting, Cuff Size: Normal)   Pulse 87   Temp 98.2 F (36.8 C) (Oral)   Resp 12   Ht 5' 5 (1.651 m)   Wt 169 lb 3.2 oz (76.7 kg)   SpO2 97%   BMI 28.16 kg/m    Physical Exam  General: No acute distress. Awake and conversant.  Eyes: Normal conjunctiva, anicteric. Round symmetric pupils.  ENT: TMs WNL post-irrigation Respiratory: CTAB. Respirations are non-labored. No wheezing.  Skin: Warm. No rashes or ulcers.  Psych: Alert and oriented. CV: RRR. No murmur. No lower extremity edema.  MSK: Normal ambulation. No clubbing or cyanosis.  Neuro:  CN II-XII grossly normal.    No results found for any visits on 04/14/24.      Assessment & Plan:   Problem List Items Addressed This Visit     Diabetes mellitus (HCC)   hgba1c acceptable, minimize simple carbs. Increase exercise as tolerated. Continue current meds. UTD on Opthl. Exam and foot exam Urine ACR today      Relevant Orders   Microalbumin / creatinine urine ratio   Irritation of ear, bilateral - Primary   TMs within normal limits post-irrigation. Continue Debrox as needed and use OTC Debrox PRN for maintenance. Follow up in clinic as needed. Pt reported better hearing out of right ear  post irrigation.  Referral for audiology offered for hearing testing.  Patient defers at this time but will follow-up if needed.      Portions of this note were dictated using DRAGON voice recognition software. Please disregard any errors in transcription.      No orders of the defined types were placed in this encounter.   No follow-ups on file.  Romelo Sciandra L Takeysha Bonk, NP

## 2024-04-14 NOTE — Assessment & Plan Note (Addendum)
 TMs within normal limits post-irrigation. Continue Debrox as needed and use OTC Debrox PRN for maintenance. Follow up in clinic as needed. Pt reported better hearing out of right ear post irrigation.  Referral for audiology offered for hearing testing.  Patient defers at this time but will follow-up if needed.

## 2024-04-14 NOTE — Assessment & Plan Note (Signed)
 hgba1c acceptable, minimize simple carbs. Increase exercise as tolerated. Continue current meds. UTD on Opthl. Exam and foot exam Urine ACR today

## 2024-04-15 ENCOUNTER — Ambulatory Visit: Payer: Self-pay | Admitting: Student

## 2024-04-15 LAB — MICROALBUMIN / CREATININE URINE RATIO
Creatinine,U: 93.9 mg/dL
Microalb Creat Ratio: 13.7 mg/g (ref 0.0–30.0)
Microalb, Ur: 1.3 mg/dL (ref 0.0–1.9)

## 2024-04-16 ENCOUNTER — Other Ambulatory Visit: Payer: Self-pay | Admitting: Family Medicine

## 2024-04-22 ENCOUNTER — Other Ambulatory Visit: Payer: Self-pay | Admitting: Family Medicine

## 2024-04-28 ENCOUNTER — Ambulatory Visit (INDEPENDENT_AMBULATORY_CARE_PROVIDER_SITE_OTHER): Admitting: *Deleted

## 2024-04-28 VITALS — BP 131/69 | Ht 65.5 in | Wt 167.8 lb

## 2024-04-28 DIAGNOSIS — Z0000 Encounter for general adult medical examination without abnormal findings: Secondary | ICD-10-CM | POA: Diagnosis not present

## 2024-04-28 NOTE — Progress Notes (Signed)
 Please attest this visit in the absence of patient primary care provider.    Subjective:   Lori Zamora is a 68 y.o. who presents for a Medicare Wellness preventive visit.  As a reminder, Annual Wellness Visits don't include a physical exam, and some assessments may be limited, especially if this visit is performed virtually. We may recommend an in-person follow-up visit with your provider if needed.  Visit Complete: Virtual I connected with  Lori Zamora on 04/28/24 by a audio enabled telemedicine application and verified that I am speaking with the correct person using two identifiers.  Patient Location: Home  Provider Location: Office/Clinic  I discussed the limitations of evaluation and management by telemedicine. The patient expressed understanding and agreed to proceed.  Vital Signs: Because this visit was a virtual/telehealth visit, some criteria may be missing or patient reported. Any vitals not documented were not able to be obtained and vitals that have been documented are patient reported.  VideoDeclined- This patient declined Librarian, academic. Therefore the visit was completed with audio only.  Persons Participating in Visit: Patient.  AWV Questionnaire: Yes: Patient Medicare AWV questionnaire was completed by the patient on 04/27/24; I have confirmed that all information answered by patient is correct and no changes since this date.  Cardiac Risk Factors include: advanced age (>88men, >79 women);hypertension;dyslipidemia;diabetes mellitus;Other (see comment), Risk factor comments: OSA, Sjogrens     Objective:    Today's Vitals   04/28/24 0957  BP: 131/69  Weight: 167 lb 12.8 oz (76.1 kg)  Height: 5' 5.5 (1.664 m)   Body mass index is 27.5 kg/m.     04/28/2024   10:14 AM 08/22/2018    8:48 AM 08/03/2015    2:06 PM  Advanced Directives  Does Patient Have a Medical Advance Directive? Yes No  Yes   Type of Lori educational needs teacher of [place];Living will  Healthcare Power of Attorney   Does patient want to make changes to medical advance directive? No - Patient declined    Copy of Healthcare Power of Attorney in Chart? No - copy requested    Would patient like information on creating a medical advance directive?  No - Patient declined       Data saved with a previous flowsheet row definition    Current Medications (verified) Outpatient Encounter Medications as of 04/28/2024  Medication Sig   alendronate  (FOSAMAX ) 70 MG tablet Take 1 tablet (70 mg total) by mouth every 7 (seven) days. Take with a full glass of water on an empty stomach.   amLODipine  (NORVASC ) 5 MG tablet TAKE 1 TABLET BY MOUTH IN THE MORNING AND AT BEDTIME   Ascorbic Acid (VITAMIN C ) 1000 MG tablet Take 1 tablet (1,000 mg total) by mouth daily.   aspirin  EC 81 MG tablet Take 1 tablet (81 mg total) by mouth daily.   atorvastatin  (LIPITOR) 20 MG tablet Take 3 tablets (60 mg total) by mouth daily.   cholecalciferol (VITAMIN D ) 1000 UNITS tablet Take 2,000 Units by mouth daily.   diphenhydrAMINE HCl, Sleep, 50 MG tablet Take by mouth.   folic acid  (FOLVITE ) 1 MG tablet TAKE 1 TABLET(1 MG) BY MOUTH DAILY   JARDIANCE 10 MG TABS tablet Take 10 mg by mouth every morning.   Lidocaine, Anorectal, 5 % CREA Apply topically as needed.   losartan  (COZAAR ) 25 MG tablet Take 1 tablet (25 mg total) by mouth daily.   Magnesium Oxide 500 MG TABS Take by mouth.  metFORMIN (GLUCOPHAGE) 500 MG tablet Take 1,000 mg by mouth 2 (two) times daily with a meal.   Mouthwashes (BIOTENE/CALCIUM  PBF) LIQD by Transmucosal route.   NON FORMULARY Lubricant eye ointment- sterile mineral oil 39.9% White Petroleum 57.7%   NON FORMULARY Biotene dry mouth oral rinse   omeprazole  (PRILOSEC) 20 MG capsule Take 1 capsule by mouth once daily   ONETOUCH VERIO test strip CHECK BLOOD SUGAR IN VITRO TWICE DAILY   Polyethyl Glycol-Propyl Glycol (SYSTANE) 0.4-0.3 % GEL ophthalmic  gel Apply to eye.   PRESCRIPTION MEDICATION prevident 5000- dry mouth toothpaste   Probiotic Product (DIGESTIVE ADVANTAGE GUMMIES PO) Take by mouth 2 (two) times daily.   RA SUNSCREEN SPF50 EX Apply topically as needed.   zinc  gluconate 50 MG tablet Take 50 mg by mouth daily.   No facility-administered encounter medications on file as of 04/28/2024.    Allergies (verified) Penicillins, Sulfa antibiotics, Sulfonamide derivatives, Acetazolamide, Azathioprine, Ezetimibe, Lisinopril, Meloxicam, Pravastatin sodium, Simvastatin, and Latex   History: 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   Bipolar 2 disorder (HCC) 01/31/2010   Qualifier: Diagnosis of  By: Dee CMA (AAMA), [place]  Follows with Dr Elodie Anon of psychiatry    Bipolar disorder, unspecified (HCC)    CFS (chronic fatigue syndrome)    Degeneration of intervertebral disc, site unspecified    Depressive disorder, not elsewhere classified    Esophageal reflux    FH: breast cancer 05/20/2017   Gastroparesis 06/07/2015   Hyperglycemia 01/20/2018   Intertriginous candidiasis 11/29/2013   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   Microcytic anemia 01/31/2010   Qualifier: Diagnosis of  By: Dee CMA (AAMA), Lugene     Migraine with aura, without mention of intractable migraine without mention of status migrainosus    Myalgia and myositis, unspecified    Nontoxic multinodular goiter    Other and unspecified hyperlipidemia    Other malaise and fatigue    Other specified iron deficiency anemias    Other thalassemia (HCC)    Parotid gland enlargement 2011   related to connective tissue syndrome   Pelvic floor dysfunction 01/27/2013   Peripheral edema 02/19/2017   Preventative health care 09/12/2014   Renal insufficiency 01/27/2013   Sjogren's syndrome (HCC)    Small intestinal bacterial  overgrowth 11/29/2013   Systemic lupus erythematosus (HCC)    Thrombocytosis 06/07/2016   Type II or unspecified type diabetes mellitus without mention of complication, not stated as uncontrolled    Unspecified constipation    Unspecified diffuse connective tissue disease 01/27/2013   Follows at [facility]ology, Dr Sheran Per patient has tested 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 antibody   Unspecified essential hypertension    Unspecified sleep apnea    Unspecified vitamin D  deficiency    Past Surgical History:  Procedure Laterality Date   BIOPSY THYROID      01/13/2015, 3 previous biopsy reported all benign   LAPAROSCOPIC TOTAL HYSTERECTOMY  2004   Family History  Problem Relation Age of Onset   Diabetes Mother    Heart disease Mother        family history   Allergies Mother    Hypertension Mother    Hypertension Father    Other Father        viral meningitis   Stroke Sister        4  in 2019   Hypertension Sister    Brain cancer Sister        glioblastoma   Cancer Sister        renal cancer   Cancer Sister 65       breast   Breast cancer Sister 68   Alcohol abuse Brother        drug abuse   Hypertension Brother    Stroke Brother    Heart disease Maternal Grandmother    Heart disease Maternal Grandfather    Alcohol abuse Other        Family history of alcoholism and addiction   Hypertension Other        family history   Kidney disease Other        family history   Stroke Other        1st degree relative <60   Social History   Socioeconomic History   Marital status: Single    Spouse name: Not on file   Number of children: 0   Years of education: Not on file   Highest education level: Professional school degree (e.g., MD, DDS, DVM, JD)  Occupational History   Occupation: Teacher  Tobacco Use   Smoking status: Never   Smokeless tobacco: Never  Vaping Use   Vaping status: Not on file  Substance and Sexual  Activity   Alcohol use: No   Drug use: No   Sexual activity: Never    Birth control/protection: None    Comment: lives alone, no major dietary restrictions, retired from teaching kindergarten  Other Topics Concern   Not on file  Social History Narrative   Former Runner, broadcasting/film/video, on disability   Psychologist, 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[employer])   Social Drivers of Health   Financial Resource Strain: Low Risk  (04/28/2024)   Overall Financial Resource Strain (CARDIA)    Difficulty of Paying Living Expenses: Not very hard  Food Insecurity: No Food Insecurity (04/28/2024)   Hunger Vital Sign    Worried About Running Out of Food in the Last Year: Never true    Ran Out of Food in the Last Year: Never true  Transportation Needs: No Transportation Needs (04/28/2024)   PRAPARE - Administrator, Civil Service (Medical): No    Lack of Transportation (Non-Medical): No  Physical Activity: Inactive (04/28/2024)   Exercise Vital Sign    Days of Exercise per Week: 0 days    Minutes of Exercise per Session: 0 min  Stress: No Stress Concern Present (04/28/2024)   [employer] of Occupational Health - Occupational Stress Questionnaire    Feeling of Stress: Not at all  Social Connections: Moderately Integrated (04/28/2024)   Social Connection and Isolation Panel    Frequency of Communication with Friends and Family: More than three times a week    Frequency of Social Gatherings with Friends and Family: More than three times a week    Attends Religious Services: More than 4 times per year    Active Member of [employer] or Organizations: Yes    Attends Banker Meetings: More than 4 times per year    Marital Status: Widowed    Tobacco Counseling Counseling given: Not Answered    Clinical Intake:  Pre-visit preparation completed: Yes  Pain : No/denies pain     BMI - recorded: 27.5 Nutritional Status: BMI 25 -29 Overweight Nutritional Risks: None Diabetes: Yes CBG done?: No Did pt. bring in CBG  monitor from home?: No  Lab Results  Component  Value Date   HGBA1C 6.8 12/10/2023   HGBA1C 6.9 06/11/2023   HGBA1C 7.4 (H) 06/15/2022     How often do you need to have someone help you when you read instructions, pamphlets, or other written materials from your doctor or pharmacy?: 1 - Never What is the last grade level you completed in school?: college  Interpreter Needed?: No  Information entered by :: Lolita Libra, CMA(AAMA)   Activities of Daily Living     04/27/2024   11:30 AM 02/05/2024   11:05 AM  In your present state of health, do you have any difficulty performing the following activities:  Hearing? 0 0  Vision? 0 0  Difficulty concentrating or making decisions? 0 0  Walking or climbing stairs? 0 0  Dressing or bathing? 0 0  Doing errands, shopping? 0 0  Preparing Food and eating ? N   Using the Toilet? N   In the past six months, have you accidently leaked urine? N   Do you have problems with loss of bowel control? N   Managing your Medications? N   Managing your Finances? N   Housekeeping or managing your Housekeeping? N     Patient Care Team: Domenica Harlene LABOR, MD as PCP - General (Family Medicine) Faythe Purchase, MD as Consulting Physician (Endocrinology) Leslee Reusing, MD as Consulting Physician (Ophthalmology) Wyatt Barter, MD as Referring Physician (Gastroenterology) Debarah Charlie Franky Mickey., MD as Referring Physician (Gastroenterology)  I have updated your Care Teams any recent Medical Services you may have received from other providers in the past year.     Assessment:   This is a routine wellness examination for [place].  Hearing/Vision screen Hearing Screening - Comments:: Denies hearing difficulties.  Vision Screening - Comments:: Up to date with routine eye exams with [facility]. Has appt in december    Goals Addressed   None    Depression Screen     04/28/2024   10:11 AM 04/14/2024    2:34 PM 02/05/2024   11:05 AM  01/31/2023   11:35 AM 10/01/2022   10:44 AM 06/07/2022    8:51 AM 12/06/2021    8:19 AM  PHQ 2/9 Scores  PHQ - 2 Score 0 0 0 0 0 0 0  PHQ- 9 Score 3 0  0  0     Fall Risk     04/27/2024   11:30 AM 04/14/2024    2:34 PM 02/05/2024   11:05 AM 01/31/2023   11:34 AM 10/01/2022   10:44 AM  Fall Risk   Falls in the past year? 0 0 0 0 0  Number falls in past yr: 0 0 0 0 0  Injury with Fall? 0  0 0 0  Risk for fall due to : Impaired mobility No Fall Risks   No Fall Risks  Follow up Education provided Falls evaluation completed Falls evaluation completed;Education provided Falls evaluation completed Falls evaluation completed    MEDICARE RISK AT HOME:  Medicare Risk at Home Any stairs in or around the home?: (Patient-Rptd) Yes If so, are there any without handrails?: (Patient-Rptd) No Home free of loose throw rugs in walkways, pet beds, electrical cords, etc?: (Patient-Rptd) Yes Adequate lighting in your home to reduce risk of falls?: (Patient-Rptd) Yes Life alert?: (Patient-Rptd) No Use of a cane, walker or w/c?: (Patient-Rptd) No Grab bars in the bathroom?: (Patient-Rptd) Yes Shower chair or bench in shower?: No Elevated toilet seat or a handicapped toilet?: (Patient-Rptd) No  TIMED UP AND GO:  Was the test performed?  No,audio  Cognitive Function: 6CIT completed        04/28/2024   10:15 AM  6CIT Screen  What Year? 0 points  What month? 0 points  What time? 0 points  Count back from 20 0 points  Months in reverse 0 points  Repeat phrase 0 points  Total Score 0 points    Immunizations Immunization History  Administered Date(s) Administered   Hep A / Hep B 05/11/2013, 06/10/2013, 11/09/2013   Hep A, Unspecified 05/11/2013   Hep B, Unspecified 05/11/2013, 06/10/2013   Influenza Split 05/25/2011, 05/29/2012   Influenza Whole 05/28/2008   Influenza,inj,Quad PF,6+ Mos 05/18/2013, 06/04/2014, 06/07/2015, 06/07/2016   Influenza-Unspecified 06/07/2016   Pneumococcal  Conjugate-13 07/27/2013   Pneumococcal Polysaccharide-23 12/16/2003   Td 12/16/2003   Tdap 11/26/2013    Screening Tests Health Maintenance  Topic Date Due   Influenza Vaccine  11/10/2024 (Originally 03/13/2024)   DTaP/Tdap/Td (3 - Td or Tdap) 02/04/2025 (Originally 11/27/2023)   Pneumococcal Vaccine: 50+ Years (3 of 3 - PCV20 or PCV21) 02/04/2025 (Originally 07/27/2018)   Medicare Annual Wellness (AWV)  04/14/2025 (Originally 04-02-1956)   HEMOGLOBIN A1C  06/10/2024   OPHTHALMOLOGY EXAM  07/15/2024   FOOT EXAM  12/09/2024   Diabetic kidney evaluation - eGFR measurement  02/04/2025   Diabetic kidney evaluation - Urine ACR  04/14/2025   Mammogram  02/10/2026   Colonoscopy  03/06/2028   DEXA SCAN  Completed   Hepatitis C Screening  Completed   HPV VACCINES  Aged Out   Meningococcal B Vaccine  Aged Out   Hepatitis B Vaccines 19-59 Average Risk  Discontinued   COVID-19 Vaccine  Discontinued   Zoster Vaccines- Shingrix  Discontinued    Health Maintenance Items Addressed: Pt declines: flu, pneumonia and tetanus vaccines. Has eye exam scheduled.  Additional Screening:  Vision Screening: Recommended annual ophthalmology exams for early detection of glaucoma and other disorders of the eye. Is the patient up to date with their annual eye exam?  Yes  Who is the provider or what is the name of the office in which the patient attends annual eye exams? [facility]  Dental Screening: Recommended annual dental exams for proper oral hygiene  Community Resource Referral / Chronic Care Management: CRR required this visit?  No   CCM required this visit?  No   Plan:    I have personally reviewed and noted the following in the patient's chart:   Medical and social history Use of alcohol, tobacco or illicit drugs  Current medications and supplements including opioid prescriptions. Patient is not currently taking opioid prescriptions. Functional ability and status Nutritional  status Physical activity Advanced directives List of other physicians Hospitalizations, surgeries, and ER visits in previous 12 months Vitals Screenings to include cognitive, depression, and falls Referrals and appointments  In addition, I have reviewed and discussed with patient certain preventive protocols, quality metrics, and best practice recommendations. A written personalized care plan for preventive services as well as general preventive health recommendations were provided to patient.   Lolita Libra, CMA   04/28/2024   After Visit Summary: (MyChart) Due to this being a telephonic visit, the after visit summary with patients personalized plan was offered to patient via MyChart   Notes: Nothing significant to report at this time.

## 2024-04-28 NOTE — Patient Instructions (Signed)
 Lori Zamora , Thank you for taking time out of your busy schedule to complete your Annual Wellness Visit with me. I enjoyed our conversation and look forward to speaking with you again next year. I, as well as your care team,  appreciate your ongoing commitment to your health goals. Please review the following plan we discussed and let me know if I can assist you in the future. Your Game plan/ To Do List      Follow up Visits: Next Medicare AWV with our clinical staff: 05/04/25 9:40am, telephone   Next Office Visit with your provider: 07/30/24 8:20am, Dr Domenica  Clinician Recommendations:  Aim for 30 minutes of exercise or brisk walking, 6-8 glasses of water, and 5 servings of fruits and vegetables each day.       This is a list of the screening recommended for you and due dates:  Health Maintenance  Topic Date Due   Flu Shot  11/10/2024*   DTaP/Tdap/Td vaccine (3 - Td or Tdap) 02/04/2025*   Pneumococcal Vaccine for age over 47 (3 of 3 - PCV20 or PCV21) 02/04/2025*   Medicare Annual Wellness Visit  04/14/2025*   Hemoglobin A1C  06/10/2024   Eye exam for diabetics  07/15/2024   Complete foot exam   12/09/2024   Yearly kidney function blood test for diabetes  02/04/2025   Yearly kidney health urinalysis for diabetes  04/14/2025   Breast Cancer Screening  02/10/2026   Colon Cancer Screening  03/06/2028   DEXA scan (bone density measurement)  Completed   Hepatitis C Screening  Completed   HPV Vaccine  Aged Out   Meningitis B Vaccine  Aged Out   Hepatitis B Vaccine  Discontinued   COVID-19 Vaccine  Discontinued   Zoster (Shingles) Vaccine  Discontinued  *Topic was postponed. The date shown is not the original due date.    Advanced directives: (ACP Link)Information on Advanced Care Planning can be found at [place]  Secretary of [facility] Advance Health Care Directives Advance Health Care Directives. [email or web address]  Advance Care Planning is important because it:  [x]  Makes sure you  receive the medical care that is consistent with your values, goals, and preferences  [x]  It provides guidance to your family and loved ones and reduces their decisional burden about whether or not they are making the right decisions based on your wishes.  Follow the link provided in your after visit summary or read over the paperwork we have mailed to you to help you started getting your Advance Directives in place. If you need assistance in completing these, please reach out to us  so that we can help you!  See attachments for Preventive Care and Fall Prevention Tips.

## 2024-05-20 ENCOUNTER — Other Ambulatory Visit: Payer: Self-pay | Admitting: Family Medicine

## 2024-06-08 DIAGNOSIS — Z7984 Long term (current) use of oral hypoglycemic drugs: Secondary | ICD-10-CM | POA: Diagnosis not present

## 2024-06-08 DIAGNOSIS — E042 Nontoxic multinodular goiter: Secondary | ICD-10-CM | POA: Diagnosis not present

## 2024-06-08 DIAGNOSIS — K3184 Gastroparesis: Secondary | ICD-10-CM | POA: Diagnosis not present

## 2024-06-08 DIAGNOSIS — E119 Type 2 diabetes mellitus without complications: Secondary | ICD-10-CM | POA: Diagnosis not present

## 2024-06-08 LAB — VITAMIN B12: Vitamin B-12: 110

## 2024-06-08 LAB — HEMOGLOBIN A1C: Hemoglobin A1C: 7.1

## 2024-07-22 DIAGNOSIS — E119 Type 2 diabetes mellitus without complications: Secondary | ICD-10-CM | POA: Diagnosis not present

## 2024-07-22 DIAGNOSIS — H5203 Hypermetropia, bilateral: Secondary | ICD-10-CM | POA: Diagnosis not present

## 2024-07-22 DIAGNOSIS — H40013 Open angle with borderline findings, low risk, bilateral: Secondary | ICD-10-CM | POA: Diagnosis not present

## 2024-07-22 DIAGNOSIS — H2513 Age-related nuclear cataract, bilateral: Secondary | ICD-10-CM | POA: Diagnosis not present

## 2024-07-22 LAB — OPHTHALMOLOGY REPORT-SCANNED

## 2024-07-26 NOTE — Assessment & Plan Note (Signed)
 Supplement and monitor

## 2024-07-26 NOTE — Assessment & Plan Note (Signed)
 Well controlled, no changes to meds. Encouraged heart healthy diet such as the DASH diet and exercise as tolerated.

## 2024-07-26 NOTE — Progress Notes (Unsigned)
 Subjective:    Patient ID: Lori Zamora, female    DOB: 11-Aug-1956, 68 y.o.   MRN: [id number]  No chief complaint on file.   HPI Discussed the use of AI scribe software for clinical note transcription with the patient, who gave verbal consent to proceed.  History of Present Illness     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   Bipolar 2 disorder (HCC) 01/31/2010   Qualifier: Diagnosis of  By: Dee CMA (AAMA), Lugene  Follows with Dr Elodie Anon of psychiatry    Bipolar disorder, unspecified (HCC)    CFS (chronic fatigue syndrome)    Degeneration of intervertebral disc, site unspecified    Depressive disorder, not elsewhere classified    Esophageal reflux    FH: breast cancer 05/20/2017   Gastroparesis 06/07/2015   Hyperglycemia 01/20/2018   Intertriginous candidiasis 11/29/2013   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   Microcytic anemia 01/31/2010   Qualifier: Diagnosis of  By: Dee CMA (AAMA), Lugene     Migraine with aura, without mention of intractable migraine without mention of status migrainosus    Myalgia and myositis, unspecified    Nontoxic multinodular goiter    Other and unspecified hyperlipidemia    Other malaise and fatigue    Other specified iron deficiency anemias    Other thalassemia    Parotid gland enlargement 2011   related to connective tissue syndrome   Pelvic floor dysfunction 01/27/2013   Peripheral edema 02/19/2017   Preventative health care 09/12/2014   Renal insufficiency 01/27/2013   Sjogren's syndrome    Small intestinal bacterial overgrowth 11/29/2013   Systemic lupus erythematosus (HCC)    Thrombocytosis 06/07/2016   Type II or unspecified type diabetes mellitus without mention of complication, not stated as uncontrolled    Unspecified constipation     Unspecified diffuse connective tissue disease 01/27/2013   Follows at [facility] rheumatology, Dr Sheran Per patient has tested 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 antibody   Unspecified essential hypertension    Unspecified sleep apnea    Unspecified vitamin D  deficiency     Past Surgical History:  Procedure Laterality Date   BIOPSY THYROID      01/13/2015, 3 previous biopsy reported all benign   LAPAROSCOPIC TOTAL HYSTERECTOMY  2004    Family History  Problem Relation Age of Onset   Diabetes Mother    Heart disease Mother        family history   Allergies Mother    Hypertension Mother    Hypertension Father    Other Father        viral meningitis   Stroke Sister        4 in 2019   Hypertension Sister    Brain cancer Sister        glioblastoma   Cancer Sister        renal cancer   Cancer Sister 4       breast   Breast cancer Sister 36   Alcohol abuse Brother        drug abuse   Hypertension Brother    Stroke Brother    Heart disease Maternal Grandmother    Heart disease Maternal Grandfather    Alcohol abuse Other        Family history of alcoholism and addiction   Hypertension Other  family history   Kidney disease Other        family history   Stroke Other        1st degree relative <60    Social History   Socioeconomic History   Marital status: Single    Spouse name: Not on file   Number of children: 0   Years of education: Not on file   Highest education level: Master's degree (e.g., MA, MS, MEng, MEd, MSW, MBA)  Occupational History   Occupation: Runner, Broadcasting/film/video  Tobacco Use   Smoking status: Never   Smokeless tobacco: Never  Vaping Use   Vaping status: Not on file  Substance and Sexual Activity   Alcohol use: No   Drug use: No   Sexual activity: Never    Birth control/protection: None    Comment: lives alone, no major dietary restrictions, retired from teaching  kindergarten  Other Topics Concern   Not on file  Social History Narrative   Former runner, broadcasting/film/video, on disability   Psychologist, 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[employer])   Social Drivers of Health   Tobacco Use: Low Risk (04/28/2024)   Patient History    Smoking Tobacco Use: Never    Smokeless Tobacco Use: Never    Passive Exposure: Not on file  Financial Resource Strain: Low Risk (07/23/2024)   Overall Financial Resource Strain (CARDIA)    Difficulty of Paying Living Expenses: Not hard at all  Food Insecurity: No Food Insecurity (07/23/2024)   Epic    Worried About Radiation Protection Practitioner of Food in the Last Year: Never true    Ran Out of Food in the Last Year: Never true  Transportation Needs: No Transportation Needs (07/23/2024)   Epic    Lack of Transportation (Medical): No    Lack of Transportation (Non-Medical): No  Physical Activity: Inactive (07/23/2024)   Exercise Vital Sign    Days of Exercise per Week: 0 days    Minutes of Exercise per Session: Not on file  Stress: No Stress Concern Present (07/23/2024)   [employer] of Occupational Health - Occupational Stress Questionnaire    Feeling of Stress: Not at all  Social Connections: Moderately Isolated (07/23/2024)   Social Connection and Isolation Panel    Frequency of Communication with Friends and Family: More than three times a week    Frequency of Social Gatherings with Friends and Family: More than three times a week    Attends Religious Services: Patient declined    Active Member of Clubs or Organizations: Yes    Attends Banker Meetings: More than 4 times per year    Marital Status: Never married  Intimate Partner Violence: Not At Risk (04/28/2024)   Epic    Fear of Current or Ex-Partner: No    Emotionally Abused: No    Physically Abused: No    Sexually Abused: No  Depression (PHQ2-9): Low Risk (04/28/2024)   Depression (PHQ2-9)    PHQ-2 Score: 3  Alcohol Screen: Low Risk (04/28/2024)   Alcohol Screen    Last  Alcohol Screening Score (AUDIT): 0  Housing: Low Risk (07/23/2024)   Epic    Unable to Pay for Housing in the Last Year: No    Number of Times Moved in the Last Year: 1    Homeless in the Last Year: No  Utilities: Not At Risk (04/28/2024)   Epic    Threatened with loss of utilities: No  Health Literacy: Adequate Health Literacy (04/28/2024)   B1300 Health Literacy    Frequency of  need for help with medical instructions: Never    Outpatient Medications Prior to Visit  Medication Sig Dispense Refill   alendronate  (FOSAMAX ) 70 MG tablet Take 1 tablet (70 mg total) by mouth every 7 (seven) days. Take with a full glass of water on an empty stomach. 4 tablet 11   amLODipine  (NORVASC ) 5 MG tablet Take 1 tablet (5 mg total) by mouth in the morning and at bedtime. 180 tablet 1   Ascorbic Acid (VITAMIN C ) 1000 MG tablet Take 1 tablet (1,000 mg total) by mouth daily.     aspirin  EC 81 MG tablet Take 1 tablet (81 mg total) by mouth daily.     atorvastatin  (LIPITOR) 20 MG tablet Take 3 tablets (60 mg total) by mouth daily. 270 tablet 1   cholecalciferol (VITAMIN D ) 1000 UNITS tablet Take 2,000 Units by mouth daily.     diphenhydrAMINE HCl, Sleep, 50 MG tablet Take by mouth.     folic acid  (FOLVITE ) 1 MG tablet TAKE 1 TABLET(1 MG) BY MOUTH DAILY 90 tablet 3   JARDIANCE 10 MG TABS tablet Take 10 mg by mouth every morning.     Lidocaine, Anorectal, 5 % CREA Apply topically as needed.     losartan  (COZAAR ) 25 MG tablet Take 1 tablet (25 mg total) by mouth daily. 90 tablet 1   Magnesium Oxide 500 MG TABS Take by mouth.     metFORMIN (GLUCOPHAGE) 500 MG tablet Take 1,000 mg by mouth 2 (two) times daily with a meal.     Mouthwashes (BIOTENE/CALCIUM  PBF) LIQD by Transmucosal route.     NON FORMULARY Lubricant eye ointment- sterile mineral oil 39.9% White Petroleum 57.7%     NON FORMULARY Biotene dry mouth oral rinse     omeprazole  (PRILOSEC) 20 MG capsule Take 1 capsule by mouth once  daily 90 capsule 3   ONETOUCH VERIO test strip CHECK BLOOD SUGAR IN VITRO TWICE DAILY 200 strip 2   Polyethyl Glycol-Propyl Glycol (SYSTANE) 0.4-0.3 % GEL ophthalmic gel Apply to eye.     PRESCRIPTION MEDICATION prevident 5000- dry mouth toothpaste     Probiotic Product (DIGESTIVE ADVANTAGE GUMMIES PO) Take by mouth 2 (two) times daily.     RA SUNSCREEN SPF50 EX Apply topically as needed.     zinc  gluconate 50 MG tablet Take 50 mg by mouth daily.     No facility-administered medications prior to visit.    Allergies[1]  Review of Systems  Constitutional:  Negative for fever and malaise/fatigue.  HENT:  Negative for congestion.   Eyes:  Negative for blurred vision.  Respiratory:  Negative for shortness of breath.   Cardiovascular:  Negative for chest pain, palpitations and leg swelling.  Gastrointestinal:  Negative for abdominal pain, blood in stool and nausea.  Genitourinary:  Negative for dysuria and frequency.  Musculoskeletal:  Negative for falls.  Skin:  Negative for rash.  Neurological:  Negative for dizziness, loss of consciousness and headaches.  Endo/Heme/Allergies:  Negative for environmental allergies.  Psychiatric/Behavioral:  Negative for depression. The patient is not nervous/anxious.        Objective:    Physical Exam Constitutional:      General: She is not in acute distress.    Appearance: Normal appearance. She is well-developed. She is not toxic-appearing.  HENT:     Head: Normocephalic and atraumatic.     Right Ear: External ear normal.     Left Ear: External ear normal.     Nose: Nose normal.  Eyes:  General:        Right eye: No discharge.        Left eye: No discharge.     Conjunctiva/sclera: Conjunctivae normal.  Neck:     Thyroid : No thyromegaly.  Cardiovascular:     Rate and Rhythm: Normal rate and regular rhythm.     Heart sounds: Normal heart sounds. No murmur heard. Pulmonary:     Effort: Pulmonary effort is normal. No respiratory  distress.     Breath sounds: Normal breath sounds.  Abdominal:     General: Bowel sounds are normal.     Palpations: Abdomen is soft.     Tenderness: There is no abdominal tenderness. There is no guarding.  Musculoskeletal:        General: Normal range of motion.     Cervical back: Neck supple.  Lymphadenopathy:     Cervical: No cervical adenopathy.  Skin:    General: Skin is warm and dry.  Neurological:     Mental Status: She is alert and oriented to person, place, and time.  Psychiatric:        Mood and Affect: Mood normal.        Behavior: Behavior normal.        Thought Content: Thought content normal.        Judgment: Judgment normal.    There were no vitals taken for this visit. Wt Readings from Last 3 Encounters:  04/28/24 167 lb 12.8 oz (76.1 kg)  04/14/24 169 lb 3.2 oz (76.7 kg)  02/05/24 163 lb 8 oz (74.2 kg)    Diabetic Foot Exam - Simple   No data filed    Lab Results  Component Value Date   WBC 7.3 02/05/2024   HGB 12.4 02/05/2024   HCT 39.5 02/05/2024   PLT 454.0 (H) 02/05/2024   GLUCOSE 96 02/05/2024   CHOL 161 02/05/2024   TRIG 94.0 02/05/2024   HDL 48.00 02/05/2024   LDLDIRECT 95 12/22/2009   LDLCALC 94 02/05/2024   ALT 17 02/05/2024   AST 15 02/05/2024   NA 140 02/05/2024   K 4.4 02/05/2024   CL 102 02/05/2024   CREATININE 0.73 02/05/2024   BUN 12 02/05/2024   CO2 30 02/05/2024   TSH 1.01 02/05/2024   HGBA1C 6.8 12/10/2023   MICROALBUR 1.3 04/14/2024    Lab Results  Component Value Date   TSH 1.01 02/05/2024   Lab Results  Component Value Date   WBC 7.3 02/05/2024   HGB 12.4 02/05/2024   HCT 39.5 02/05/2024   MCV 78.1 02/05/2024   PLT 454.0 (H) 02/05/2024   Lab Results  Component Value Date   NA 140 02/05/2024   K 4.4 02/05/2024   CO2 30 02/05/2024   GLUCOSE 96 02/05/2024   BUN 12 02/05/2024   CREATININE 0.73 02/05/2024   BILITOT 0.4 02/05/2024   ALKPHOS 107 02/05/2024   AST 15 02/05/2024   ALT 17 02/05/2024   PROT  6.9 02/05/2024   ALBUMIN 4.4 02/05/2024   CALCIUM  9.6 02/05/2024   ANIONGAP 10 08/22/2018   GFR 85.34 02/05/2024   Lab Results  Component Value Date   CHOL 161 02/05/2024   Lab Results  Component Value Date   HDL 48.00 02/05/2024   Lab Results  Component Value Date   LDLCALC 94 02/05/2024   Lab Results  Component Value Date   TRIG 94.0 02/05/2024   Lab Results  Component Value Date   CHOLHDL 3 02/05/2024   Lab Results  Component  Value Date   HGBA1C 6.8 12/10/2023       Assessment & Plan:  Vitamin D  deficiency Assessment & Plan: Supplement and monitor   Renal insufficiency Assessment & Plan: Hydrate and monitor   Hyperlipidemia, mixed Assessment & Plan: Encourage heart healthy diet such as MIND or DASH diet, increase exercise, avoid trans fats, simple carbohydrates and processed foods, consider a krill or fish or flaxseed oil cap daily. Tolerating Atorvastatin    Essential hypertension Assessment & Plan: Well controlled, no changes to meds. Encouraged heart healthy diet such as the DASH diet and exercise as tolerated.    Type 2 diabetes mellitus with other circulatory complication, without long-term current use of insulin (HCC) Assessment & Plan: hgba1c acceptable, minimize simple carbs. Increase exercise as tolerated. Continue current meds      Assessment and Plan Assessment & Plan      Harlene Horton, MD     [1] Allergies Allergen Reactions   Penicillins Swelling and Anaphylaxis    Swelling of tongue.   Sulfa Antibiotics Other (See Comments) and Rash    Other Reaction: swellin of tongue Other Reaction: swellin of tongue   Sulfonamide Derivatives Swelling    REACTION: Swelling of tongue and face   Acetazolamide    Azathioprine Other (See Comments)    Very low TPMT in deficient range Unknown   Ezetimibe Other (See Comments)    Unknown   Lisinopril Other (See Comments)    Unknown   Meloxicam Other (See Comments)    Unknown    Pravastatin Sodium Other (See Comments)    Unknown   Simvastatin Other (See Comments)    Unknown   Latex Hives and Rash

## 2024-07-26 NOTE — Assessment & Plan Note (Signed)
 Encourage heart healthy diet such as MIND or DASH diet, increase exercise, avoid trans fats, simple carbohydrates and processed foods, consider a krill or fish or flaxseed oil cap daily. Tolerating Atorvastatin

## 2024-07-26 NOTE — Assessment & Plan Note (Signed)
 hgba1c acceptable, minimize simple carbs. Increase exercise as tolerated. Continue current meds

## 2024-07-26 NOTE — Assessment & Plan Note (Signed)
 Hydrate and monitor

## 2024-07-30 ENCOUNTER — Ambulatory Visit: Admitting: Family Medicine

## 2024-07-30 ENCOUNTER — Ambulatory Visit (INDEPENDENT_AMBULATORY_CARE_PROVIDER_SITE_OTHER): Admitting: Family Medicine

## 2024-07-30 ENCOUNTER — Encounter: Payer: Self-pay | Admitting: Family Medicine

## 2024-07-30 VITALS — BP 128/82 | HR 67 | Temp 98.1°F | Resp 16 | Ht 65.5 in | Wt 164.0 lb

## 2024-07-30 DIAGNOSIS — L739 Follicular disorder, unspecified: Secondary | ICD-10-CM | POA: Diagnosis not present

## 2024-07-30 DIAGNOSIS — Z7984 Long term (current) use of oral hypoglycemic drugs: Secondary | ICD-10-CM

## 2024-07-30 DIAGNOSIS — E1159 Type 2 diabetes mellitus with other circulatory complications: Secondary | ICD-10-CM

## 2024-07-30 DIAGNOSIS — E559 Vitamin D deficiency, unspecified: Secondary | ICD-10-CM | POA: Diagnosis not present

## 2024-07-30 DIAGNOSIS — E538 Deficiency of other specified B group vitamins: Secondary | ICD-10-CM

## 2024-07-30 DIAGNOSIS — N289 Disorder of kidney and ureter, unspecified: Secondary | ICD-10-CM | POA: Diagnosis not present

## 2024-07-30 DIAGNOSIS — I10 Essential (primary) hypertension: Secondary | ICD-10-CM | POA: Diagnosis not present

## 2024-07-30 DIAGNOSIS — E782 Mixed hyperlipidemia: Secondary | ICD-10-CM

## 2024-07-30 LAB — CBC WITH DIFFERENTIAL/PLATELET
Basophils Absolute: 0.1 K/uL (ref 0.0–0.1)
Basophils Relative: 1.1 % (ref 0.0–3.0)
Eosinophils Absolute: 0.1 K/uL (ref 0.0–0.7)
Eosinophils Relative: 0.9 % (ref 0.0–5.0)
HCT: 40.2 % (ref 36.0–46.0)
Hemoglobin: 12.7 g/dL (ref 12.0–15.0)
Lymphocytes Relative: 28 % (ref 12.0–46.0)
Lymphs Abs: 1.9 K/uL (ref 0.7–4.0)
MCHC: 31.5 g/dL (ref 30.0–36.0)
MCV: 78.5 fl (ref 78.0–100.0)
Monocytes Absolute: 0.3 K/uL (ref 0.1–1.0)
Monocytes Relative: 5 % (ref 3.0–12.0)
Neutro Abs: 4.3 K/uL (ref 1.4–7.7)
Neutrophils Relative %: 65 % (ref 43.0–77.0)
Platelets: 429 K/uL — ABNORMAL HIGH (ref 150.0–400.0)
RBC: 5.12 Mil/uL — ABNORMAL HIGH (ref 3.87–5.11)
RDW: 18 % — ABNORMAL HIGH (ref 11.5–15.5)
WBC: 6.6 K/uL (ref 4.0–10.5)

## 2024-07-30 LAB — COMPREHENSIVE METABOLIC PANEL WITH GFR
ALT: 9 U/L (ref 3–35)
AST: 12 U/L (ref 5–37)
Albumin: 4.4 g/dL (ref 3.5–5.2)
Alkaline Phosphatase: 102 U/L (ref 39–117)
BUN: 13 mg/dL (ref 6–23)
CO2: 26 meq/L (ref 19–32)
Calcium: 9.5 mg/dL (ref 8.4–10.5)
Chloride: 104 meq/L (ref 96–112)
Creatinine, Ser: 0.63 mg/dL (ref 0.40–1.20)
GFR: 91.74 mL/min (ref >60.00)
Glucose, Bld: 76 mg/dL (ref 70–99)
Potassium: 4.1 meq/L (ref 3.5–5.1)
Sodium: 143 meq/L (ref 135–145)
Total Bilirubin: 0.4 mg/dL (ref 0.2–1.2)
Total Protein: 6.8 g/dL (ref 6.0–8.3)

## 2024-07-30 LAB — VITAMIN B12: Vitamin B-12: 456 pg/mL (ref 211–911)

## 2024-07-30 LAB — VITAMIN D 25 HYDROXY (VIT D DEFICIENCY, FRACTURES): VITD: 59.99 ng/mL (ref 30.00–100.00)

## 2024-07-30 LAB — LIPID PANEL
Cholesterol: 130 mg/dL (ref 28–200)
HDL: 39.2 mg/dL (ref >39.00)
LDL Cholesterol: 71 mg/dL (ref 10–99)
NonHDL: 90.73
Total CHOL/HDL Ratio: 3
Triglycerides: 99 mg/dL (ref 10.0–149.0)
VLDL: 19.8 mg/dL (ref 0.0–40.0)

## 2024-07-30 LAB — TSH: TSH: 1.16 u[IU]/mL (ref 0.35–5.50)

## 2024-07-30 MED ORDER — CIPROFLOXACIN HCL 250 MG PO TABS: 250.0000 mg | ORAL_TABLET | Freq: Two times a day (BID) | ORAL | 0 refills | Status: AC

## 2024-07-30 NOTE — Patient Instructions (Signed)
Pernicious Anemia  Pernicious anemia is a condition where your body cannot absorb enough vitamin B12 from your digestive tract. Without vitamin B12, your body is not able to make the red blood cells that you need to carry oxygen in your body. Normally, you can get enough vitamin B12 from eating foods such as meat, poultry, eggs, and dairy products. If you have pernicious anemia, you do not absorb enough vitamin B12 from your diet, and anemia develops over time. When you have anemia, your organs may not work properly, and you may feel very tired. Untreated pernicious anemia can lead to severe symptoms of anemia, including chest pain, heart failure, and permanent nervous system damage. What are the causes? Pernicious anemia is believed to be an autoimmune disease. When you have an autoimmune disease, your body's defense system (immune system) mistakenly attacks normal cells in your body. Normally, your stomach makes a protein called intrinsic factor (IF). This protein helps your body absorb vitamin B12 in your intestines. When your stomach does not make enough IF, your intestines cannot absorb enough vitamin B12. Your stomach may not make enough IF because your immune system attacks the IF or the cells in the stomach that make it. What increases the risk? You are more likely to develop this condition if you: Are older than 67 years of age. Have a family history of pernicious anemia. Are of [place] or [place] descent. Have another type of autoimmune disease. What are the signs or symptoms? Many times there are no symptoms of this condition. Pernicious anemia symptoms may take many years to develop. Symptoms may include: Tiredness. Light-headedness. A sore tongue or a burning sensation on the tongue. A smooth red tongue. Stomach problems such as heartburn, diarrhea, or constipation. Shortness of breath, especially when walking or exercising. Signs of long-term damage to the nervous  system include: Being unsteady while walking. Tingling or numbness of hands and feet. Problems concentrating, confusion, or irritability. Mental health concerns, such as depression, hallucinations, or false beliefs (delusions). Loss of smell. How is this diagnosed? In many cases, anemia may be found after you have a routine blood test. This condition may also be diagnosed based on: Your medical history and symptoms you are having. A physical exam. Blood tests. A procedure to look at your stomach called an endoscopy. How is this treated? This condition may be treated with vitamin B12 replacement. This may include: Injections of vitamin B12. This is the most common treatment. Using a spray that you breathe in through your nose (nasal spray). A vitamin B12 nasal spray may be used to treat people who are not able to swallow supplements. Taking a pill by mouth that contains a large dose of vitamin B12. Long-term monitoring and regular visits to a health care provider. If the condition is found and treatment is started in the early stages, most people do not develop complications. Treatment reverses the condition and prevents future anemia, but it must be continued for life. Having pernicious anemia also puts you at higher risk for stomach cancer. Follow these instructions at home: Take over-the-counter and prescription medicines only as told by your health care provider. Return to your normal activities as told by your health care provider. Ask your health care provider what activities are safe for you. Eat a healthy diet that includes plenty of vegetables, fruits, whole grains, low-fat dairy products, and lean protein. Keep all follow-up visits. You will need to have your blood level of vitamin B12 checked regularly. Contact a  health care provider if: You develop new symptoms. Your symptoms return or get worse after treatment. You have stomach pain. You have symptoms of an infection, such as  a fever. You have trouble swallowing. Get help right away if: You have chest pain. You have a rapid or abnormal heartbeat. You have dizziness or you faint. You have trouble breathing. You have pain, swelling, or redness in an arm or leg. You vomit blood. These symptoms may be an emergency. Get help right away. Call 911. Do not wait to see if the symptoms will go away. Do not drive yourself to the hospital. Summary Pernicious anemia happens when your body cannot make enough red blood cells because it cannot absorb enough vitamin B12. This condition is usually caused by an autoimmune disease. Symptoms may take years to develop. Treatment of this condition includes vitamin B12 replacement for life. Contact your health care provider if you develop new symptoms or if your symptoms return or get worse. Keep all follow-up visits for treatment and monitoring of your condition. This information is not intended to replace advice given to you by your health care provider. Make sure you discuss any questions you have with your health care provider. Document Revised: 11/20/2021 Document Reviewed: 11/20/2021 Elsevier Patient Education  2024 [employer].

## 2024-07-31 LAB — INTRINSIC FACTOR ANTIBODIES: Intrinsic Factor Abs, Serum: 1.1 [AU]/ml (ref 0.0–1.1)

## 2024-10-28 ENCOUNTER — Other Ambulatory Visit

## 2025-02-08 ENCOUNTER — Encounter: Admitting: Family Medicine

## 2025-03-01 ENCOUNTER — Encounter: Admitting: Family Medicine

## 2025-05-04 ENCOUNTER — Ambulatory Visit
